# Patient Record
Sex: Female | Born: 1965 | Hispanic: No | Marital: Single | Smoking: Never smoker
Health system: Southern US, Community
[De-identification: ages and names within clinical notes are randomized; demographics above are authoritative.]

## PROBLEM LIST (undated history)

## (undated) ENCOUNTER — Emergency Department (HOSPITAL_COMMUNITY): Payer: 59

## (undated) DIAGNOSIS — I10 Essential (primary) hypertension: Secondary | ICD-10-CM

## (undated) DIAGNOSIS — R519 Headache, unspecified: Secondary | ICD-10-CM

## (undated) DIAGNOSIS — E785 Hyperlipidemia, unspecified: Secondary | ICD-10-CM

## (undated) DIAGNOSIS — N200 Calculus of kidney: Secondary | ICD-10-CM

## (undated) DIAGNOSIS — M1990 Unspecified osteoarthritis, unspecified site: Secondary | ICD-10-CM

## (undated) DIAGNOSIS — Z87442 Personal history of urinary calculi: Secondary | ICD-10-CM

## (undated) DIAGNOSIS — Z9289 Personal history of other medical treatment: Secondary | ICD-10-CM

## (undated) DIAGNOSIS — R51 Headache: Secondary | ICD-10-CM

## (undated) HISTORY — DX: Calculus of kidney: N20.0

## (undated) HISTORY — DX: Hyperlipidemia, unspecified: E78.5

## (undated) HISTORY — DX: Essential (primary) hypertension: I10

## (undated) HISTORY — PX: HERNIA REPAIR: SHX51

---

## 2000-12-05 HISTORY — PX: LIPOSUCTION: SHX10

## 2000-12-12 ENCOUNTER — Encounter (HOSPITAL_COMMUNITY): Admission: RE | Admit: 2000-12-12 | Discharge: 2001-01-15 | Payer: Self-pay | Admitting: *Deleted

## 2000-12-21 ENCOUNTER — Inpatient Hospital Stay (HOSPITAL_COMMUNITY): Admission: AD | Admit: 2000-12-21 | Discharge: 2000-12-21 | Payer: Self-pay | Admitting: Obstetrics

## 2001-01-06 ENCOUNTER — Inpatient Hospital Stay (HOSPITAL_COMMUNITY): Admission: AD | Admit: 2001-01-06 | Discharge: 2001-01-08 | Payer: Self-pay | Admitting: Obstetrics

## 2001-01-07 ENCOUNTER — Encounter: Payer: Self-pay | Admitting: Obstetrics

## 2001-01-09 ENCOUNTER — Inpatient Hospital Stay (HOSPITAL_COMMUNITY): Admission: AD | Admit: 2001-01-09 | Discharge: 2001-01-09 | Payer: Self-pay | Admitting: *Deleted

## 2001-01-11 ENCOUNTER — Inpatient Hospital Stay (HOSPITAL_COMMUNITY): Admission: AD | Admit: 2001-01-11 | Discharge: 2001-01-11 | Payer: Self-pay

## 2001-01-12 ENCOUNTER — Encounter (INDEPENDENT_AMBULATORY_CARE_PROVIDER_SITE_OTHER): Payer: Self-pay | Admitting: Specialist

## 2001-01-12 ENCOUNTER — Inpatient Hospital Stay (HOSPITAL_COMMUNITY): Admission: AD | Admit: 2001-01-12 | Discharge: 2001-01-15 | Payer: Self-pay | Admitting: Obstetrics

## 2001-01-17 ENCOUNTER — Inpatient Hospital Stay (HOSPITAL_COMMUNITY): Admission: AD | Admit: 2001-01-17 | Discharge: 2001-01-17 | Payer: Self-pay | Admitting: *Deleted

## 2007-08-13 ENCOUNTER — Emergency Department (HOSPITAL_COMMUNITY): Admission: EM | Admit: 2007-08-13 | Discharge: 2007-08-13 | Payer: Self-pay | Admitting: Emergency Medicine

## 2007-10-16 ENCOUNTER — Emergency Department (HOSPITAL_COMMUNITY): Admission: EM | Admit: 2007-10-16 | Discharge: 2007-10-17 | Payer: Self-pay | Admitting: Emergency Medicine

## 2009-07-10 ENCOUNTER — Emergency Department (HOSPITAL_COMMUNITY): Admission: EM | Admit: 2009-07-10 | Discharge: 2009-07-10 | Payer: Self-pay | Admitting: Emergency Medicine

## 2010-08-27 ENCOUNTER — Emergency Department (HOSPITAL_COMMUNITY): Admission: EM | Admit: 2010-08-27 | Discharge: 2010-08-27 | Payer: Self-pay | Admitting: Emergency Medicine

## 2011-03-16 ENCOUNTER — Other Ambulatory Visit (HOSPITAL_COMMUNITY): Payer: Self-pay | Admitting: Family Medicine

## 2011-03-16 ENCOUNTER — Other Ambulatory Visit: Payer: Self-pay | Admitting: Family Medicine

## 2011-03-16 DIAGNOSIS — Z1231 Encounter for screening mammogram for malignant neoplasm of breast: Secondary | ICD-10-CM

## 2011-03-22 ENCOUNTER — Ambulatory Visit (HOSPITAL_COMMUNITY)
Admission: RE | Admit: 2011-03-22 | Discharge: 2011-03-22 | Disposition: A | Discharge status: Home / Self Care | Payer: Medicaid Other | Source: Ambulatory Visit | Attending: Family Medicine | Admitting: Family Medicine

## 2011-03-22 DIAGNOSIS — Z1231 Encounter for screening mammogram for malignant neoplasm of breast: Secondary | ICD-10-CM | POA: Insufficient documentation

## 2011-04-22 NOTE — Discharge Summary (Signed)
[facility]  Patient:    Catherine Patrick, Catherine Patrick                         MRN: [id number] Adm. Date:  [id number] Disc. Date: [id number] Attending:  Antionette Char Dictator:   Zella Ball, M.D.                           Discharge Summary  DATE OF BIRTH:                [birth year]  DISCHARGE DIAGNOSES:          1. Intrauterine pregnancy at term, delivered.                               2. Fetal malpresentation--transverse lie.  PROCEDURES WHILE IN HOSPITAL:                     Low transverse cesarean section.  ADMISSION HISTORY:            This is a 45 year old G3, P2-0-02, at term who presents for a scheduled primary low transverse cesarean section due to fetal malpresentation. The patient had previously undergone two attempts at version of the fetus, which was verified to be in transverse lie by ultrasound, without success. Therefore, the patient was admitted for a C-section.  PHYSICAL EXAMINATION ON PRESENTATION:  VITAL SIGNS:                  Afebrile with normal vital signs.  GENERAL:                      Healthy female in no acute distress.  LUNGS:                        Clear to auscultation bilaterally. No rhonchi and wheezes.  CARDIAC:                      Regular rate and rhythm without murmurs, rubs, or gallops.  ABDOMEN:                      Soft, nontender. Positive bowel sounds. Gravid.  EXTREMITIES:                  Without clubbing, cyanosis, or edema.  SKIN:                         Within normal limits.  IMPRESSION AT ADMIT:          This was a 45 year old G3, P2-0-0-2 at term with intrauterine pregnancy in transverse lie, therefore, was admitted for cesarean section.  HOSPITAL COURSE:              On January 12, 2001 the patient underwent a low transverse cesarean section performed by Dr. Corky Sox assisted by Dr. Cyndia Bent and Dr. Candee Furbish. For a full discussion of that operation, please dictation of January 12, 2001. The  patient had an uncomplicated recovery and was discharged home on day three postoperatively.  FOLLOWUP:                     The patient is to return (1) in two days to the Maternity Admissions Unit for staple removal, and (2) to  return in six weeks to [facility].  DISCHARGE MEDICATIONS:        1. Micronor oral contraceptive pills.                               2. Prenatal vitamins q.d. while breast-feeding.                               3. Percocet for pain control                               4. Ibuprofen for pain control. DD:  01/15/01 TD:  01/15/01 Job: [id number]

## 2011-04-22 NOTE — Discharge Summary (Signed)
[facility]  Patient:    Catherine Patrick, Catherine Patrick                         MRN: [id number] Adm. Date:  [id number] Disc. Date: [id number] Attending:  Tammi Sou Dictator:   Zella Ball, M.D.                           Discharge Summary  DATE OF BIRTH:                June 11, 1966  DISCHARGE DIAGNOSES:          1. Intrauterine pregnancy, slightly postdates.                               2. Abnormal fetal presentation, transverse lie.  PROCEDURES WHILE IN HOSPITAL:                     Attempted fetal version x 1.  PRESENTING HISTORY:           This is a 45 year old, G3, P2-0-0-2, who presented at initially what we thought was 42 weeks 4 days gestation, last menstrual period March 28, 2000, the patient reporting an estimated due date of December 19, 2000, who came in complaining of vaginal pressure and burning with urination. The patient states she did no have contractions, no vaginal bleeding, no rupture of membranes, and could still feel fetal movement. The patient is a recent immigrant from [place], came to this country in December of last year and had had prenatal care in [place]. She had been seen twice previously at the maternity admissions unit, once by [facility] and another time she was seen, I believe, by Dr. Cyndia Bent.  OBSTETRICAL HISTORY:          The patient has had two prior normal spontaneous vaginal deliveries, one in 1994, one in 1998. Both of these, the patient stated, were postdates.  FAMILY AND SOCIAL HISTORY:    The patient is from [place]. Her husband is still in [place]. She is currently living her in [place] with friends. One of children is here, her younger child is here, and the oldest one is in [place] with the father.  PRENATAL LABORATORIES:        The patient is O positive blood type. Syphilis, HIV, Chlamydia, GBS negative on December 12, 2040.  INITIAL PRESENTATION:         The  patient was 1 cm dilated, about 50% effaced, and very, very posterior. Fetal heart rates were in the 130s and reactive with good variability. The patient was having contractions roughly every two to five minutes; however, was not feeling them.  Assessment at that time was that this was a postdates pregnancy and the patient was admitted for induction.  HOSPITAL COURSE:              The patient was started on low-dose Pitocin at 2 ______ per hour, as the patient began to feel her contractions, it was felt that Cervidil was not the most appropriate agent to use in this case. She was on Pitocin for roughly nine hours. The Pitocin continued without any change in the patients cervix and it was decided to go ahead and place the Cervidil which the patient had for a couple of hours with an increase  in her contractions to approximately every two minutes. It was felt that the patient was likely about to go into hyperstimulation; therefore, the Cervidil was pulled. On reexamining the patient in the morning, it was found that her cervix had not changed and no fetal presenting part could be felt in the pelvis. Therefore, a bedside ultrasound was done which showed the fetus to have a transverse presentation. She was then sent for full obstetrical ultrasound which showed the patient was indeed in a transverse lie; however, had within normal limits amniotic fluid and grade 2 placenta. Secondary to lie of the fetus, fetal version was attempted by Dr. Tamela Oddi without success. And, the patient was initially going to be sent home after this, but since she seemed to be contracting a little bit too much, she was kept for an additional day. Then, finally on day of discharge, January 08, 2001 the patients records, which initially could not be found, were found in the MAU and it was discovered that the patients due date was not January 15 but January 29, making her only 40 weeks and 5 days, rather than 42  weeks 5 days, which was our initial thought. Because of this, the patient was discharged home to see if the baby would turn on its own and scheduled to return to the MAU on Thursday, February 7 at 1:45 in order to have a second attempted version if the fetus has not moved itself, and then subsequent induction if the patient has not already gone into labor at that time.  DISCHARGE MEDICATIONS:        Prenatal vitamins.  FOLLOWUP:                     The patient is to return to the MAU on February 7, Thursday, at 1:45 for attempted version and induction. DD:  01/08/01 TD:  01/08/01 Job: [id number]

## 2011-04-22 NOTE — Op Note (Signed)
[facility]  Patient:    Catherine Patrick, Catherine Patrick                         MRN: [id number] Proc. Date: 01/12/01 Adm. Date:  [id number] Attending:  Tammi Sou Dictator:   Jamey Reas, M.D.                           Operative Report  DATE OF BIRTH:                06/15/1966  PREOPERATIVE DIAGNOSIS:       Term intrauterine pregnancy, transverse lie.  POSTOPERATIVE DIAGNOSIS:      Term intrauterine pregnancy, oblique lie.  PROCEDURE:                    Primary low transverse cesarean section via                               Pfannenstiel.  SURGEON:                      Conni Elliot, M.D.  ASSISTANT:                    Jamey Reas, M.D.                               Zella Ball, M.D.  FINDINGS:                     Viable female, Apgars 9 at one minute, 9 at five minutes, in oblique lie. Head delivered atraumatically at 1102 a.m. Cord pH 7.34. Nuchal cord x 2. Meconium-stained fluid.  COMPLICATIONS:                None.  INTRAVENOUS FLUIDS:           2600 cc lactated Ringers.  ANESTHESIA:                   Spinal.  ESTIMATED BLOOD LOSS:         800 cc.  URINE OUTPUT:                 425 cc, clear.  CONDITION:                    Stable.  DESCRIPTION OF PROCEDURE:     The patient was taken to the operating room where spinal anesthesia was introduced and found to be adequate. She was then prepped and draped in the normal sterile fashion in the dorsal supine position with a leftward tilt. A Pfannenstiel skin incision was then made with the scalpel and carried through to the underlying layer of fascia with the scalpel. The fascia was incised in the midline and the incision extended laterally with the Mayo scissors. Superior aspect of the fascial incision was then grasped with Kocher clamps, elevated, and the underlying rectus muscles dissected off bluntly. Attention was then turned to the inferior aspect of the incision  which, in a similar fashion, was grasped, tented up with the Kocher clamps, and the rectus muscles dissected off bluntly. The rectus muscles were then separated in the midline, the peritoneum identified, tented up and entered sharply with Metzenbaum scissors. The peritoneal incision was  then extended superiorly and inferiorly with good visualization of the bladder. The bladder blade was then inserted and the vesicouterine peritoneum identified, grasped with pickups, and entered sharply with Metzenbaum scissors. This incision was then extended laterally and the bladder flap created digitally. The bladder blade was then reinserted and the lower uterine segment incised in a transverse fashion with the scalpel. The uterine incision was then extended laterally with blunt dissection. The bladder blade was removed and the infants head delivered atraumatically. The nose and mouth were suctioned with the DeLee suction trap and the cord clamped and cut. The infant was handed off to the awaiting pediatricians and cord gases were sent. Cord pH was 7.27. The placenta was then removed manually, the uterus exteriorized and cleared of all clots and debris. The uterine incision was repaired with 1-0 chromic in a running lock fashion. The second layer of the same suture was used to obtain excellent hemostasis. The bladder flap was repaired with 2-0 chromic suture on a SH needle in a running stitch and the uterus returned to the abdomen. The gutters were cleared of all clots and irrigated thoroughly, and the peritoneum closed with 2-0 chromic suture.  The fascia was reapproximated with 0 Vicryl in a running fashion. The subcutaneous tissue was irrigated thoroughly. It was closed with running stitch of 2-0 plain gut on a large needle. The skin was closed with staples. The patient tolerated the procedure well. Sponge, lap, and needle counts were correct x 2. One gram of Cefotan was given at cord clamp. The  patient was taken to the recovery room in stable condition. DD:  01/12/01 TD:  01/12/01 Job: [id number]

## 2011-08-30 ENCOUNTER — Emergency Department (HOSPITAL_COMMUNITY)
Admission: EM | Admit: 2011-08-30 | Discharge: 2011-08-30 | Disposition: A | Discharge status: Home / Self Care | Payer: Self-pay | Attending: Emergency Medicine | Admitting: Emergency Medicine

## 2011-08-30 ENCOUNTER — Emergency Department (HOSPITAL_COMMUNITY): Payer: Self-pay

## 2011-08-30 DIAGNOSIS — S93609A Unspecified sprain of unspecified foot, initial encounter: Secondary | ICD-10-CM | POA: Insufficient documentation

## 2011-08-30 DIAGNOSIS — IMO0002 Reserved for concepts with insufficient information to code with codable children: Secondary | ICD-10-CM | POA: Insufficient documentation

## 2011-08-30 DIAGNOSIS — M79609 Pain in unspecified limb: Secondary | ICD-10-CM | POA: Insufficient documentation

## 2011-09-13 LAB — PREGNANCY, URINE
Preg Test, Ur: NEGATIVE
Preg Test, Ur: NEGATIVE

## 2011-09-13 LAB — URINALYSIS, ROUTINE W REFLEX MICROSCOPIC
Bilirubin Urine: NEGATIVE
Glucose, UA: NEGATIVE
Nitrite: NEGATIVE
Specific Gravity, Urine: 1.022
pH: 6

## 2011-09-13 LAB — URINE MICROSCOPIC-ADD ON

## 2011-09-13 LAB — URINE CULTURE

## 2011-12-31 ENCOUNTER — Emergency Department (HOSPITAL_COMMUNITY)
Admission: EM | Admit: 2011-12-31 | Discharge: 2011-12-31 | Disposition: A | Discharge status: Home / Self Care | Payer: Self-pay | Attending: Emergency Medicine | Admitting: Emergency Medicine

## 2011-12-31 ENCOUNTER — Emergency Department (HOSPITAL_COMMUNITY): Payer: Self-pay

## 2011-12-31 ENCOUNTER — Encounter (HOSPITAL_COMMUNITY): Payer: Self-pay | Admitting: *Deleted

## 2011-12-31 DIAGNOSIS — W2203XA Walked into furniture, initial encounter: Secondary | ICD-10-CM | POA: Insufficient documentation

## 2011-12-31 DIAGNOSIS — M79609 Pain in unspecified limb: Secondary | ICD-10-CM | POA: Insufficient documentation

## 2011-12-31 DIAGNOSIS — S92502A Displaced unspecified fracture of left lesser toe(s), initial encounter for closed fracture: Secondary | ICD-10-CM

## 2011-12-31 DIAGNOSIS — S92919A Unspecified fracture of unspecified toe(s), initial encounter for closed fracture: Secondary | ICD-10-CM | POA: Insufficient documentation

## 2011-12-31 MED ORDER — HYDROCODONE-ACETAMINOPHEN 5-325 MG PO TABS
1.0000 | ORAL_TABLET | Freq: Once | ORAL | Status: AC
  Administered 2011-12-31: 1 via ORAL
  Filled 2011-12-31: qty 1

## 2011-12-31 MED ORDER — HYDROCODONE-ACETAMINOPHEN 5-325 MG PO TABS: 1.0000 | ORAL_TABLET | Freq: Once | ORAL | Status: AC

## 2011-12-31 NOTE — ED Notes (Signed)
Pt from home c/o left, 5th toe pain after hitting over end table, deformity and swelling noted.

## 2011-12-31 NOTE — ED Provider Notes (Signed)
History     CSN: [id number]  Arrival date & time 12/31/11  1437   First MD Initiated Contact with Patient 12/31/11 1630      4:57 PM HPI Pt reports she hit her left 5th toe on a table. Reports severe pain and difficulty walking on it. Denies wound or laceration.  Patient is a 46 y.o. female presenting with toe pain. The history is provided by the patient.  Toe Pain This is a new problem. The current episode started today. The problem occurs constantly. The problem has been unchanged. Associated symptoms include joint swelling. Pertinent negatives include no numbness or weakness. The symptoms are aggravated by walking and standing. She has tried nothing for the symptoms.    History reviewed. No pertinent past medical history.  Past Surgical History  Procedure Date  . Hernia repair     History reviewed. No pertinent family history.  History  Substance Use Topics  . Smoking status: Never Smoker   . Smokeless tobacco: Never Used  . Alcohol Use: No    OB History    Grav Para Term Preterm Abortions TAB SAB Ect Mult Living                  Review of Systems  Musculoskeletal: Positive for joint swelling.       Toe pain and swelling   Skin: Negative for wound.  Neurological: Negative for weakness and numbness.  All other systems reviewed and are negative.    Allergies  Review of patient's allergies indicates no known allergies.  Home Medications  No current outpatient prescriptions on file.  BP 139/90  Pulse 79  Temp(Src) 97.9 F (36.6 C) (Oral)  Resp 18  Ht 4' 10.27" (1.48 m)  Wt 162 lb (73.483 kg)  BMI 33.55 kg/m2  SpO2 99%  LMP 11/24/2011  Physical Exam  Vitals reviewed. Constitutional: She is oriented to person, place, and time. Vital signs are normal. She appears well-developed and well-nourished. No distress.  HENT:  Head: Normocephalic and atraumatic.  Eyes: Pupils are equal, round, and reactive to light.  Neck: Neck supple.  Pulmonary/Chest:  Effort normal.  Musculoskeletal:       Left 5th toe tenderness with Palpation. Able to wiggle toes slightly but patient is uncooperative exam and does not allow me to check cap refill or sensation. Mild edema and slight lateral deviation. Pt reports normal sensation.  Neurological: She is alert and oriented to person, place, and time.  Skin: Skin is warm and dry. No rash noted. No erythema. No pallor.  Psychiatric: She has a normal mood and affect. Her behavior is normal.    ED Course  Procedures  Dg Toe 5th Left  12/31/2011  *RADIOLOGY REPORT*  Clinical Data: Left fifth digit injury  DG TOE 5TH LEFT  Comparison: Plain film 08/30/2011  Findings: There is a horizontal fracture through the metaphysis of the proximal phalanx of the fifth digit with lateral angulation. Fracture does not appear to enter the articular surface.  IMPRESSION: Fracture of proximal phalanx of the fifth digit.  Original Report Authenticated By: Genevive Bi, M.D.    MDM  Will place patient in a post-op shoe and buddy tape toes. Pt refusing crutches. States she has crutches at home. Will also provide referral to Dr. Selena Batten, PA-C 12/31/11 1732

## 2012-01-01 NOTE — ED Provider Notes (Signed)
Medical screening examination/treatment/procedure(s) were performed by non-physician practitioner and as supervising physician I was immediately available for consultation/collaboration.  Brandye Inthavong, MD 01/01/12 1035 

## 2013-01-01 ENCOUNTER — Emergency Department (HOSPITAL_BASED_OUTPATIENT_CLINIC_OR_DEPARTMENT_OTHER)
Admission: EM | Admit: 2013-01-01 | Discharge: 2013-01-01 | Disposition: A | Discharge status: Home / Self Care | Payer: No Typology Code available for payment source | Attending: Emergency Medicine | Admitting: Emergency Medicine

## 2013-01-01 ENCOUNTER — Emergency Department (HOSPITAL_BASED_OUTPATIENT_CLINIC_OR_DEPARTMENT_OTHER): Payer: No Typology Code available for payment source

## 2013-01-01 ENCOUNTER — Encounter (HOSPITAL_BASED_OUTPATIENT_CLINIC_OR_DEPARTMENT_OTHER): Payer: Self-pay

## 2013-01-01 DIAGNOSIS — R51 Headache: Secondary | ICD-10-CM | POA: Insufficient documentation

## 2013-01-01 LAB — COMPREHENSIVE METABOLIC PANEL
BUN: 15 mg/dL (ref 6–23)
CO2: 26 mEq/L (ref 19–32)
Chloride: 104 mEq/L (ref 96–112)
Creatinine, Ser: 0.7 mg/dL (ref 0.50–1.10)
GFR calc Af Amer: >90 mL/min (ref >90)
GFR calc non Af Amer: >90 mL/min (ref >90)
Glucose, Bld: 108 mg/dL — ABNORMAL HIGH (ref 70–99)
Total Bilirubin: 0.1 mg/dL — ABNORMAL LOW (ref 0.3–1.2)

## 2013-01-01 LAB — CBC WITH DIFFERENTIAL/PLATELET
HCT: 43.3 % (ref 36.0–46.0)
Hemoglobin: 14.3 g/dL (ref 12.0–15.0)
Lymphocytes Relative: 33 % (ref 12–46)
MCV: 90 fL (ref 78.0–100.0)
Monocytes Absolute: 0.7 10*3/uL (ref 0.1–1.0)
Monocytes Relative: 8 % (ref 3–12)
Neutro Abs: 4.4 10*3/uL (ref 1.7–7.7)
WBC: 7.8 10*3/uL (ref 4.0–10.5)

## 2013-01-01 LAB — PREGNANCY, URINE: Preg Test, Ur: NEGATIVE

## 2013-01-01 LAB — SEDIMENTATION RATE: Sed Rate: 10 mm/hr (ref 0–22)

## 2013-01-01 MED ORDER — SODIUM CHLORIDE 0.9 % IV BOLUS (SEPSIS)
1000.0000 mL | Freq: Once | INTRAVENOUS | Status: AC
  Administered 2013-01-01: 1000 mL via INTRAVENOUS

## 2013-01-01 MED ORDER — CYCLOBENZAPRINE HCL 10 MG PO TABS: 10.0000 mg | ORAL_TABLET | Freq: Two times a day (BID) | ORAL | Status: DC | PRN

## 2013-01-01 MED ORDER — METOCLOPRAMIDE HCL 10 MG PO TABS: 10.0000 mg | ORAL_TABLET | Freq: Four times a day (QID) | ORAL | Status: DC | PRN

## 2013-01-01 MED ORDER — METOCLOPRAMIDE HCL 5 MG/ML IJ SOLN
10.0000 mg | Freq: Once | INTRAMUSCULAR | Status: AC
  Administered 2013-01-01: 10 mg via INTRAVENOUS
  Filled 2013-01-01: qty 2

## 2013-01-01 MED ORDER — DIPHENHYDRAMINE HCL 50 MG/ML IJ SOLN
25.0000 mg | Freq: Once | INTRAMUSCULAR | Status: AC
  Administered 2013-01-01: 18:00:00 via INTRAVENOUS
  Filled 2013-01-01: qty 1

## 2013-01-01 NOTE — ED Provider Notes (Signed)
History     CSN: [id number]  Arrival date & time 01/01/13  1623   First MD Initiated Contact with Patient 01/01/13 1646      Chief Complaint  Patient presents with  . Headache    (Consider location/radiation/quality/duration/timing/severity/associated sxs/prior treatment) Patient is a 47 y.o. female presenting with headaches. The history is provided by the patient.  Headache   She complains of a left-sided headache for the last month. It is getting worse. Pain is rated at 10/10 except when she takes analgesics at which time the pain drops to 5/10. Pain is constant she describes as as dull and throbbing. There has been intermittent blurring of vision. She denies fever or chills. She denies nausea or vomiting. She denies photophobia and phonophobia. Headache does interfere with sleep and does wake her up. She has taken a variety of over-the-counter pain medications each which gave partial relief.  History reviewed. No pertinent past medical history.  Past Surgical History  Procedure Date  . Hernia repair     No family history on file.  History  Substance Use Topics  . Smoking status: Never Smoker   . Smokeless tobacco: Never Used  . Alcohol Use: No    OB History    Grav Para Term Preterm Abortions TAB SAB Ect Mult Living                  Review of Systems  Neurological: Positive for headaches.  All other systems reviewed and are negative.    Allergies  Review of patient's allergies indicates no known allergies.  Home Medications  No current outpatient prescriptions on file.  BP 135/91  Pulse 84  Temp 98.3 F (36.8 C) (Oral)  Resp 18  Wt 170 lb (77.111 kg)  SpO2 98%  LMP 11/04/2012  Physical Exam  Nursing note and vitals reviewed.  47 year old female, resting comfortably and in no acute distress. Vital signs are significant for hypertension with blood pressure 162/110. Oxygen saturation is 100%, which is normal. Head is normocephalic and atraumatic.  PERRLA, EOMI. Oropharynx is clear.fundi show no hemorrhage, exudate, or papilledema. There is tenderness to palpation over the temporalis muscle bilaterally and over the left temporal artery. There's also tenderness to palpation over the insertion of the paracervical muscles. Neck is nontender and supple without adenopathy or JVD. Back is nontender and there is no CVA tenderness. Lungs are clear without rales, wheezes, or rhonchi. Chest is nontender. Heart has regular rate and rhythm without murmur. Abdomen is soft, flat, nontender without masses or hepatosplenomegaly and peristalsis is normoactive. Extremities have no cyanosis or edema, full range of motion is present. Skin is warm and dry without rash. Neurologic: Mental status is normal, cranial nerves are intact, there are no motor or sensory deficits.  ED Course  Procedures (including critical care time)  Results for orders placed during the hospital encounter of 01/01/13  CBC WITH DIFFERENTIAL      Component Value Range   WBC 7.8  4.0 - 10.5 K/uL   RBC 4.81  3.87 - 5.11 MIL/uL   Hemoglobin 14.3  12.0 - 15.0 g/dL   HCT 78.2  95.6 - 21.3 %   MCV 90.0  78.0 - 100.0 fL   MCH 29.7  26.0 - 34.0 pg   MCHC 33.0  30.0 - 36.0 g/dL   RDW 08.6  57.8 - 46.9 %   Platelets 207  150 - 400 K/uL   Neutrophils Relative 57  43 - 77 %  Neutro Abs 4.4  1.7 - 7.7 K/uL   Lymphocytes Relative 33  12 - 46 %   Lymphs Abs 2.6  0.7 - 4.0 K/uL   Monocytes Relative 8  3 - 12 %   Monocytes Absolute 0.7  0.1 - 1.0 K/uL   Eosinophils Relative 1  0 - 5 %   Eosinophils Absolute 0.1  0.0 - 0.7 K/uL   Basophils Relative 0  0 - 1 %   Basophils Absolute 0.0  0.0 - 0.1 K/uL  COMPREHENSIVE METABOLIC PANEL      Component Value Range   Sodium 141  135 - 145 mEq/L   Potassium 4.1  3.5 - 5.1 mEq/L   Chloride 104  96 - 112 mEq/L   CO2 26  19 - 32 mEq/L   Glucose, Bld 108 (*) 70 - 99 mg/dL   BUN 15  6 - 23 mg/dL   Creatinine, Ser 1.61  0.50 - 1.10 mg/dL    Calcium 9.8  8.4 - 09.6 mg/dL   Total Protein 7.1  6.0 - 8.3 g/dL   Albumin 3.5  3.5 - 5.2 g/dL   AST 18  0 - 37 U/L   ALT 19  0 - 35 U/L   Alkaline Phosphatase 121 (*) 39 - 117 U/L   Total Bilirubin 0.1 (*) 0.3 - 1.2 mg/dL   GFR calc non Af Amer >90  >90 mL/min   GFR calc Af Amer >90  >90 mL/min  SEDIMENTATION RATE      Component Value Range   Sed Rate 10  0 - 22 mm/hr  PREGNANCY, URINE      Component Value Range   Preg Test, Ur NEGATIVE  NEGATIVE   Ct Head Wo Contrast  01/01/2013  *RADIOLOGY REPORT*  Clinical Data: Left sided headache. No injury.  CT HEAD WITHOUT CONTRAST  Technique:  Contiguous axial images were obtained from the base of the skull through the vertex without contrast.  Comparison: None.  Findings: There is no evidence for acute infarction, intracranial hemorrhage, mass lesion, hydrocephalus, or extra-axial fluid. There is no atrophy or white matter disease.  The calvarium is intact.  The paranasal sinuses and mastoids are clear.  IMPRESSION: Negative exam.   Original Report Authenticated By: Davonna Belling, M.D.    Images viewed by me.  1. Headache       MDM  Headache of uncertain cause. Clinicity and unilateral nature her worrisome. Although she is fairly young, need to consider possibility of temporal arteritis and sedimentation rate has been sent. She will be sent for CT scan.  Workup is negative including normal CT scan and normal sedimentation rate. She was given IV fluids, IV metoclopramide, IV diphenhydramine with good relief of her headache. At this point, it seems most likely that the headache is right muscle contraction headache. She is sent home with prescription for bromide and cyclobenzaprine and is referred to [facility] for further workup if symptoms are not improved with the above-noted medication.  Dione Booze, MD 01/01/13 [id number]

## 2013-01-01 NOTE — ED Notes (Signed)
C/o HA x 1 month

## 2013-01-21 ENCOUNTER — Encounter (HOSPITAL_COMMUNITY): Payer: Self-pay

## 2013-01-21 ENCOUNTER — Emergency Department (INDEPENDENT_AMBULATORY_CARE_PROVIDER_SITE_OTHER)
Admission: EM | Admit: 2013-01-21 | Discharge: 2013-01-21 | Disposition: A | Discharge status: Home / Self Care | Payer: No Typology Code available for payment source | Source: Home / Self Care | Attending: Family Medicine | Admitting: Family Medicine

## 2013-01-21 DIAGNOSIS — I10 Essential (primary) hypertension: Secondary | ICD-10-CM

## 2013-01-21 DIAGNOSIS — R51 Headache: Secondary | ICD-10-CM

## 2013-01-21 MED ORDER — CETIRIZINE HCL 10 MG PO TABS: 10.0000 mg | ORAL_TABLET | Freq: Every day | ORAL | Status: DC

## 2013-01-21 MED ORDER — LISINOPRIL-HYDROCHLOROTHIAZIDE 10-12.5 MG PO TABS: 1.0000 | ORAL_TABLET | Freq: Every day | ORAL | Status: DC

## 2013-01-21 NOTE — ED Provider Notes (Signed)
History    CSN: [id number]  Arrival date & time 01/21/13  1748   First MD Initiated Contact with Patient 01/21/13 1858     Chief Complaint  Patient presents with  . Headache   The history is provided by the patient.   Pt is here to follow up from recent ER visit for headache.  Pt says that she was diagnosed with tension headaches.  Her blood pressure was also found to be elevated at the time.  She didn't get any treatment for her hypertension at that time.  She is reporting today that she's continuing to have headaches.  She reports that they come on throughout the day and sometimes at night.  The patient reports that she's had no nausea or vomiting.  No photophobia.  The patient reports that she has no dizziness.  The patient denies frequent urination.  She also had an elevated blood sugar when they did some blood work on her in the emergency department.  She reports that she has no history of prediabetes or diabetes mellitus.  The patient reports that she has been having some allergy symptoms as well.  She is having some postnasal drainage.  The patient reports that she has not had a period in the last 2 months.  She reports that she had a negative pregnancy test at home.  History reviewed. No pertinent past medical history.  Past Surgical History  Procedure Laterality Date  . Hernia repair      No family history on file.  History  Substance Use Topics  . Smoking status: Never Smoker   . Smokeless tobacco: Never Used  . Alcohol Use: No    OB History   Grav Para Term Preterm Abortions TAB SAB Ect Mult Living                 Review of Systems  Constitutional: Positive for fatigue.  HENT: Positive for congestion, rhinorrhea and postnasal drip.   Endocrine: Negative for polyuria.  Neurological: Positive for headaches.  All other systems reviewed and are negative.   Allergies  Review of patient's allergies indicates no known allergies.  Home Medications   Current  Outpatient Rx  Name  Route  Sig  Dispense  Refill  . cyclobenzaprine (FLEXERIL) 10 MG tablet   Oral   Take 1 tablet (10 mg total) by mouth 2 (two) times daily as needed for muscle spasms.   20 tablet   0   . metoCLOPramide (REGLAN) 10 MG tablet   Oral   Take 1 tablet (10 mg total) by mouth every 6 (six) hours as needed (nausea or headache).   30 tablet   0     BP 148/106  Pulse 89  Temp(Src) 98.2 F (36.8 C) (Oral)  SpO2 100%  LMP 11/04/2012  Physical Exam  Nursing note and vitals reviewed. Constitutional: She is oriented to person, place, and time. She appears well-developed and well-nourished. No distress.  HENT:  Head: Normocephalic and atraumatic.  Right Ear: External ear normal.  Left Ear: External ear normal.  Nose: Mucosal edema and rhinorrhea present.  Eyes: Conjunctivae and EOM are normal. Pupils are equal, round, and reactive to light. Left eye exhibits no discharge.  Neck: Normal range of motion. Neck supple. No JVD present. No tracheal deviation present. No thyromegaly present.  Cardiovascular: Normal rate, regular rhythm and normal heart sounds.   No murmur heard. Pulmonary/Chest: Effort normal and breath sounds normal. No respiratory distress. She has no wheezes. She has  no rales. She exhibits no tenderness.  Abdominal: Soft. Bowel sounds are normal. She exhibits no distension and no mass. There is no tenderness. There is no rebound and no guarding.  Musculoskeletal: Normal range of motion. She exhibits no edema and no tenderness.  Lymphadenopathy:    She has no cervical adenopathy.  Neurological: She is alert and oriented to person, place, and time. She displays normal reflexes. No cranial nerve deficit. She exhibits normal muscle tone. Coordination normal.  Skin: Skin is warm and dry. No rash noted. No erythema. No pallor.  Psychiatric: She has a normal mood and affect. Her behavior is normal. Judgment and thought content normal.    ED Course  Procedures  (including critical care time)  Labs Reviewed  HEMOGLOBIN A1C   No results found.  No diagnosis found.  MDM  IMPRESSION  Hypertension  Headaches  Hyperglycemia  Dysmenorrhea  RECOMMENDATIONS / PLAN Treat hypertension: Start zestoretic 10/12.5 - take 1 po daily-possible side effects reviewed with patient today Check labs today : I like to check an A1c to be sure that she does not have prediabetes. I gave the patient information on diet and exercise regarding hypertension. I asked the patient to keep a headache diary and to monitor her diet consumption, headache frequency, timing, menstrual cycles  FOLLOW UP 2 weeks for blood pressure followup and to assess headaches and check headache diary  The patient was given clear instructions to go to ER or return to medical center if symptoms don't improve, worsen or new problems develop.  The patient verbalized understanding.  The patient was told to call to get lab results if they haven't heard anything in the next week.            Cleora Fleet, MD 01/21/13 2036

## 2013-01-21 NOTE — ED Notes (Signed)
Patient states went to the ed 01/01/2013 for symptoms associated with a headache Doctor( neuro) who the ed referred her to does not accept the orange card Today still is having headache

## 2013-01-22 ENCOUNTER — Telehealth (HOSPITAL_COMMUNITY): Payer: Self-pay

## 2013-01-22 NOTE — Progress Notes (Signed)
Quick Note:  Please notify patient that her hemoglobin A1c came back elevated at 5.8% which suggests she does have prediabetes. Please mail the patient information about prediabetes and exercise and physical activity. I like for the patient to exercise 150 minutes per week to try and avoid becoming a full diabetic. I'd like to have her labs rechecked again in 3 months.  Rodney Langton, MD, CDE, FAAFP [facility] [place], [place]   ______

## 2013-03-20 ENCOUNTER — Encounter (HOSPITAL_COMMUNITY): Payer: Self-pay | Admitting: Emergency Medicine

## 2013-03-20 ENCOUNTER — Emergency Department (INDEPENDENT_AMBULATORY_CARE_PROVIDER_SITE_OTHER)
Admission: EM | Admit: 2013-03-20 | Discharge: 2013-03-20 | Disposition: A | Discharge status: Home / Self Care | Payer: No Typology Code available for payment source | Source: Home / Self Care | Attending: Emergency Medicine | Admitting: Emergency Medicine

## 2013-03-20 DIAGNOSIS — J069 Acute upper respiratory infection, unspecified: Secondary | ICD-10-CM

## 2013-03-20 DIAGNOSIS — J209 Acute bronchitis, unspecified: Secondary | ICD-10-CM

## 2013-03-20 MED ORDER — ALBUTEROL SULFATE HFA 108 (90 BASE) MCG/ACT IN AERS: 1.0000 | INHALATION_SPRAY | Freq: Four times a day (QID) | RESPIRATORY_TRACT | Status: DC | PRN

## 2013-03-20 MED ORDER — PREDNISONE 20 MG PO TABS: 20.0000 mg | ORAL_TABLET | Freq: Two times a day (BID) | ORAL | Status: DC

## 2013-03-20 MED ORDER — AMOXICILLIN 500 MG PO CAPS: 1000.0000 mg | ORAL_CAPSULE | Freq: Three times a day (TID) | ORAL | Status: DC

## 2013-03-20 MED ORDER — ACETAMINOPHEN 325 MG PO TABS
ORAL_TABLET | ORAL | Status: AC
  Filled 2013-03-20: qty 2

## 2013-03-20 MED ORDER — BENZONATATE 200 MG PO CAPS: 200.0000 mg | ORAL_CAPSULE | Freq: Three times a day (TID) | ORAL | Status: DC | PRN

## 2013-03-20 MED ORDER — ACETAMINOPHEN 325 MG PO TABS
650.0000 mg | ORAL_TABLET | Freq: Once | ORAL | Status: AC
  Administered 2013-03-20: 650 mg via ORAL

## 2013-03-20 NOTE — ED Provider Notes (Signed)
Chief Complaint:   Chief Complaint  Patient presents with  . Headache    History of Present Illness:   Hera Celaya is a 47 year old female who presents with a one-week history of cough productive yellow sputum, wheezing, chest pain when she coughs, subjective fever, chills, sweats, headache, nasal congestion with yellow rhinorrhea, sinus pressure, abdominal pain, ear congestion, and sore throat. She denies pleuritic chest pain, vomiting, or diarrhea.  Review of Systems:  Other than noted above, the patient denies any of the following symptoms: Systemic:  No fevers, chills, sweats, weight loss or gain, fatigue, or tiredness. Eye:  No redness or discharge. ENT:  No ear pain, drainage, headache, nasal congestion, drainage, sinus pressure, difficulty swallowing, or sore throat. Neck:  No neck pain or swollen glands. Lungs:  No cough, sputum production, hemoptysis, wheezing, chest tightness, shortness of breath or chest pain. GI:  No abdominal pain, nausea, vomiting or diarrhea.  PMFSH:  Past medical history, family history, social history, meds, and allergies were reviewed.  Physical Exam:   Vital signs:  BP 134/84  Pulse 110  Temp(Src) 98.4 F (36.9 C) (Oral)  Resp 20  SpO2 98% General:  Alert and oriented.  In no distress.  Skin warm and dry. Eye:  No conjunctival injection or drainage. Lids were normal. ENT:  TMs and canals were normal, without erythema or inflammation.  Nasal mucosa was clear and uncongested, without drainage.  Mucous membranes were moist.  Pharynx was clear with no exudate or drainage.  There were no oral ulcerations or lesions. Neck:  Supple, no adenopathy, tenderness or mass. Lungs:  No respiratory distress.  Lungs were clear to auscultation, without wheezes, rales or rhonchi.  Breath sounds were clear and equal bilaterally.  Heart:  Regular rhythm, without gallops, murmers or rubs. Skin:  Clear, warm, and dry, without rash or lesions.  Assessment:  The primary  encounter diagnosis was Viral upper respiratory infection. A diagnosis of Acute bronchitis was also pertinent to this visit.  No evidence for pneumonia.  Plan:   1.  The following meds were prescribed:   New Prescriptions   ALBUTEROL (PROVENTIL HFA;VENTOLIN HFA) 108 (90 BASE) MCG/ACT INHALER    Inhale 1-2 puffs into the lungs every 6 (six) hours as needed for wheezing.   AMOXICILLIN (AMOXIL) 500 MG CAPSULE    Take 2 capsules (1,000 mg total) by mouth 3 (three) times daily.   BENZONATATE (TESSALON) 200 MG CAPSULE    Take 1 capsule (200 mg total) by mouth 3 (three) times daily as needed for cough.   PREDNISONE (DELTASONE) 20 MG TABLET    Take 1 tablet (20 mg total) by mouth 2 (two) times daily.   2.  The patient was instructed in symptomatic care and handouts were given. 3.  The patient was told to return if becoming worse in any way, if no better in 3 or 4 days, and given some red flag symptoms such as worsening fever, increasing pain, or difficulty breathing that would indicate earlier return.      Reuben Likes, MD 03/20/13 1059

## 2013-03-20 NOTE — ED Notes (Signed)
Fever and headache and cough, onset Thursday.  Evaluated by dr Lorenz Coaster prior to this nurse

## 2013-05-28 ENCOUNTER — Emergency Department (HOSPITAL_COMMUNITY): Payer: No Typology Code available for payment source

## 2013-05-28 ENCOUNTER — Emergency Department (HOSPITAL_COMMUNITY)
Admission: EM | Admit: 2013-05-28 | Discharge: 2013-05-28 | Disposition: A | Discharge status: Home / Self Care | Payer: No Typology Code available for payment source | Attending: Emergency Medicine | Admitting: Emergency Medicine

## 2013-05-28 ENCOUNTER — Encounter (HOSPITAL_COMMUNITY): Payer: Self-pay | Admitting: *Deleted

## 2013-05-28 DIAGNOSIS — Z3202 Encounter for pregnancy test, result negative: Secondary | ICD-10-CM | POA: Insufficient documentation

## 2013-05-28 DIAGNOSIS — R112 Nausea with vomiting, unspecified: Secondary | ICD-10-CM | POA: Insufficient documentation

## 2013-05-28 DIAGNOSIS — N200 Calculus of kidney: Secondary | ICD-10-CM | POA: Insufficient documentation

## 2013-05-28 DIAGNOSIS — Z8719 Personal history of other diseases of the digestive system: Secondary | ICD-10-CM | POA: Insufficient documentation

## 2013-05-28 LAB — POCT PREGNANCY, URINE: Preg Test, Ur: NEGATIVE

## 2013-05-28 LAB — CBC WITH DIFFERENTIAL/PLATELET
Basophils Absolute: 0 10*3/uL (ref 0.0–0.1)
Basophils Relative: 0 % (ref 0–1)
Eosinophils Relative: 1 % (ref 0–5)
HCT: 39 % (ref 36.0–46.0)
Lymphocytes Relative: 40 % (ref 12–46)
MCHC: 32.3 g/dL (ref 30.0–36.0)
MCV: 89.4 fL (ref 78.0–100.0)
Monocytes Absolute: 0.8 10*3/uL (ref 0.1–1.0)
Platelets: 190 10*3/uL (ref 150–400)
RDW: 14 % (ref 11.5–15.5)
WBC: 10.6 10*3/uL — ABNORMAL HIGH (ref 4.0–10.5)

## 2013-05-28 LAB — COMPREHENSIVE METABOLIC PANEL
ALT: 24 U/L (ref 0–35)
AST: 19 U/L (ref 0–37)
Albumin: 3.2 g/dL — ABNORMAL LOW (ref 3.5–5.2)
CO2: 24 mEq/L (ref 19–32)
Calcium: 8.9 mg/dL (ref 8.4–10.5)
Creatinine, Ser: 0.87 mg/dL (ref 0.50–1.10)
Sodium: 140 mEq/L (ref 135–145)
Total Protein: 6.8 g/dL (ref 6.0–8.3)

## 2013-05-28 LAB — URINE MICROSCOPIC-ADD ON

## 2013-05-28 LAB — URINALYSIS, ROUTINE W REFLEX MICROSCOPIC
Bilirubin Urine: NEGATIVE
Glucose, UA: NEGATIVE mg/dL
Specific Gravity, Urine: 1.024 (ref 1.005–1.030)
Urobilinogen, UA: 0.2 mg/dL (ref 0.0–1.0)
pH: 5 (ref 5.0–8.0)

## 2013-05-28 MED ORDER — ONDANSETRON HCL 4 MG/2ML IJ SOLN
4.0000 mg | Freq: Once | INTRAMUSCULAR | Status: AC
  Administered 2013-05-28: 4 mg via INTRAVENOUS
  Filled 2013-05-28: qty 2

## 2013-05-28 MED ORDER — ONDANSETRON HCL 4 MG PO TABS: 4.0000 mg | ORAL_TABLET | Freq: Four times a day (QID) | ORAL | Status: DC

## 2013-05-28 MED ORDER — OXYCODONE-ACETAMINOPHEN 5-325 MG PO TABS: 1.0000 | ORAL_TABLET | ORAL | Status: DC | PRN

## 2013-05-28 MED ORDER — MORPHINE SULFATE 4 MG/ML IJ SOLN
INTRAMUSCULAR | Status: AC
  Administered 2013-05-28: 4 mg via INTRAVENOUS
  Filled 2013-05-28: qty 1

## 2013-05-28 MED ORDER — KETOROLAC TROMETHAMINE 30 MG/ML IJ SOLN
30.0000 mg | Freq: Once | INTRAMUSCULAR | Status: AC
  Administered 2013-05-28: 30 mg via INTRAVENOUS
  Filled 2013-05-28: qty 1

## 2013-05-28 MED ORDER — MORPHINE SULFATE 4 MG/ML IJ SOLN
4.0000 mg | Freq: Once | INTRAMUSCULAR | Status: AC
  Administered 2013-05-28: 4 mg via INTRAVENOUS
  Filled 2013-05-28: qty 1

## 2013-05-28 MED ORDER — MORPHINE SULFATE 4 MG/ML IJ SOLN: 4.0000 mg | Freq: Once | INTRAMUSCULAR | Status: DC

## 2013-05-28 NOTE — ED Notes (Signed)
Patient transported to CT 

## 2013-05-28 NOTE — Progress Notes (Signed)
[facility] has seen patient. Patient does have a orange card and is currently a patient at [facility] and [facility].

## 2013-05-28 NOTE — ED Notes (Signed)
Pt c/o right flank pain and abd pain with n/v. Pt states pain started about 0330. Pt states he has hx of kidney stones.

## 2013-05-28 NOTE — ED Provider Notes (Signed)
History    CSN: [id number] Arrival date & time 05/28/13  4098  First MD Initiated Contact with Patient 05/28/13 [phone]     Chief Complaint  Patient presents with  . Flank Pain  . Abdominal Pain   (Consider location/radiation/quality/duration/timing/severity/associated sxs/prior Treatment) Patient is a 47 y.o. female presenting with flank pain and abdominal pain. The history is provided by the patient.  Flank Pain Associated symptoms include abdominal pain, nausea and vomiting. Pertinent negatives include no fever. Associated symptoms comments: Sudden onset flank pain that radiates into abdomen that woke her from sleep this morning. She reports a history of kidney stones with similar symptoms. Nausea with vomiting, no hematemesis..  Abdominal Pain Associated symptoms include abdominal pain, nausea and vomiting. Pertinent negatives include no fever.   History reviewed. No pertinent past medical history. Past Surgical History  Procedure Laterality Date  . Hernia repair    . Cesarean section     History reviewed. No pertinent family history. History  Substance Use Topics  . Smoking status: Never Smoker   . Smokeless tobacco: Never Used  . Alcohol Use: No   OB History   Grav Para Term Preterm Abortions TAB SAB Ect Mult Living                 Review of Systems  Constitutional: Negative for fever.  Gastrointestinal: Positive for nausea, vomiting and abdominal pain.  Genitourinary: Positive for flank pain. Negative for hematuria.    Allergies  Review of patient's allergies indicates no known allergies.  Home Medications   Current Outpatient Rx  Name  Route  Sig  Dispense  Refill  . aspirin 325 MG tablet   Oral   Take 325 mg by mouth every 4 (four) hours as needed for pain (pain).         Marland Kitchen acetaminophen (TYLENOL) 325 MG tablet   Oral   Take 650 mg by mouth every 6 (six) hours as needed for pain.          BP 163/92  Pulse 101  Temp(Src) 98.9 F (37.2 C) (Oral)   Resp 18  SpO2 100%  LMP 04/22/2013 Physical Exam  Constitutional: She is oriented to person, place, and time. She appears well-developed and well-nourished. No distress.  Pulmonary/Chest: Effort normal. She has no wheezes. She has no rales.  Abdominal: Soft. She exhibits no mass. There is tenderness.  Right sided abdominal tenderness. NO guarding or rebound. No palpable masses.  Genitourinary:  Right CVA tenderness.  Musculoskeletal: Normal range of motion. She exhibits no edema.  Neurological: She is alert and oriented to person, place, and time.  Skin: Skin is warm and dry.  Psychiatric: She has a normal mood and affect.    ED Course  Procedures (including critical care time) Labs Reviewed  CBC WITH DIFFERENTIAL - Abnormal; Notable for the following:    WBC 10.6 (*)    Lymphs Abs 4.2 (*)    All other components within normal limits  COMPREHENSIVE METABOLIC PANEL - Abnormal; Notable for the following:    Glucose, Bld 128 (*)    Albumin 3.2 (*)    Total Bilirubin 0.2 (*)    GFR calc non Af Amer 77 (*)    GFR calc Af Amer 89 (*)    All other components within normal limits  URINALYSIS, ROUTINE W REFLEX MICROSCOPIC - Abnormal; Notable for the following:    APPearance CLOUDY (*)    Hgb urine dipstick LARGE (*)    Leukocytes, UA SMALL (*)  All other components within normal limits  URINE MICROSCOPIC-ADD ON  POCT PREGNANCY, URINE   Results for orders placed during the hospital encounter of 05/28/13  CBC WITH DIFFERENTIAL      Result Value Range   WBC 10.6 (*) 4.0 - 10.5 K/uL   RBC 4.36  3.87 - 5.11 MIL/uL   Hemoglobin 12.6  12.0 - 15.0 g/dL   HCT 95.2  84.1 - 32.4 %   MCV 89.4  78.0 - 100.0 fL   MCH 28.9  26.0 - 34.0 pg   MCHC 32.3  30.0 - 36.0 g/dL   RDW 40.1  02.7 - 25.3 %   Platelets 190  150 - 400 K/uL   Neutrophils Relative % 51  43 - 77 %   Neutro Abs 5.4  1.7 - 7.7 K/uL   Lymphocytes Relative 40  12 - 46 %   Lymphs Abs 4.2 (*) 0.7 - 4.0 K/uL   Monocytes  Relative 7  3 - 12 %   Monocytes Absolute 0.8  0.1 - 1.0 K/uL   Eosinophils Relative 1  0 - 5 %   Eosinophils Absolute 0.1  0.0 - 0.7 K/uL   Basophils Relative 0  0 - 1 %   Basophils Absolute 0.0  0.0 - 0.1 K/uL  COMPREHENSIVE METABOLIC PANEL      Result Value Range   Sodium 140  135 - 145 mEq/L   Potassium 3.6  3.5 - 5.1 mEq/L   Chloride 106  96 - 112 mEq/L   CO2 24  19 - 32 mEq/L   Glucose, Bld 128 (*) 70 - 99 mg/dL   BUN 21  6 - 23 mg/dL   Creatinine, Ser 6.64  0.50 - 1.10 mg/dL   Calcium 8.9  8.4 - 40.3 mg/dL   Total Protein 6.8  6.0 - 8.3 g/dL   Albumin 3.2 (*) 3.5 - 5.2 g/dL   AST 19  0 - 37 U/L   ALT 24  0 - 35 U/L   Alkaline Phosphatase 105  39 - 117 U/L   Total Bilirubin 0.2 (*) 0.3 - 1.2 mg/dL   GFR calc non Af Amer 77 (*) >90 mL/min   GFR calc Af Amer 89 (*) >90 mL/min  URINALYSIS, ROUTINE W REFLEX MICROSCOPIC      Result Value Range   Color, Urine YELLOW  YELLOW   APPearance CLOUDY (*) CLEAR   Specific Gravity, Urine 1.024  1.005 - 1.030   pH 5.0  5.0 - 8.0   Glucose, UA NEGATIVE  NEGATIVE mg/dL   Hgb urine dipstick LARGE (*) NEGATIVE   Bilirubin Urine NEGATIVE  NEGATIVE   Ketones, ur NEGATIVE  NEGATIVE mg/dL   Protein, ur NEGATIVE  NEGATIVE mg/dL   Urobilinogen, UA 0.2  0.0 - 1.0 mg/dL   Nitrite NEGATIVE  NEGATIVE   Leukocytes, UA SMALL (*) NEGATIVE  URINE MICROSCOPIC-ADD ON      Result Value Range   Squamous Epithelial / LPF RARE  RARE   WBC, UA 3-6  <3 WBC/hpf   RBC / HPF 11-20  <3 RBC/hpf   Urine-Other MANY YEAST    POCT PREGNANCY, URINE      Result Value Range   Preg Test, Ur NEGATIVE  NEGATIVE    Ct Abdomen Pelvis Wo Contrast  05/28/2013   *RADIOLOGY REPORT*  Clinical Data: Right flank pain  CT ABDOMEN AND PELVIS WITHOUT CONTRAST  Technique:  Multidetector CT imaging of the abdomen and pelvis was performed following the standard  protocol without intravenous contrast.  Comparison: 10/17/2007  Findings: 4 mm right ureteral vesicle junction calculus is  associated with moderate right perinephric stranding and hydronephrosis.  Tiny calculus in the interpolar region of the right kidney on image 26.  Left renal calculus on image 28.  No left hydronephrosis.  Benign-appearing cysts in the lateral segment of the left lobe of the liver is larger.  Spleen, pancreas, adrenal glands, gallbladder are within normal limits.  Small hiatal hernia.  Normal appendix.  Sigmoid diverticulosis without acute diverticulitis.  Uterus and adnexa are within normal limits.  L4-5 degenerative disc disease with posterior disc bulge encroaches upon the lateral recesses.  L5 is sacralized.  No vertebral compression deformity.  Nonspecific sclerotic focus in L3.  IMPRESSION: 4 mm right ureteral vesicle junction calculus associated with right hydronephrosis and perinephric stranding.  Bilateral nephrolithiasis.   Original Report Authenticated By: Jolaine Click, M.D.   No diagnosis found. 1. Kidney stone, right  MDM  Patient with a history of kidney stones, all passed without intervention, with 4mm UVJ stone seen on CT today and symptoms that correlate. Her pain and nausea are controlled. Stable for discharge.   Arnoldo Hooker, PA-C 05/28/13 [phone]

## 2013-05-29 NOTE — ED Provider Notes (Signed)
Medical screening examination/treatment/procedure(s) were performed by non-physician practitioner and as supervising physician I was immediately available for consultation/collaboration.    Lulubelle Simcoe D Jaymz Traywick, MD 05/29/13 1740 

## 2013-05-30 ENCOUNTER — Emergency Department (HOSPITAL_COMMUNITY)
Admission: EM | Admit: 2013-05-30 | Discharge: 2013-05-30 | Disposition: A | Discharge status: Home / Self Care | Payer: Medicaid Other | Attending: Emergency Medicine | Admitting: Emergency Medicine

## 2013-05-30 ENCOUNTER — Encounter (HOSPITAL_COMMUNITY): Payer: Self-pay | Admitting: Nurse Practitioner

## 2013-05-30 ENCOUNTER — Ambulatory Visit: Payer: No Typology Code available for payment source | Attending: Family Medicine | Admitting: Internal Medicine

## 2013-05-30 VITALS — BP 133/92 | HR 87 | Temp 98.2°F | Resp 18 | Ht <= 58 in | Wt 175.8 lb

## 2013-05-30 DIAGNOSIS — Z3202 Encounter for pregnancy test, result negative: Secondary | ICD-10-CM | POA: Insufficient documentation

## 2013-05-30 DIAGNOSIS — Z87442 Personal history of urinary calculi: Secondary | ICD-10-CM | POA: Insufficient documentation

## 2013-05-30 DIAGNOSIS — N200 Calculus of kidney: Secondary | ICD-10-CM

## 2013-05-30 DIAGNOSIS — N201 Calculus of ureter: Secondary | ICD-10-CM | POA: Insufficient documentation

## 2013-05-30 DIAGNOSIS — R112 Nausea with vomiting, unspecified: Secondary | ICD-10-CM

## 2013-05-30 DIAGNOSIS — N1330 Unspecified hydronephrosis: Secondary | ICD-10-CM

## 2013-05-30 DIAGNOSIS — R1110 Vomiting, unspecified: Secondary | ICD-10-CM | POA: Insufficient documentation

## 2013-05-30 LAB — COMPREHENSIVE METABOLIC PANEL
ALT: 24 U/L (ref 0–35)
AST: 23 U/L (ref 0–37)
Albumin: 3.2 g/dL — ABNORMAL LOW (ref 3.5–5.2)
Calcium: 9.3 mg/dL (ref 8.4–10.5)
Chloride: 101 mEq/L (ref 96–112)
Creatinine, Ser: 0.82 mg/dL (ref 0.50–1.10)
Sodium: 138 mEq/L (ref 135–145)

## 2013-05-30 LAB — URINALYSIS, ROUTINE W REFLEX MICROSCOPIC
Glucose, UA: NEGATIVE mg/dL
Specific Gravity, Urine: 1.011 (ref 1.005–1.030)

## 2013-05-30 LAB — CBC WITH DIFFERENTIAL/PLATELET
Basophils Absolute: 0 10*3/uL (ref 0.0–0.1)
Basophils Relative: 0 % (ref 0–1)
Eosinophils Relative: 1 % (ref 0–5)
HCT: 40.8 % (ref 36.0–46.0)
MCHC: 32.8 g/dL (ref 30.0–36.0)
Monocytes Absolute: 0.7 10*3/uL (ref 0.1–1.0)
Neutro Abs: 7.2 10*3/uL (ref 1.7–7.7)
Platelets: 205 10*3/uL (ref 150–400)
RDW: 14 % (ref 11.5–15.5)
WBC: 10.2 10*3/uL (ref 4.0–10.5)

## 2013-05-30 LAB — URINE MICROSCOPIC-ADD ON

## 2013-05-30 LAB — POCT PREGNANCY, URINE: Preg Test, Ur: NEGATIVE

## 2013-05-30 MED ORDER — PERCOCET 5-325 MG PO TABS: 1.0000 | ORAL_TABLET | Freq: Four times a day (QID) | ORAL | Status: DC | PRN

## 2013-05-30 MED ORDER — ONDANSETRON HCL 4 MG/2ML IJ SOLN
4.0000 mg | Freq: Once | INTRAMUSCULAR | Status: AC
  Administered 2013-05-30: 4 mg via INTRAVENOUS
  Filled 2013-05-30: qty 2

## 2013-05-30 MED ORDER — MORPHINE SULFATE 4 MG/ML IJ SOLN
4.0000 mg | Freq: Once | INTRAMUSCULAR | Status: AC
  Administered 2013-05-30: 4 mg via INTRAVENOUS
  Filled 2013-05-30: qty 1

## 2013-05-30 MED ORDER — TAMSULOSIN HCL 0.4 MG PO CAPS: 0.4000 mg | ORAL_CAPSULE | Freq: Every day | ORAL | Status: DC

## 2013-05-30 MED ORDER — KETOROLAC TROMETHAMINE 30 MG/ML IJ SOLN
30.0000 mg | Freq: Once | INTRAMUSCULAR | Status: AC
  Administered 2013-05-30: 30 mg via INTRAVENOUS
  Filled 2013-05-30: qty 1

## 2013-05-30 MED ORDER — IBUPROFEN 600 MG PO TABS: 600.0000 mg | ORAL_TABLET | Freq: Four times a day (QID) | ORAL | Status: DC | PRN

## 2013-05-30 NOTE — ED Notes (Signed)
Per pt: dx with kidney stone on 05/28/13.  Pt states that the pain lessened and now it is hurting worse on the right flank and mid lower abdomen.

## 2013-05-30 NOTE — Progress Notes (Signed)
   CARE MANAGEMENT ED NOTE 05/30/2013  Patient:  Catherine Patrick, Catherine Patrick   Account Number:  [id number]  Date Initiated:  05/30/2013  Documentation initiated by:  Radford Pax  Subjective/Objective Assessment:   Patient presents to ED with right flank pain and mid lower back pain     Subjective/Objective Assessment Detail:     Action/Plan:   Action/Plan Detail:   Anticipated DC Date:       Status Recommendation to Physician:   Result of Recommendation:    Other ED Services  Consult Working Plan    DC Planning Services  Other  PCP issues    Choice offered to / List presented to:            Status of service:  Completed, signed off  ED Comments:   ED Comments Detail:  Patient states her pcp is the [facility].  She does not see any particular doctor there.

## 2013-05-30 NOTE — ED Provider Notes (Signed)
Medical screening examination/treatment/procedure(s) were performed by non-physician practitioner and as supervising physician I was immediately available for consultation/collaboration.  Pain controlled in Er   Lyanne Co, MD 05/30/13 [phone]

## 2013-05-30 NOTE — Progress Notes (Signed)
Patient ID: Catherine Patrick, female   DOB: October 04, 1966, 47 y.o.   MRN: [id number]   CC:  HPI: Patient is a 47 year old female with past medical history of nephrolithiasis who was seen in the emergency department on 05/28/2013 with abdominal pain, nausea and vomiting. A CT scan of the abdomen and pelvis were done which showed a right 4 mm ureteral stone associated with right hydronephrosis and perinephric stranding. There was bilateral nephrolithiasis. She has not required intervention in the past. Her creatinine was normal at 0.87 during that visit. The patient tells me that she is having ongoing right flank pain and suprapubic pain. No frank fevers. No blood in the urine. She has strained her urine and has not passed any obvious stones although there was some white material in the filter that was not hard. Of concern, she has had recurrent nausea and vomiting and is mildly toxic appearing.  No Known Allergies Past Medical History  Diagnosis Date  . Kidney stones    Current Outpatient Prescriptions on File Prior to Visit  Medication Sig Dispense Refill  . acetaminophen (TYLENOL) 325 MG tablet Take 650 mg by mouth every 6 (six) hours as needed for pain.      Marland Kitchen aspirin 325 MG tablet Take 325 mg by mouth every 4 (four) hours as needed for pain (pain).      . ondansetron (ZOFRAN) 4 MG tablet Take 1 tablet (4 mg total) by mouth every 6 (six) hours.  12 tablet  0  . oxyCODONE-acetaminophen (PERCOCET/ROXICET) 5-325 MG per tablet Take 1-2 tablets by mouth every 4 (four) hours as needed for pain.  20 tablet  0   No current facility-administered medications on file prior to visit.   History reviewed. No pertinent family history. History   Social History  . Marital Status: Married    Spouse Name: N/A    Number of Children: N/A  . Years of Education: N/A   Occupational History  . Not on file.   Social History Main Topics  . Smoking status: Never Smoker   . Smokeless tobacco: Never Used  . Alcohol  Use: No  . Drug Use: No  . Sexually Active: Yes    Birth Control/ Protection: None   Other Topics Concern  . Not on file   Social History Narrative  . No narrative on file    Review of Systems: Constitutional: No fever or chills;  Appetite diminished; No weight loss.  HEENT: No blurry vision or diplopia, no pharyngitis or dysphagia CV: No chest pain or arrhythmia.  Resp: No SOB, no cough. GI: + N/V, no diarrhea, no melena or hematochezia.  GU: No dysuria or hematuria.  MSK: no myalgias/arthralgias.  Neuro:  No headache or focal neurological deficits.  Psych: No depression or anxiety.  Endo: No thyroid disease or DM.  Skin: No rashes or lesions.  Heme: No anemia or blood dyscrasia   Objective:   Filed Vitals:   05/30/13 1546  BP: 133/92  Pulse: 87  Temp: 98.2 F (36.8 C)  Resp: 18    Physical Exam  Constitutional: Appears well-developed and well-nourished. No distress.  HENT: Normocephalic. External right and left ear normal. Oropharynx is clear and moist.  Eyes: Conjunctivae and EOM are normal. PERRLA, no scleral icterus.  Neck: Normal ROM. Neck supple. No JVD. No tracheal deviation. No thyromegaly.  CVS: RRR, S1/S2 +, no murmurs, no gallops, no carotid bruit.  Pulmonary: Effort and breath sounds normal, no stridor, rhonchi, wheezes, rales.  Abdominal: Soft.  BS +,  no distension .  Right CVA tenderness and suprapubic tenderness.  Musculoskeletal: Normal range of motion. No edema and no tenderness.  Skin: Skin is warm and dry. No rash noted. Not diaphoretic. No erythema. No pallor.  Psychiatric: Normal mood and affect. Behavior, judgment, thought content normal.   Lab Results  Component Value Date   WBC 10.6* 05/28/2013   HGB 12.6 05/28/2013   HCT 39.0 05/28/2013   MCV 89.4 05/28/2013   PLT 190 05/28/2013   Lab Results  Component Value Date   CREATININE 0.87 05/28/2013   BUN 21 05/28/2013   NA 140 05/28/2013   K 3.6 05/28/2013   CL 106 05/28/2013   CO2 24 05/28/2013     Lab Results  Component Value Date   HGBA1C 5.8* 01/21/2013   Lipid Panel  No results found for this basename: chol, trig, hdl, cholhdl, vldl, ldlcalc       Assessment and plan:  1. Right hydronephrosis/nephrolithiasis with nausea and vomiting: Patient clearly needs to be reevaluated for the development of acute renal failure and ongoing hydronephrosis. Suspect she will need hospitalization for IV fluids, pain and nausea control. Would recommend a urological consultation if workup reveals worsening kidney function or ongoing obstructive ureteral stone. She'll be sent to the emergency department for further evaluation.   Signed:  Dr. Trula Ore Rama 05/30/2013 3:54 PM

## 2013-05-30 NOTE — ED Notes (Addendum)
Pt cleared to drink water at this time by Ebbie Ridge PA. Water given to patient.

## 2013-05-30 NOTE — Progress Notes (Signed)
PT TRANSFERRED TO [facility] ED FOR FURTHER WORKUP IN STABLE CONDITION VIA SHUTTLE

## 2013-05-30 NOTE — Progress Notes (Signed)
PT HERE FOR ROUTINE VISIT FOLLOWING [facility]ID[facility] DIAG MAY [phone]. CONTINUES TO C/O BILAT LOWER ABD PAIN WITH X 2 EPISODES OF VOMITING. LMP 04/22/13.C/O SHARP INTERMIT STABBING PAIN UNCONTROLLED WITH PAIN MEDS.

## 2013-05-30 NOTE — ED Provider Notes (Signed)
History    CSN: [id number] Arrival date & time 05/30/13  1642  First MD Initiated Contact with Patient 05/30/13 1704     Chief Complaint  Patient presents with  . Flank Pain    Pt seen in [facility] ED on 05/28/13 for kidney stone  . Abdominal Pain    Mid Lower   (Consider location/radiation/quality/duration/timing/severity/associated sxs/prior Treatment) HPI Patient presents emergency department with right flank pain.  The patient was seen here 2 days, ago for ureteral stone.  Patient, states she did not follow up urology .  Patient denies chest pain, shortness of breath, fever, back pain, blurred vision, weakness, numbness, dizziness, rash or syncope.  Patient, states, that her pain medicines at home were helping with her pain.  Patient, states, that nothing seems to make her condition worse.  Patient is advised, that she'll need followup at the [facility] by the urology office.  Patient vomited once earlier this morning. Past Medical History  Diagnosis Date  . Kidney stones    Past Surgical History  Procedure Laterality Date  . Hernia repair    . Cesarean section     History reviewed. No pertinent family history. History  Substance Use Topics  . Smoking status: Never Smoker   . Smokeless tobacco: Never Used  . Alcohol Use: No   OB History   Grav Para Term Preterm Abortions TAB SAB Ect Mult Living            3     Review of Systems All other systems negative except as documented in the HPI. All pertinent positives and negatives as reviewed in the HPI. Allergies  Review of patient's allergies indicates no known allergies.  Home Medications   Current Outpatient Rx  Name  Route  Sig  Dispense  Refill  . acetaminophen (TYLENOL) 325 MG tablet   Oral   Take 650 mg by mouth every 6 (six) hours as needed for pain.         Marland Kitchen aspirin 325 MG tablet   Oral   Take 325 mg by mouth every 4 (four) hours as needed for pain (pain).         . ondansetron (ZOFRAN) 4 MG  tablet   Oral   Take 1 tablet (4 mg total) by mouth every 6 (six) hours.   12 tablet   0   . oxyCODONE-acetaminophen (PERCOCET/ROXICET) 5-325 MG per tablet   Oral   Take 1-2 tablets by mouth every 4 (four) hours as needed for pain.   20 tablet   0    BP 127/77  Pulse 84  Temp(Src) 98.6 F (37 C) (Oral)  Resp 16  SpO2 97%  LMP 04/22/2013 Physical Exam  Nursing note and vitals reviewed. Constitutional: She is oriented to person, place, and time. She appears well-developed and well-nourished.  HENT:  Head: Normocephalic and atraumatic.  Mouth/Throat: Oropharynx is clear and moist.  Neck: Normal range of motion. Neck supple.  Cardiovascular: Normal rate, regular rhythm and normal heart sounds.  Exam reveals no gallop and no friction rub.   No murmur heard. Pulmonary/Chest: Effort normal and breath sounds normal. No respiratory distress.  Abdominal: Soft. Bowel sounds are normal. She exhibits no distension. There is no tenderness.  Neurological: She is alert and oriented to person, place, and time.  Skin: Skin is warm and dry.    ED Course  Procedures (including critical care time) Labs Reviewed  COMPREHENSIVE METABOLIC PANEL - Abnormal; Notable for the following:  Albumin 3.2 (*)    GFR calc non Af Amer 83 (*)    All other components within normal limits  URINALYSIS, ROUTINE W REFLEX MICROSCOPIC - Abnormal; Notable for the following:    Hgb urine dipstick MODERATE (*)    Leukocytes, UA SMALL (*)    All other components within normal limits  CBC WITH DIFFERENTIAL  URINE MICROSCOPIC-ADD ON  POCT PREGNANCY, URINE   Patient is referred back to, urology.  She's not had any vomiting here in the emergency department and her pain is under control.  Patient is advised return here as needed.  Patient is advised to increase her fluid intake, as well  MDM  MDM Reviewed: vitals and nursing note Reviewed previous: labs and CT scan Interpretation: labs      Carlyle Dolly, PA-C 05/30/13 2114

## 2013-05-31 ENCOUNTER — Encounter: Payer: Self-pay | Admitting: Internal Medicine

## 2013-05-31 DIAGNOSIS — N200 Calculus of kidney: Secondary | ICD-10-CM

## 2013-05-31 NOTE — Progress Notes (Unsigned)
Patient ID: Catherine Patrick, female   DOB: 11-19-1966, 47 y.o.   MRN: [id number]  Patient was seen in emergency department 05/30/2013 after being evaluated here for right hydronephrosis/nephrolithiasis. Was felt that she needed urgent labs that could not wait, which was why she was sent to the emergency department. She was put on Flomax, ibuprofen and Percocet and referred back to this clinic for urology referral. Referral made.   Catherine Patrick 05/31/2013 5:36 PM

## 2013-06-06 ENCOUNTER — Telehealth: Payer: Self-pay | Admitting: Family Medicine

## 2013-06-06 NOTE — Telephone Encounter (Signed)
Pt says her and her husband have not been called for their referral dates to urologist.  Please f/u.

## 2013-06-11 NOTE — Telephone Encounter (Signed)
I spoke to Ms Linch She is aware that I'm waiting for the Dr to check her husband's labs and to order the ct

## 2013-07-04 ENCOUNTER — Ambulatory Visit: Payer: Self-pay | Attending: Family Medicine

## 2013-07-04 ENCOUNTER — Telehealth: Payer: Self-pay | Admitting: Family Medicine

## 2013-07-04 NOTE — Telephone Encounter (Signed)
This patient is waiting for a urology appointment. Turns out pt now has Medicaid and the effective dates include February 2014.  Pt was not aware and had been using orange card.     Thanks

## 2013-07-08 NOTE — Telephone Encounter (Signed)
I sent the referral to [facility]  Waiting for an appointment

## 2013-08-23 ENCOUNTER — Ambulatory Visit: Payer: Medicaid Other | Admitting: Internal Medicine

## 2013-10-10 ENCOUNTER — Other Ambulatory Visit: Payer: Self-pay

## 2013-11-17 ENCOUNTER — Emergency Department (HOSPITAL_COMMUNITY)
Admission: EM | Admit: 2013-11-17 | Discharge: 2013-11-17 | Disposition: A | Discharge status: Home / Self Care | Payer: Medicaid Other | Attending: Emergency Medicine | Admitting: Emergency Medicine

## 2013-11-17 ENCOUNTER — Encounter (HOSPITAL_COMMUNITY): Payer: Self-pay | Admitting: Emergency Medicine

## 2013-11-17 ENCOUNTER — Emergency Department (HOSPITAL_COMMUNITY): Payer: Medicaid Other

## 2013-11-17 DIAGNOSIS — M25579 Pain in unspecified ankle and joints of unspecified foot: Secondary | ICD-10-CM | POA: Insufficient documentation

## 2013-11-17 DIAGNOSIS — M79672 Pain in left foot: Secondary | ICD-10-CM

## 2013-11-17 DIAGNOSIS — Z87442 Personal history of urinary calculi: Secondary | ICD-10-CM | POA: Insufficient documentation

## 2013-11-17 MED ORDER — NAPROXEN 375 MG PO TABS: 375.0000 mg | ORAL_TABLET | Freq: Two times a day (BID) | ORAL | Status: DC | PRN

## 2013-11-17 MED ORDER — OXYCODONE-ACETAMINOPHEN 5-325 MG PO TABS
1.0000 | ORAL_TABLET | Freq: Once | ORAL | Status: AC
  Administered 2013-11-17: 1 via ORAL
  Filled 2013-11-17: qty 1

## 2013-11-17 MED ORDER — IBUPROFEN 200 MG PO TABS
600.0000 mg | ORAL_TABLET | Freq: Once | ORAL | Status: DC
  Filled 2013-11-17: qty 3

## 2013-11-17 NOTE — ED Provider Notes (Signed)
CSN: [id number]     Arrival date & time 11/17/13  4098 History   First MD Initiated Contact with Patient 11/17/13 0751     Chief Complaint  Patient presents with  . Foot Pain   (Consider location/radiation/quality/duration/timing/severity/associated sxs/prior Treatment) HPI  47 year old female with left heel pain. Gradual onset a few weeks ago and progressively worsening. Pain started shortly after starting a new job. 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 She has pain in her left heel. Minimal to no pain while at rest but has significant pain when she bears weight and ambulates. It improves slightly after she's been on her feet for a couple minutes but she still has some fairly significant pain. No rash. No numbness or tingling. No swelling. No focal trauma that she is aware of. No history similar pain prior to the a month ago. Denies any significant pain anywhere else. She's tried taking Tylenol with minimal relief.  Past Medical History  Diagnosis Date  . Kidney stones    Past Surgical History  Procedure Laterality Date  . Hernia repair    . Cesarean section     No family history on file. History  Substance Use Topics  . Smoking status: Never Smoker   . Smokeless tobacco: Never Used  . Alcohol Use: No   OB History   Grav Para Term Preterm Abortions TAB SAB Ect Mult Living            3     Review of Systems  All systems reviewed and negative, other than as noted in HPI.   Allergies  Review of patient's allergies indicates no known allergies.  Home Medications   Current Outpatient Rx  Name  Route  Sig  Dispense  Refill  . ibuprofen (ADVIL,MOTRIN) 200 MG tablet   Oral   Take 600 mg by mouth every 6 (six) hours as needed for headache.         . naproxen (NAPROSYN) 375 MG tablet   Oral   Take 1 tablet (375 mg total) by mouth 2 (two) times daily as needed.   60 tablet   0    BP 149/97  Pulse 74  Temp(Src) 98.3 F (36.8 C)  (Oral)  Resp 18  SpO2 98%  LMP 09/22/2013 Physical Exam  Nursing note and vitals reviewed. Constitutional: She appears well-developed and well-nourished. No distress.  HENT:  Head: Normocephalic and atraumatic.  Eyes: Conjunctivae are normal. Right eye exhibits no discharge. Left eye exhibits no discharge.  Neck: Neck supple.  Cardiovascular: Normal rate, regular rhythm and normal heart sounds.  Exam reveals no gallop and no friction rub.   No murmur heard. Pulmonary/Chest: Effort normal and breath sounds normal. No respiratory distress.  Abdominal: Soft. She exhibits no distension. There is no tenderness.  Musculoskeletal: She exhibits no edema and no tenderness.  Left foot and leg grossly normal in appearance and symmetric as compared to the right. There is no edema. No rash or other concerning skin changes. Foot is warm to the touch. Sensation is intact to light touch. Brisk cap refill in toes. Palpable DP and PT pulses. Point tenderness along the plantar aspect of the proximal/medial calcaneus. No significant tenderness over the plantar fascia.  Neurological: She is alert.  Skin: Skin is warm and dry.  Psychiatric: She has a normal mood and affect. Her behavior is normal. Thought content normal.    ED Course  Procedures (including critical care time) Labs Review  Labs Reviewed - No data to display Imaging Review Dg Foot Complete Left  11/17/2013   CLINICAL DATA:  Medial calcaneus pain  EXAM: LEFT FOOT - COMPLETE 3+ VIEW  COMPARISON:  05/17/2012  FINDINGS: There is no evidence of fracture or dislocation. There is no evidence of arthropathy or other focal bone abnormality. Soft tissues are unremarkable.  IMPRESSION: No acute osseous finding.   Electronically Signed   By: Ruel Favors M.D.   On: 11/17/2013 09:09    EKG Interpretation   None       MDM   1. Heel pain, left    47yF with L heel pain. 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 Plan PRN NSAIDs. Discussed stretching exercises, possibly changing foot wear, weight loss, cushioned pad at work, etc. Doubt vascular etiology. Not consistent with DVT, claudication/occlusion. Doubt reactive arthropathy as heel isolated to calcaneous. Doubt infectious.     Raeford Razor, MD 11/17/13 [id number]

## 2013-11-17 NOTE — ED Notes (Signed)
Pt c/o left foot pain for a month. Pt states that it hurts worse when she tries to walk on it after sitting or lying down for a while.

## 2013-12-04 ENCOUNTER — Encounter: Payer: Self-pay | Admitting: Internal Medicine

## 2013-12-04 ENCOUNTER — Ambulatory Visit: Payer: Medicaid Other | Attending: Internal Medicine | Admitting: Internal Medicine

## 2013-12-04 VITALS — BP 139/93 | HR 71 | Temp 98.6°F | Resp 16 | Ht <= 58 in | Wt 165.0 lb

## 2013-12-04 DIAGNOSIS — M722 Plantar fascial fibromatosis: Secondary | ICD-10-CM | POA: Insufficient documentation

## 2013-12-04 MED ORDER — PREDNISONE 20 MG PO TABS: 20.0000 mg | ORAL_TABLET | Freq: Every day | ORAL | Status: AC

## 2013-12-04 MED ORDER — TRAMADOL HCL 50 MG PO TABS: 50.0000 mg | ORAL_TABLET | Freq: Three times a day (TID) | ORAL | Status: DC | PRN

## 2013-12-04 NOTE — Progress Notes (Signed)
Pt is here as a HFU For 2 months pt has been having stabbing pain in her left leg.

## 2013-12-04 NOTE — Progress Notes (Signed)
Patient ID: Catherine Patrick, female   DOB: 10-23-1966, 47 y.o.   MRN: [id number]   CC:  HPI:  47 year old female with left heel pain. She presented to the ER on 12/14 and was prescribed Naprosyn. The patient states that the Naprosyn has given her some pain relief, but she still hurts. Pain is worse in the morning when she wakes up she points towards her heel. No history of trauma. She denies any history of claudication No history of arthritis  No Known Allergies Past Medical History  Diagnosis Date  . Kidney stones    Current Outpatient Prescriptions on File Prior to Visit  Medication Sig Dispense Refill  . ibuprofen (ADVIL,MOTRIN) 200 MG tablet Take 600 mg by mouth every 6 (six) hours as needed for headache.      . naproxen (NAPROSYN) 375 MG tablet Take 1 tablet (375 mg total) by mouth 2 (two) times daily as needed.  60 tablet  0   No current facility-administered medications on file prior to visit.   History reviewed. No pertinent family history. History   Social History  . Marital Status: Married    Spouse Name: N/A    Number of Children: N/A  . Years of Education: N/A   Occupational History  . Not on file.   Social History Main Topics  . Smoking status: Never Smoker   . Smokeless tobacco: Never Used  . Alcohol Use: No  . Drug Use: No  . Sexual Activity: Yes    Birth Control/ Protection: None   Other Topics Concern  . Not on file   Social History Narrative  . No narrative on file    Review of Systems  Constitutional: Negative for fever, chills, diaphoresis, activity change, appetite change and fatigue.  HENT: Negative for ear pain, nosebleeds, congestion, facial swelling, rhinorrhea, neck pain, neck stiffness and ear discharge.   Eyes: Negative for pain, discharge, redness, itching and visual disturbance.  Respiratory: Negative for cough, choking, chest tightness, shortness of breath, wheezing and stridor.   Cardiovascular: Negative for chest pain, palpitations and  leg swelling.  Gastrointestinal: Negative for abdominal distention.  Genitourinary: Negative for dysuria, urgency, frequency, hematuria, flank pain, decreased urine volume, difficulty urinating and dyspareunia.  Musculoskeletal: Negative for back pain, joint swelling, heel pain left foot, arthralgias and gait problem.  Neurological: Negative for dizziness, tremors, seizures, syncope, facial asymmetry, speech difficulty, weakness, light-headedness, numbness and headaches.  Hematological: Negative for adenopathy. Does not bruise/bleed easily.  Psychiatric/Behavioral: Negative for hallucinations, behavioral problems, confusion, dysphoric mood, decreased concentration and agitation.    Objective:   Filed Vitals:   12/04/13 1626  BP: 139/93  Pulse: 71  Temp: 98.6 F (37 C)  Resp: 16    Physical Exam  Constitutional: Appears well-developed and well-nourished. No distress.  HENT: Normocephalic. External right and left ear normal. Oropharynx is clear and moist.  Eyes: Conjunctivae and EOM are normal. PERRLA, no scleral icterus.  Neck: Normal ROM. Neck supple. No JVD. No tracheal deviation. No thyromegaly.  CVS: RRR, S1/S2 +, no murmurs, no gallops, no carotid bruit.  Pulmonary: Effort and breath sounds normal, no stridor, rhonchi, wheezes, rales.  Abdominal: Soft. BS +,  no distension, tenderness, rebound or guarding.  Musculoskeletal: Normal range of motion. No edema and no tenderness.  Lymphadenopathy: No lymphadenopathy noted, cervical, inguinal. Neuro: Alert. Normal reflexes, muscle tone coordination. No cranial nerve deficit. Skin: Skin is warm and dry. No rash noted. Not diaphoretic. No erythema. No pallor.  Psychiatric: Normal mood and affect.  Behavior, judgment, thought content normal.   Lab Results  Component Value Date   WBC 10.2 05/30/2013   HGB 13.4 05/30/2013   HCT 40.8 05/30/2013   MCV 89.5 05/30/2013   PLT 205 05/30/2013   Lab Results  Component Value Date   CREATININE  0.82 05/30/2013   BUN 11 05/30/2013   NA 138 05/30/2013   K 4.3 05/30/2013   CL 101 05/30/2013   CO2 28 05/30/2013    Lab Results  Component Value Date   HGBA1C 5.8* 01/21/2013   Lipid Panel  No results found for this basename: chol, trig, hdl, cholhdl, vldl, ldlcalc       Assessment and plan:   Patient Active Problem List   Diagnosis Date Noted  . Hydronephrosis, right 05/30/2013  . Nephrolithiasis 05/30/2013  . Nausea and vomiting 05/30/2013     plantar fasciitis left foot Ambulatory referral to physical therapy Referral to podiatry Continue Naprosyn Prednisone for 5 days Tramadol for pain Recommended to obtain bilateral arch support from regular pharmacy   Followup when necessary   The patient was given clear instructions to go to ER or return to medical center if symptoms don't improve, worsen or new problems develop. The patient verbalized understanding. The patient was told to call to get any lab results if not heard anything in the next week.

## 2013-12-18 ENCOUNTER — Ambulatory Visit (INDEPENDENT_AMBULATORY_CARE_PROVIDER_SITE_OTHER): Payer: Medicaid Other | Admitting: Podiatry

## 2013-12-18 ENCOUNTER — Encounter: Payer: Self-pay | Admitting: Podiatry

## 2013-12-18 VITALS — BP 155/100 | HR 71 | Ht <= 58 in | Wt 160.0 lb

## 2013-12-18 DIAGNOSIS — M216X1 Other acquired deformities of right foot: Secondary | ICD-10-CM | POA: Insufficient documentation

## 2013-12-18 DIAGNOSIS — M21969 Unspecified acquired deformity of unspecified lower leg: Secondary | ICD-10-CM | POA: Insufficient documentation

## 2013-12-18 DIAGNOSIS — M722 Plantar fascial fibromatosis: Secondary | ICD-10-CM | POA: Insufficient documentation

## 2013-12-18 DIAGNOSIS — M216X9 Other acquired deformities of unspecified foot: Secondary | ICD-10-CM

## 2013-12-18 DIAGNOSIS — M216X2 Other acquired deformities of left foot: Secondary | ICD-10-CM

## 2013-12-18 NOTE — Progress Notes (Signed)
Patient points plantar aspect of the left heel being hurting since November 2014. It hurts the most after been sitting for a while and get up, in the morning, after taking break, and at the end of the day.   Objective: Pedal pulses palpable. No edema noted. Feet are cold along the digits bilateral. Hammer toe 5th bilateral. Flat foot with forefoot varus.  Pain at plantar aspect left heel. Orthopedic: Elevated first ray with excess rear foot pronation bilateral. Radiographic examination shoe increased lateral deviation of the Calcaneo-cuboid angle, elevated first ray bilateral.   Assessment: Plantar fasciitis left.  Pes planus with elevated first ray bilateral.  Forefoot varus bilateral.  Plan:  Reviewed clinical findings and available options, NSAIA, injection, Orthotics, proper shoe gear, change in activity, including surgical options. Today left heel was injected with 4mg  Dexamethasone and 8 mg Triamcinolone mixed with 1ml of 1% Xylocaine plain and 1ml of 0.5% Marcaine plain.  Patient will return to prepare for Orthotics.

## 2013-12-18 NOTE — Patient Instructions (Signed)
Seen for left heel pain. Injection given. Return in 2 weeks.

## 2014-01-01 ENCOUNTER — Ambulatory Visit (INDEPENDENT_AMBULATORY_CARE_PROVIDER_SITE_OTHER): Payer: Medicaid Other | Admitting: Podiatry

## 2014-01-01 ENCOUNTER — Ambulatory Visit: Payer: Medicaid Other | Admitting: Podiatry

## 2014-01-01 ENCOUNTER — Encounter: Payer: Self-pay | Admitting: Podiatry

## 2014-01-01 VITALS — BP 144/105 | HR 73 | Ht <= 58 in | Wt 159.0 lb

## 2014-01-01 DIAGNOSIS — M216X9 Other acquired deformities of unspecified foot: Secondary | ICD-10-CM

## 2014-01-01 DIAGNOSIS — M21969 Unspecified acquired deformity of unspecified lower leg: Secondary | ICD-10-CM

## 2014-01-01 DIAGNOSIS — M79609 Pain in unspecified limb: Secondary | ICD-10-CM

## 2014-01-01 DIAGNOSIS — M722 Plantar fascial fibromatosis: Secondary | ICD-10-CM

## 2014-01-01 NOTE — Progress Notes (Signed)
Injection helped. Left heel pain is getting better. Wearing orthotics that she got from [employer] [facility].  She is starting to have right knee pain.  Will continue to stay in god shoes with her orthotics. May take NSAIA as needed. She has some at home. Return if pain returns.

## 2014-01-01 NOTE — Patient Instructions (Signed)
Improved heel pain with injection. Continue to wear well supported shoes with orthotics. Return as needed.

## 2014-01-31 ENCOUNTER — Encounter: Payer: Self-pay | Admitting: Internal Medicine

## 2014-01-31 ENCOUNTER — Ambulatory Visit: Payer: Medicaid Other | Attending: Internal Medicine | Admitting: Internal Medicine

## 2014-01-31 VITALS — BP 130/89 | HR 78 | Temp 98.0°F | Resp 17 | Wt 162.6 lb

## 2014-01-31 DIAGNOSIS — Z09 Encounter for follow-up examination after completed treatment for conditions other than malignant neoplasm: Secondary | ICD-10-CM | POA: Insufficient documentation

## 2014-01-31 DIAGNOSIS — M25569 Pain in unspecified knee: Secondary | ICD-10-CM | POA: Insufficient documentation

## 2014-01-31 DIAGNOSIS — M25561 Pain in right knee: Secondary | ICD-10-CM

## 2014-01-31 MED ORDER — NAPROXEN 375 MG PO TABS: 375.0000 mg | ORAL_TABLET | Freq: Two times a day (BID) | ORAL | Status: DC | PRN

## 2014-01-31 NOTE — Progress Notes (Signed)
Patient here for right knee pain past month Denies any injury

## 2014-01-31 NOTE — Progress Notes (Signed)
MRN: [id number] Name: Catherine Patrick  Sex: female Age: 48 y.o. DOB: 04/06/1966  Allergies: Review of patient's allergies indicates no known allergies.  Chief Complaint  Patient presents with  . Follow-up    HPI: Patient is 48 y.o. female who comes today reported to have right knee pain on and off for the last one month denies any fall or trauma, as per patient the pain is more worse going upstairs, she takes naproxen which helps her with the symptoms denies any fever chills numbness weakness.   Past Medical History  Diagnosis Date  . Kidney stones     Past Surgical History  Procedure Laterality Date  . Hernia repair    . Cesarean section        Medication List       This list is accurate as of: 01/31/14  5:16 PM.  Always use your most recent med list.               ibuprofen 200 MG tablet  Commonly known as:  ADVIL,MOTRIN  Take 600 mg by mouth every 6 (six) hours as needed for headache.     naproxen 375 MG tablet  Commonly known as:  NAPROSYN  Take 1 tablet (375 mg total) by mouth 2 (two) times daily as needed.     traMADol 50 MG tablet  Commonly known as:  ULTRAM  Take 1 tablet (50 mg total) by mouth every 8 (eight) hours as needed.        Meds ordered this encounter  Medications  . naproxen (NAPROSYN) 375 MG tablet    Sig: Take 1 tablet (375 mg total) by mouth 2 (two) times daily as needed.    Dispense:  60 tablet    Refill:  1     There is no immunization history on file for this patient.  History reviewed. No pertinent family history.  History  Substance Use Topics  . Smoking status: Never Smoker   . Smokeless tobacco: Never Used  . Alcohol Use: No    Review of Systems   As noted in HPI  Filed Vitals:   01/31/14 1649  BP: 130/89  Pulse: 78  Temp: 98 F (36.7 C)  Resp: 17    Physical Exam  Physical Exam  Cardiovascular: Normal rate and regular rhythm.   Pulmonary/Chest: Breath sounds normal. No respiratory distress. She has  no wheezes. She has no rales.  Musculoskeletal:  Right knee crepitation+ minimal tenderness no swelling    CBC    Component Value Date/Time   WBC 10.2 05/30/2013 1749   RBC 4.56 05/30/2013 1749   HGB 13.4 05/30/2013 1749   HCT 40.8 05/30/2013 1749   PLT 205 05/30/2013 1749   MCV 89.5 05/30/2013 1749   LYMPHSABS 2.2 05/30/2013 1749   MONOABS 0.7 05/30/2013 1749   EOSABS 0.1 05/30/2013 1749   BASOSABS 0.0 05/30/2013 1749    CMP     Component Value Date/Time   NA 138 05/30/2013 1749   K 4.3 05/30/2013 1749   CL 101 05/30/2013 1749   CO2 28 05/30/2013 1749   GLUCOSE 84 05/30/2013 1749   BUN 11 05/30/2013 1749   CREATININE 0.82 05/30/2013 1749   CALCIUM 9.3 05/30/2013 1749   PROT 7.3 05/30/2013 1749   ALBUMIN 3.2* 05/30/2013 1749   AST 23 05/30/2013 1749   ALT 24 05/30/2013 1749   ALKPHOS 107 05/30/2013 1749   BILITOT 0.5 05/30/2013 1749   GFRNONAA 83* 05/30/2013 1749   GFRAA >  90 05/30/2013 1749    No results found for this basename: chol, tri, ldl    No components found with this basename: hga1c    Lab Results  Component Value Date/Time   AST 23 05/30/2013  5:49 PM    Assessment and Plan  Right knee pain - most likely osteoarthritis Plan: DG Knee Complete 4 Views Right, naproxen (NAPROSYN) 375 MG tablet   Return in about 3 months (around 04/30/2014), or if symptoms worsen or fail to improve.  Doris Cheadle, MD

## 2014-03-12 ENCOUNTER — Telehealth: Payer: Self-pay | Admitting: Internal Medicine

## 2014-03-12 NOTE — Telephone Encounter (Signed)
Pt called regarding a referral for an Orthopedic doctor and Podiatry. I tried scheduling an appointment for her to see Dr. Orpah CobbAdvani but the next available appointment is not until 5/26, pt states that she is in a lot of pain and can not wait that long. Please contact pt

## 2014-03-13 ENCOUNTER — Telehealth: Payer: Self-pay

## 2014-03-13 ENCOUNTER — Other Ambulatory Visit: Payer: Self-pay

## 2014-03-13 DIAGNOSIS — M25579 Pain in unspecified ankle and joints of unspecified foot: Secondary | ICD-10-CM

## 2014-03-13 NOTE — Telephone Encounter (Signed)
Patient called needing referral to orthopedic and podiatry Is in pain and does not have appt with community health until may Referral put in system for both

## 2014-03-19 ENCOUNTER — Ambulatory Visit (INDEPENDENT_AMBULATORY_CARE_PROVIDER_SITE_OTHER): Payer: Medicaid Other | Admitting: Podiatry

## 2014-03-19 ENCOUNTER — Encounter: Payer: Self-pay | Admitting: Podiatry

## 2014-03-19 VITALS — BP 138/92 | HR 71 | Ht <= 58 in | Wt 158.0 lb

## 2014-03-19 DIAGNOSIS — M21969 Unspecified acquired deformity of unspecified lower leg: Secondary | ICD-10-CM

## 2014-03-19 DIAGNOSIS — M722 Plantar fascial fibromatosis: Secondary | ICD-10-CM

## 2014-03-19 NOTE — Progress Notes (Signed)
Subjective: 48 year old female presents complaining of heel pain left x 1 week.   Objective:  Old X-ray reviewed. Reveal long first and elevated first Metatarsal with anterior advancement of Talus. Increased lateral deviation of the Calcaneocuboid angle left foot.  Findings reveal; Severe STJ pronation left foot. Hypermobile first ray. Left heel pain.   Assessment: Flexible flat foot with STJ hyperpronation. Hypermobile and long first ray.  Heel pain left.  Plan: All options reviewed along with findings. Patient will think about options.

## 2014-03-19 NOTE — Patient Instructions (Signed)
Findings reviewed for left heel pain. Return as needed.

## 2014-04-01 ENCOUNTER — Ambulatory Visit (INDEPENDENT_AMBULATORY_CARE_PROVIDER_SITE_OTHER): Payer: Medicaid Other | Admitting: Podiatry

## 2014-04-01 ENCOUNTER — Ambulatory Visit (INDEPENDENT_AMBULATORY_CARE_PROVIDER_SITE_OTHER): Payer: Medicaid Other

## 2014-04-01 ENCOUNTER — Encounter: Payer: Self-pay | Admitting: Podiatry

## 2014-04-01 VITALS — BP 127/86 | HR 77 | Resp 16 | Ht <= 58 in | Wt 158.0 lb

## 2014-04-01 DIAGNOSIS — M722 Plantar fascial fibromatosis: Secondary | ICD-10-CM

## 2014-04-01 MED ORDER — METHYLPREDNISOLONE (PAK) 4 MG PO TABS: ORAL_TABLET | ORAL | Status: DC

## 2014-04-01 MED ORDER — MELOXICAM 15 MG PO TABS: 15.0000 mg | ORAL_TABLET | Freq: Every day | ORAL | Status: DC

## 2014-04-01 NOTE — Progress Notes (Signed)
   Subjective:    Patient ID: Catherine Patrick, female    DOB: 09/28/1966, 48 y.o.   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 Raynald KempSheard     Review of Systems  All other systems reviewed and are negative.      Objective:   Physical Exam: I reviewed her past medical history medications allergies surgeries social history and review of systems. Pulses are strongly palpable to the left lower extremity. Neurologic sensorium is intact. Muscle strength is 5 over 5 dorsiflexors plantar flexors inverters everters all intrinsic musculature is intact. Orthopedic evaluation demonstrates all joints distal to the ankle have a full range of motion without crepitus. She has pain on direct palpation of the medial calcaneal tubercle of the left heel. Radiographic evaluation does demonstrate mild pes planus with soft tissue increase in density at the plantar fascial calcaneal insertion site indicative of plantar fasciitis.        Assessment & Plan:  Assessment: Plantar fasciitis left foot.  Plan: Injected her left heel today with Kenalog and local anestheti dispensed a prescription for Medrol Dosepak [facility]. Plantar fascial strapping was placed. We discussed appropriate shoe gear stretching sizes ice therapy and shoe gear modifications.

## 2014-04-01 NOTE — Patient Instructions (Signed)
Plantar Fasciitis (Heel Spur Syndrome) with Rehab The plantar fascia is a fibrous, ligament-like, soft-tissue structure that spans the bottom of the foot. Plantar fasciitis is a condition that causes pain in the foot due to inflammation of the tissue. SYMPTOMS   Pain and tenderness on the underneath side of the foot.  Pain that worsens with standing or walking. CAUSES  Plantar fasciitis is caused by irritation and injury to the plantar fascia on the underneath side of the foot. Common mechanisms of injury include:  Direct trauma to bottom of the foot.  Damage to a small nerve that runs under the foot where the main fascia attaches to the heel bone.  Stress placed on the plantar fascia due to bone spurs. RISK INCREASES WITH:   Activities that place stress on the plantar fascia (running, jumping, pivoting, or cutting).  Poor strength and flexibility.  Improperly fitted shoes.  Tight calf muscles.  Flat feet.  Failure to warm-up properly before activity.  Obesity. PREVENTION  Warm up and stretch properly before activity.  Allow for adequate recovery between workouts.  Maintain physical fitness:  Strength, flexibility, and endurance.  Cardiovascular fitness.  Maintain a health body weight.  Avoid stress on the plantar fascia.  Wear properly fitted shoes, including arch supports for individuals who have flat feet. PROGNOSIS  If treated properly, then the symptoms of plantar fasciitis usually resolve without surgery. However, occasionally surgery is necessary. RELATED COMPLICATIONS   Recurrent symptoms that may result in a chronic condition.  Problems of the lower back that are caused by compensating for the injury, such as limping.  Pain or weakness of the foot during push-off following surgery.  Chronic inflammation, scarring, and partial or complete fascia tear, occurring more often from repeated injections. TREATMENT  Treatment initially involves the use of  ice and medication to help reduce pain and inflammation. The use of strengthening and stretching exercises may help reduce pain with activity, especially stretches of the Achilles tendon. These exercises may be performed at home or with a therapist. Your caregiver may recommend that you use heel cups of arch supports to help reduce stress on the plantar fascia. Occasionally, corticosteroid injections are given to reduce inflammation. If symptoms persist for greater than 6 months despite non-surgical (conservative), then surgery may be recommended.  MEDICATION   If pain medication is necessary, then nonsteroidal anti-inflammatory medications, such as aspirin and ibuprofen, or other minor pain relievers, such as acetaminophen, are often recommended.  Do not take pain medication within 7 days before surgery.  Prescription pain relievers may be given if deemed necessary by your caregiver. Use only as directed and only as much as you need.  Corticosteroid injections may be given by your caregiver. These injections should be reserved for the most serious cases, because they may only be given a certain number of times. HEAT AND COLD  Cold treatment (icing) relieves pain and reduces inflammation. Cold treatment should be applied for 10 to 15 minutes every 2 to 3 hours for inflammation and pain and immediately after any activity that aggravates your symptoms. Use ice packs or massage the area with a piece of ice (ice massage).  Heat treatment may be used prior to performing the stretching and strengthening activities prescribed by your caregiver, physical therapist, or athletic trainer. Use a heat pack or soak the injury in warm water. SEEK IMMEDIATE MEDICAL CARE IF:  Treatment seems to offer no benefit, or the condition worsens.  Any medications produce adverse side effects. EXERCISES RANGE   OF MOTION (ROM) AND STRETCHING EXERCISES - Plantar Fasciitis (Heel Spur Syndrome) These exercises may help you  when beginning to rehabilitate your injury. Your symptoms may resolve with or without further involvement from your physician, physical therapist or athletic trainer. While completing these exercises, remember:   Restoring tissue flexibility helps normal motion to return to the joints. This allows healthier, less painful movement and activity.  An effective stretch should be held for at least 30 seconds.  A stretch should never be painful. You should only feel a gentle lengthening or release in the stretched tissue. RANGE OF MOTION - Toe Extension, Flexion  Sit with your right / left leg crossed over your opposite knee.  Grasp your toes and gently pull them back toward the top of your foot. You should feel a stretch on the bottom of your toes and/or foot.  Hold this stretch for __________ seconds.  Now, gently pull your toes toward the bottom of your foot. You should feel a stretch on the top of your toes and or foot.  Hold this stretch for __________ seconds. Repeat __________ times. Complete this stretch __________ times per day.  RANGE OF MOTION - Ankle Dorsiflexion, Active Assisted  Remove shoes and sit on a chair that is preferably not on a carpeted surface.  Place right / left foot under knee. Extend your opposite leg for support.  Keeping your heel down, slide your right / left foot back toward the chair until you feel a stretch at your ankle or calf. If you do not feel a stretch, slide your bottom forward to the edge of the chair, while still keeping your heel down.  Hold this stretch for __________ seconds. Repeat __________ times. Complete this stretch __________ times per day.  STRETCH  Gastroc, Standing  Place hands on wall.  Extend right / left leg, keeping the front knee somewhat bent.  Slightly point your toes inward on your back foot.  Keeping your right / left heel on the floor and your knee straight, shift your weight toward the wall, not allowing your back to  arch.  You should feel a gentle stretch in the right / left calf. Hold this position for __________ seconds. Repeat __________ times. Complete this stretch __________ times per day. STRETCH  Soleus, Standing  Place hands on wall.  Extend right / left leg, keeping the other knee somewhat bent.  Slightly point your toes inward on your back foot.  Keep your right / left heel on the floor, bend your back knee, and slightly shift your weight over the back leg so that you feel a gentle stretch deep in your back calf.  Hold this position for __________ seconds. Repeat __________ times. Complete this stretch __________ times per day. STRETCH  Gastrocsoleus, Standing  Note: This exercise can place a lot of stress on your foot and ankle. Please complete this exercise only if specifically instructed by your caregiver.   Place the ball of your right / left foot on a step, keeping your other foot firmly on the same step.  Hold on to the wall or a rail for balance.  Slowly lift your other foot, allowing your body weight to press your heel down over the edge of the step.  You should feel a stretch in your right / left calf.  Hold this position for __________ seconds.  Repeat this exercise with a slight bend in your right / left knee. Repeat __________ times. Complete this stretch __________ times per day.    STRENGTHENING EXERCISES - Plantar Fasciitis (Heel Spur Syndrome)  These exercises may help you when beginning to rehabilitate your injury. They may resolve your symptoms with or without further involvement from your physician, physical therapist or athletic trainer. While completing these exercises, remember:   Muscles can gain both the endurance and the strength needed for everyday activities through controlled exercises.  Complete these exercises as instructed by your physician, physical therapist or athletic trainer. Progress the resistance and repetitions only as guided. STRENGTH - Towel  Curls  Sit in a chair positioned on a non-carpeted surface.  Place your foot on a towel, keeping your heel on the floor.  Pull the towel toward your heel by only curling your toes. Keep your heel on the floor.  If instructed by your physician, physical therapist or athletic trainer, add ____________________ at the end of the towel. Repeat __________ times. Complete this exercise __________ times per day. STRENGTH - Ankle Inversion  Secure one end of a rubber exercise band/tubing to a fixed object (table, pole). Loop the other end around your foot just before your toes.  Place your fists between your knees. This will focus your strengthening at your ankle.  Slowly, pull your big toe up and in, making sure the band/tubing is positioned to resist the entire motion.  Hold this position for __________ seconds.  Have your muscles resist the band/tubing as it slowly pulls your foot back to the starting position. Repeat __________ times. Complete this exercises __________ times per day.  Document Released: 11/21/2005 Document Revised: 02/13/2012 Document Reviewed: 03/05/2009 [employer] Patient Information 2014 [employer]. Plantar Fasciitis Plantar fasciitis is a common condition that causes foot pain. It is soreness (inflammation) of the band of tough fibrous tissue on the bottom of the foot that runs from the heel bone (calcaneus) to the ball of the foot. The cause of this soreness may be from excessive standing, poor fitting shoes, running on hard surfaces, being overweight, having an abnormal walk, or overuse (this is common in runners) of the painful foot or feet. It is also common in aerobic exercise dancers and ballet dancers. SYMPTOMS  Most people with plantar fasciitis complain of:  Severe pain in the morning on the bottom of their foot especially when taking the first steps out of bed. This pain recedes after a few minutes of walking.  Severe pain is experienced also during walking  following a long period of inactivity.  Pain is worse when walking barefoot or up stairs DIAGNOSIS   Your caregiver will diagnose this condition by examining and feeling your foot.  Special tests such as X-rays of your foot, are usually not needed. PREVENTION   Consult a sports medicine professional before beginning a new exercise program.  Walking programs offer a good workout. With walking there is a lower chance of overuse injuries common to runners. There is less impact and less jarring of the joints.  Begin all new exercise programs slowly. If problems or pain develop, decrease the amount of time or distance until you are at a comfortable level.  Wear good shoes and replace them regularly.  Stretch your foot and the heel cords at the back of the ankle (Achilles tendon) both before and after exercise.  Run or exercise on even surfaces that are not hard. For example, asphalt is better than pavement.  Do not run barefoot on hard surfaces.  If using a treadmill, vary the incline.  Do not continue to workout if you have foot or joint   problems. Seek professional help if they do not improve. HOME CARE INSTRUCTIONS   Avoid activities that cause you pain until you recover.  Use ice or cold packs on the problem or painful areas after working out.  Only take over-the-counter or prescription medicines for pain, discomfort, or fever as directed by your caregiver.  Soft shoe inserts or athletic shoes with air or gel sole cushions may be helpful.  If problems continue or become more severe, consult a sports medicine caregiver or your own health care provider. Cortisone is a potent anti-inflammatory medication that may be injected into the painful area. You can discuss this treatment with your caregiver. MAKE SURE YOU:   Understand these instructions.  Will watch your condition.  Will get help right away if you are not doing well or get worse. Document Released: 08/16/2001 Document  Revised: 02/13/2012 Document Reviewed: 10/15/2008 [employer] Patient Information 2014 [employer].  

## 2014-04-29 ENCOUNTER — Ambulatory Visit: Payer: Medicaid Other | Admitting: Podiatry

## 2014-05-20 ENCOUNTER — Ambulatory Visit (INDEPENDENT_AMBULATORY_CARE_PROVIDER_SITE_OTHER): Payer: Medicaid Other | Admitting: Podiatry

## 2014-05-20 ENCOUNTER — Encounter: Payer: Self-pay | Admitting: Podiatry

## 2014-05-20 VITALS — BP 116/80 | HR 88 | Resp 16

## 2014-05-20 DIAGNOSIS — M722 Plantar fascial fibromatosis: Secondary | ICD-10-CM

## 2014-05-20 NOTE — Progress Notes (Signed)
She presents today for followup of her plantar fasciitis. She states that her feet were doing good until she stops taking the [facility] then it recurred. She hasn't realized that she was supposed to continue the [facility].  Objective: Palpation medial calcaneal tubercle. Pulses palpable bilateral. No calf pain.  Assessment: Pain in limb secondary to plantar fasciitis.  Plan: Continue the use of MOBIC followup with me in 4 weeks for an injection if necessary.

## 2014-06-24 ENCOUNTER — Ambulatory Visit: Payer: Medicaid Other | Admitting: Podiatry

## 2014-09-12 ENCOUNTER — Ambulatory Visit (HOSPITAL_COMMUNITY)
Admission: RE | Admit: 2014-09-12 | Discharge: 2014-09-12 | Disposition: A | Discharge status: Home / Self Care | Payer: Medicaid Other | Source: Ambulatory Visit | Attending: Internal Medicine | Admitting: Internal Medicine

## 2014-09-12 ENCOUNTER — Encounter: Payer: Self-pay | Admitting: Internal Medicine

## 2014-09-12 ENCOUNTER — Ambulatory Visit: Payer: Medicaid Other | Attending: Internal Medicine | Admitting: Internal Medicine

## 2014-09-12 VITALS — BP 119/85 | HR 70 | Temp 98.3°F | Resp 16 | Wt 172.0 lb

## 2014-09-12 DIAGNOSIS — G44209 Tension-type headache, unspecified, not intractable: Secondary | ICD-10-CM | POA: Diagnosis not present

## 2014-09-12 DIAGNOSIS — Z79899 Other long term (current) drug therapy: Secondary | ICD-10-CM | POA: Diagnosis not present

## 2014-09-12 DIAGNOSIS — M25561 Pain in right knee: Secondary | ICD-10-CM | POA: Diagnosis not present

## 2014-09-12 DIAGNOSIS — Z87442 Personal history of urinary calculi: Secondary | ICD-10-CM | POA: Diagnosis not present

## 2014-09-12 DIAGNOSIS — Z23 Encounter for immunization: Secondary | ICD-10-CM

## 2014-09-12 DIAGNOSIS — R51 Headache: Secondary | ICD-10-CM | POA: Diagnosis present

## 2014-09-12 MED ORDER — AMITRIPTYLINE HCL 25 MG PO TABS: 25.0000 mg | ORAL_TABLET | Freq: Every day | ORAL | Status: DC

## 2014-09-12 NOTE — Progress Notes (Signed)
MRN: [id number] Name: Catherine Patrick  Sex: female Age: 48 y.o. DOB: 05/21/1966  Allergies: Review of patient's allergies indicates no known allergies.  Chief Complaint  Patient presents with  . Headache  . Knee Pain    HPI: Patient is 48 y.o. female who has is she of plantar fasciitis of the left foot currently following up with podiatrist as per patient she also was given a steroid injection and is taking meloxicam for pain, she also reported to have right knee pain on and off for several months she denies any fall or trauma, does report on and off headache which is bandlike pressure although from front to back and neck denies any nausea vomiting visual changes numbness weakness. Patient took Tylenol with some improvement in the symptoms and today her headache is better.  Past Medical History  Diagnosis Date  . Kidney stones     Past Surgical History  Procedure Laterality Date  . Hernia repair    . Cesarean section        Medication List       This list is accurate as of: 09/12/14 11:58 AM.  Always use your most recent med list.               amitriptyline 25 MG tablet  Commonly known as:  ELAVIL  Take 1 tablet (25 mg total) by mouth at bedtime.     meloxicam 15 MG tablet  Commonly known as:  MOBIC  Take 1 tablet (15 mg total) by mouth daily.        Meds ordered this encounter  Medications  . amitriptyline (ELAVIL) 25 MG tablet    Sig: Take 1 tablet (25 mg total) by mouth at bedtime.    Dispense:  30 tablet    Refill:  3     There is no immunization history on file for this patient.  No family history on file.  History  Substance Use Topics  . Smoking status: Never Smoker   . Smokeless tobacco: Never Used  . Alcohol Use: No    Review of Systems   As noted in HPI  Filed Vitals:   09/12/14 1140  BP: 119/85  Pulse: 70  Temp: 98.3 F (36.8 C)  Resp: 16    Physical Exam  Physical Exam  Constitutional: She is oriented to person, place,  and time. No distress.  Eyes: EOM are normal. Pupils are equal, round, and reactive to light.  Cardiovascular: Normal rate and regular rhythm.   Pulmonary/Chest: Breath sounds normal. No respiratory distress. She has no wheezes. She has no rales.  Musculoskeletal: She exhibits no edema.  Right knee crepitation+ minimal tenderness no swelling   Neurological: She is alert and oriented to person, place, and time. She has normal reflexes. No cranial nerve deficit.    CBC    Component Value Date/Time   WBC 10.2 05/30/2013 1749   RBC 4.56 05/30/2013 1749   HGB 13.4 05/30/2013 1749   HCT 40.8 05/30/2013 1749   PLT 205 05/30/2013 1749   MCV 89.5 05/30/2013 1749   LYMPHSABS 2.2 05/30/2013 1749   MONOABS 0.7 05/30/2013 1749   EOSABS 0.1 05/30/2013 1749   BASOSABS 0.0 05/30/2013 1749    CMP     Component Value Date/Time   NA 138 05/30/2013 1749   K 4.3 05/30/2013 1749   CL 101 05/30/2013 1749   CO2 28 05/30/2013 1749   GLUCOSE 84 05/30/2013 1749   BUN 11 05/30/2013 1749  CREATININE 0.82 05/30/2013 1749   CALCIUM 9.3 05/30/2013 1749   PROT 7.3 05/30/2013 1749   ALBUMIN 3.2* 05/30/2013 1749   AST 23 05/30/2013 1749   ALT 24 05/30/2013 1749   ALKPHOS 107 05/30/2013 1749   BILITOT 0.5 05/30/2013 1749   GFRNONAA 83* 05/30/2013 1749   GFRAA >90 05/30/2013 1749    No results found for this basename: chol, tri, ldl    No components found with this basename: hga1c    Lab Results  Component Value Date/Time   AST 23 05/30/2013  5:49 PM    Assessment and Plan  Right knee pain - Plan: DG Knee Complete 4 Views Right, patient is already on meloxicam.  Tension-type headache, not intractable, unspecified chronicity pattern - Plan: Trial of amitriptyline (ELAVIL) 25 MG tablet each bedtime  Needs flu shot   Health Maintenance   -Influenza shot today  Return in about 3 months (around 12/13/2014), or if symptoms worsen or fail to improve.  Doris CheadleADVANI, Jakyra Kenealy, MD

## 2014-09-12 NOTE — Progress Notes (Signed)
Patient complains of headache past three days Also complains of right knee pain and left hip pain

## 2014-09-18 ENCOUNTER — Telehealth: Payer: Self-pay | Admitting: *Deleted

## 2014-09-18 ENCOUNTER — Ambulatory Visit (INDEPENDENT_AMBULATORY_CARE_PROVIDER_SITE_OTHER): Payer: Medicaid Other | Admitting: Podiatry

## 2014-09-18 ENCOUNTER — Encounter: Payer: Self-pay | Admitting: Podiatry

## 2014-09-18 VITALS — BP 131/91 | HR 75 | Resp 12 | Ht <= 58 in | Wt 170.0 lb

## 2014-09-18 DIAGNOSIS — M722 Plantar fascial fibromatosis: Secondary | ICD-10-CM | POA: Diagnosis not present

## 2014-09-18 MED ORDER — MELOXICAM 15 MG PO TABS: 15.0000 mg | ORAL_TABLET | Freq: Every day | ORAL | Status: DC

## 2014-09-18 NOTE — Telephone Encounter (Signed)
Three month only.

## 2014-09-18 NOTE — Telephone Encounter (Signed)
I have a question.  I returned her call.  She stated, "I have a handicap application that I need filled out by Dr. Al CorpusHyatt.  How do I go about getting him to fill it out?"  I informed her he normally doesn't write handicap requisitions for Plantar Fasciitis.  I will ask him, if he okays it, you can come by the office to pick up the requisiton. I;ll call you back.  She stated okay.

## 2014-09-19 NOTE — Progress Notes (Signed)
She presents today states that her plantar fasciitis has returned.  Objective: Vital signs are stable she is alert and oriented x3. She is wearing platform shoes with no back. Pulses are palpable left foot. She has pain on palpation medial continued tubercle of the left heel.  Assessment: Pain in limb secondary to plantar fasciitis left.  Plan: Reinjected her left heel today we started her meloxicam and placed her in a plantar fascial strapping. We discussed appropriate shoe gear stretching exercises and ice therapy. Discussed the fact that we need she use with heel counters on a regular basis. Flip flops sandals slides will not suffice. I will followup with her in one month at which time we may prescribed a set of orthotics.

## 2014-09-22 NOTE — Telephone Encounter (Signed)
I called and informed her that Dr. Maren BeachHyatt okayed a handicap parking permit for 3 months.  You will have to come by the office to pick up the requisition form [facility]  She asked, "Can you put it in the mail?"  I told her no, you will have to come by to pick it up.

## 2014-10-23 ENCOUNTER — Ambulatory Visit: Payer: Medicaid Other | Admitting: Podiatry

## 2014-10-23 ENCOUNTER — Ambulatory Visit (INDEPENDENT_AMBULATORY_CARE_PROVIDER_SITE_OTHER): Payer: Medicaid Other | Admitting: Podiatry

## 2014-10-23 VITALS — BP 121/82 | HR 103 | Resp 16

## 2014-10-23 DIAGNOSIS — M722 Plantar fascial fibromatosis: Secondary | ICD-10-CM

## 2014-10-24 NOTE — Progress Notes (Signed)
She presents today for follow-up of her plantar fasciitis. She states when she started working burning started again. She has tried conservative therapies at home but it has failed to render her asymptomatic.  Objective: Vital signs are stable she is alert and oriented 3. Pulses are palpable bilateral. Pain on palpation medial calcaneal tubercles bilateral.  Assessment: Neuritis with plantar fasciitis bilateral.  Plan: Discussed etiology pathology conservative versus surgical therapies. I highly recommended orthotics. I also discussed the possible need for alcohol injections. I will follow-up with her in the near future when she has decided which direction she would like to go.

## 2014-12-09 ENCOUNTER — Ambulatory Visit: Payer: Medicaid Other | Admitting: Internal Medicine

## 2015-01-12 ENCOUNTER — Ambulatory Visit: Payer: Medicaid Other | Admitting: Internal Medicine

## 2015-01-20 ENCOUNTER — Emergency Department (HOSPITAL_COMMUNITY)
Admission: EM | Admit: 2015-01-20 | Discharge: 2015-01-20 | Disposition: A | Discharge status: Home / Self Care | Payer: Medicaid Other | Attending: Emergency Medicine | Admitting: Emergency Medicine

## 2015-01-20 ENCOUNTER — Encounter (HOSPITAL_COMMUNITY): Payer: Self-pay

## 2015-01-20 DIAGNOSIS — M722 Plantar fascial fibromatosis: Secondary | ICD-10-CM

## 2015-01-20 DIAGNOSIS — M1711 Unilateral primary osteoarthritis, right knee: Secondary | ICD-10-CM | POA: Diagnosis not present

## 2015-01-20 DIAGNOSIS — Z87442 Personal history of urinary calculi: Secondary | ICD-10-CM | POA: Insufficient documentation

## 2015-01-20 DIAGNOSIS — M79672 Pain in left foot: Secondary | ICD-10-CM | POA: Diagnosis present

## 2015-01-20 DIAGNOSIS — M791 Myalgia: Secondary | ICD-10-CM | POA: Insufficient documentation

## 2015-01-20 MED ORDER — MELOXICAM 15 MG PO TABS: 15.0000 mg | ORAL_TABLET | Freq: Every day | ORAL | Status: DC

## 2015-01-20 NOTE — Discharge Instructions (Signed)
Stop taking ibuprofen and, instead, take Mobic as prescribed. Apply ice to your knee approximately 3 times per day to limit swelling. You may also wear a knee sleeve for comfort and stability. Stretch your left foot over a ball the size of a tennis ball. Do this approximately 2-3 times per day. Recommend that you follow-up with orthopedics for further evaluation of your symptoms.  Osteoarthritis Osteoarthritis is a disease that causes soreness and inflammation of a joint. It occurs when the cartilage at the affected joint wears down. Cartilage acts as a cushion, covering the ends of bones where they meet to form a joint. Osteoarthritis is the most common form of arthritis. It often occurs in older people. The joints affected most often by this condition include those in the:  Ends of the fingers.  Thumbs.  Neck.  Lower back.  Knees.  Hips. CAUSES  Over time, the cartilage that covers the ends of bones begins to wear away. This causes bone to rub on bone, producing pain and stiffness in the affected joints.  RISK FACTORS Certain factors can increase your chances of having osteoarthritis, including:  Older age.  Excessive body weight.  Overuse of joints.  Previous joint injury. SIGNS AND SYMPTOMS   Pain, swelling, and stiffness in the joint.  Over time, the joint may lose its normal shape.  Small deposits of bone (osteophytes) may grow on the edges of the joint.  Bits of bone or cartilage can break off and float inside the joint space. This may cause more pain and damage. DIAGNOSIS  Your health care provider will do a physical exam and ask about your symptoms. Various tests may be ordered, such as:  X-rays of the affected joint.  An MRI scan.  Blood tests to rule out other types of arthritis.  Joint fluid tests. This involves using a needle to draw fluid from the joint and examining the fluid under a microscope. TREATMENT  Goals of treatment are to control pain and  improve joint function. Treatment plans may include:  A prescribed exercise program that allows for rest and joint relief.  A weight control plan.  Pain relief techniques, such as:  Properly applied heat and cold.  Electric pulses delivered to nerve endings under the skin (transcutaneous electrical nerve stimulation [TENS]).  Massage.  Certain nutritional supplements.  Medicines to control pain, such as:  Acetaminophen.  Nonsteroidal anti-inflammatory drugs (NSAIDs), such as naproxen.  Narcotic or central-acting agents, such as tramadol.  Corticosteroids. These can be given orally or as an injection.  Surgery to reposition the bones and relieve pain (osteotomy) or to remove loose pieces of bone and cartilage. Joint replacement may be needed in advanced states of osteoarthritis. HOME CARE INSTRUCTIONS   Take medicines only as directed by your health care provider.  Maintain a healthy weight. Follow your health care provider's instructions for weight control. This may include dietary instructions.  Exercise as directed. Your health care provider can recommend specific types of exercise. These may include:  Strengthening exercises. These are done to strengthen the muscles that support joints affected by arthritis. They can be performed with weights or with exercise bands to add resistance.  Aerobic activities. These are exercises, such as brisk walking or low-impact aerobics, that get your heart pumping.  Range-of-motion activities. These keep your joints limber.  Balance and agility exercises. These help you maintain daily living skills.  Rest your affected joints as directed by your health care provider.  Keep all follow-up visits as  directed by your health care provider. SEEK MEDICAL CARE IF:   Your skin turns red.  You develop a rash in addition to your joint pain.  You have worsening joint pain.  You have a fever along with joint or muscle aches. SEEK  IMMEDIATE MEDICAL CARE IF:  You have a significant loss of weight or appetite.  You have night sweats. FOR MORE INFORMATION   National Institute of Arthritis and Musculoskeletal and Skin Diseases: www.niams.http://www.myers.net/  [employer] on Aging: https://walker.com/  American College of Rheumatology: www.rheumatology.org Document Released: 11/21/2005 Document Revised: 04/07/2014 Document Reviewed: 07/29/2013 [facility] Patient Information 2015 [place], [place]. This information is not intended to replace advice given to you by your health care provider. Make sure you discuss any questions you have with your health care provider. Plantar Fasciitis Plantar fasciitis is a common condition that causes foot pain. It is soreness (inflammation) of the band of tough fibrous tissue on the bottom of the foot that runs from the heel bone (calcaneus) to the ball of the foot. The cause of this soreness may be from excessive standing, poor fitting shoes, running on hard surfaces, being overweight, having an abnormal walk, or overuse (this is common in runners) of the painful foot or feet. It is also common in aerobic exercise dancers and ballet dancers. SYMPTOMS  Most people with plantar fasciitis complain of:  Severe pain in the morning on the bottom of their foot especially when taking the first steps out of bed. This pain recedes after a few minutes of walking.  Severe pain is experienced also during walking following a long period of inactivity.  Pain is worse when walking barefoot or up stairs DIAGNOSIS   Your caregiver will diagnose this condition by examining and feeling your foot.  Special tests such as X-rays of your foot, are usually not needed. PREVENTION   Consult a sports medicine professional before beginning a new exercise program.  Walking programs offer a good workout. With walking there is a lower chance of overuse injuries common to runners. There is less impact and less jarring of the  joints.  Begin all new exercise programs slowly. If problems or pain develop, decrease the amount of time or distance until you are at a comfortable level.  Wear good shoes and replace them regularly.  Stretch your foot and the heel cords at the back of the ankle (Achilles tendon) both before and after exercise.  Run or exercise on even surfaces that are not hard. For example, asphalt is better than pavement.  Do not run barefoot on hard surfaces.  If using a treadmill, vary the incline.  Do not continue to workout if you have foot or joint problems. Seek professional help if they do not improve. HOME CARE INSTRUCTIONS   Avoid activities that cause you pain until you recover.  Use ice or cold packs on the problem or painful areas after working out.  Only take over-the-counter or prescription medicines for pain, discomfort, or fever as directed by your caregiver.  Soft shoe inserts or athletic shoes with air or gel sole cushions may be helpful.  If problems continue or become more severe, consult a sports medicine caregiver or your own health care provider. Cortisone is a potent anti-inflammatory medication that may be injected into the painful area. You can discuss this treatment with your caregiver. MAKE SURE YOU:   Understand these instructions.  Will watch your condition.  Will get help right away if you are not doing well or  get worse. Document Released: 08/16/2001 Document Revised: 02/13/2012 Document Reviewed: 10/15/2008 [facility] Patient Information 2015 [place], [place]. This information is not intended to replace advice given to you by your health care provider. Make sure you discuss any questions you have with your health care provider. RICE: Routine Care for Injuries The routine care of many injuries includes Rest, Ice, Compression, and Elevation (RICE). HOME CARE INSTRUCTIONS  Rest is needed to allow your body to heal. Routine activities can usually be resumed when  comfortable. Injured tendons and bones can take up to 6 weeks to heal. Tendons are the cord-like structures that attach muscle to bone.  Ice following an injury helps keep the swelling down and reduces pain.  Put ice in a plastic bag.  Place a towel between your skin and the bag.  Leave the ice on for 15-20 minutes, 3-4 times a day, or as directed by your health care provider. Do this while awake, for the first 24 to 48 hours. After that, continue as directed by your caregiver.  Compression helps keep swelling down. It also gives support and helps with discomfort. If an elastic bandage has been applied, it should be removed and reapplied every 3 to 4 hours. It should not be applied tightly, but firmly enough to keep swelling down. Watch fingers or toes for swelling, bluish discoloration, coldness, numbness, or excessive pain. If any of these problems occur, remove the bandage and reapply loosely. Contact your caregiver if these problems continue.  Elevation helps reduce swelling and decreases pain. With extremities, such as the arms, hands, legs, and feet, the injured area should be placed near or above the level of the heart, if possible. SEEK IMMEDIATE MEDICAL CARE IF:  You have persistent pain and swelling.  You develop redness, numbness, or unexpected weakness.  Your symptoms are getting worse rather than improving after several days. These symptoms may indicate that further evaluation or further X-rays are needed. Sometimes, X-rays may not show a small broken bone (fracture) until 1 week or 10 days later. Make a follow-up appointment with your caregiver. Ask when your X-ray results will be ready. Make sure you get your X-ray results. Document Released: 03/05/2001 Document Revised: 11/26/2013 Document Reviewed: 04/22/2011 [facility] Patient Information 2015 [place], [place]. This information is not intended to replace advice given to you by your health care provider. Make sure you discuss any  questions you have with your health care provider.

## 2015-01-20 NOTE — ED Notes (Signed)
Pt complains of right knee pain and left heel pain for several months, she states that she's on her feet for several hours a day, no acute injury

## 2015-01-21 NOTE — ED Provider Notes (Signed)
CSN: [id number]     Arrival date & time 01/20/15  0422 History   First MD Initiated Contact with Patient 01/20/15 [phone]     Chief Complaint  Patient presents with  . Knee Pain  . Foot Pain    (Consider location/radiation/quality/duration/timing/severity/associated sxs/prior Treatment) Patient is a 49 y.o. female presenting with knee pain and lower extremity pain. The history is provided by the patient. No language interpreter was used.  Knee Pain Location:  Knee Time since incident: "many months" Injury: no   Knee location:  R knee Pain details:    Quality:  Aching   Radiates to:  Does not radiate   Severity:  Moderate   Onset quality:  Gradual   Timing:  Intermittent   Progression:  Waxing and waning Chronicity:  Recurrent Dislocation: no   Prior injury to area:  No Relieved by:  NSAIDs Worsened by:  Activity and bearing weight Ineffective treatments:  Rest Associated symptoms: stiffness and swelling   Associated symptoms: no decreased ROM, no fever, no muscle weakness, no numbness and no tingling   Foot Pain This is a recurrent problem. Episode onset: "many months" ago. The problem occurs intermittently. The problem has been waxing and waning. Associated symptoms include arthralgias, joint swelling and myalgias. Pertinent negatives include no fever, numbness or weakness. The symptoms are aggravated by walking and standing. She has tried NSAIDs for the symptoms. The treatment provided mild relief.    Past Medical History  Diagnosis Date  . Kidney stones    Past Surgical History  Procedure Laterality Date  . Hernia repair    . Cesarean section     History reviewed. No pertinent family history. History  Substance Use Topics  . Smoking status: Never Smoker   . Smokeless tobacco: Never Used  . Alcohol Use: No   OB History    Gravida Para Term Preterm AB TAB SAB Ectopic Multiple Living            3      Review of Systems  Constitutional: Negative for fever.    Musculoskeletal: Positive for myalgias, joint swelling, arthralgias and stiffness.  Skin: Negative for color change.  Neurological: Negative for weakness and numbness.  All other systems reviewed and are negative.   Allergies  Review of patient's allergies indicates no known allergies.  Home Medications   Prior to Admission medications   Medication Sig Start Date End Date Taking? Authorizing Provider  ibuprofen (ADVIL,MOTRIN) 200 MG tablet Take 800 mg by mouth every 6 (six) hours as needed (For pain.).   Yes Historical Provider, MD  meloxicam (MOBIC) 15 MG tablet Take 1 tablet (15 mg total) by mouth daily. 01/20/15   Antony Madura, PA-C   BP 159/89 mmHg  Pulse 73  Temp(Src) 98.3 F (36.8 C) (Oral)  Resp 18  Ht  (1.422 m)  Wt 170 lb (77.111 kg)  BMI 38.13 kg/m2  SpO2 99%  LMP 03/19/2014   Physical Exam  Constitutional: She is oriented to person, place, and time. She appears well-developed and well-nourished. No distress.  Nontoxic/nonseptic appearing  HENT:  Head: Normocephalic and atraumatic.  Eyes: Conjunctivae and EOM are normal. No scleral icterus.  Neck: Normal range of motion.  Cardiovascular: Normal rate, regular rhythm and intact distal pulses.   DP and PT pulses 2+ bilaterally  Pulmonary/Chest: Effort normal. No respiratory distress.  Musculoskeletal: Normal range of motion. She exhibits tenderness.       Right knee: She exhibits normal range of motion, no  swelling, no deformity, no erythema, normal alignment, no LCL laxity, no bony tenderness and no MCL laxity. Tenderness found. Lateral joint line tenderness noted.       Left ankle: Normal.       Legs:      Left foot: There is tenderness (Mild). There is normal range of motion, no bony tenderness, no swelling, no crepitus and no deformity.  Neurological: She is alert and oriented to person, place, and time. She exhibits normal muscle tone. Coordination normal.  Sensation to light touch intact. Patient  ambulatory with normal gait.  Skin: Skin is warm and dry. No rash noted. She is not diaphoretic. No erythema. No pallor.  Psychiatric: She has a normal mood and affect. Her behavior is normal.  Nursing note and vitals reviewed.   ED Course  Procedures (including critical care time) Labs Review Labs Reviewed - No data to display  Imaging Review No results found.   EKG Interpretation None      MDM   Final diagnoses:  Plantar fasciitis of left foot  Osteoarthritis of right knee, unspecified osteoarthritis type    49 year old female presents to the emergency department for further evaluation of persistent right knee pain and left heel pain for several months. Patient is neurovascularly intact. She is ambulatory with normal gait. Sensation to light touch intact. No evidence of septic joint on exam. Patient denies new trauma or injury. Suspect that her symptoms are occupational in nature as she stands for long periods at her job. Right knee pain is consistent with osteoarthritic changes. Left heel pain consistent with plantar fasciitis. Have discussed changing the patient from ibuprofen to Mobic. Have also advised stretching to her left foot by rolling her foot over a tennis ball. Knee sleeve provided for stability as well as orthopedic referral. Return precautions given. Patient agreeable to plan with no unaddressed concerns. Patient discharged in good condition.   Filed Vitals:   01/20/15 0432  BP: 159/89  Pulse: 73  Temp: 98.3 F (36.8 C)  TempSrc: Oral  Resp: 18  Height: 4\' 8"  (1.422 m)  Weight: 170 lb (77.111 kg)  SpO2: 99%     Antony MaduraKelly Trudi Morgenthaler, PA-C 01/21/15 [id number]  Tomasita CrumbleAdeleke Oni, MD 01/21/15 [id number]

## 2015-04-01 ENCOUNTER — Encounter: Payer: Self-pay | Admitting: Internal Medicine

## 2015-04-01 ENCOUNTER — Ambulatory Visit: Payer: Medicaid Other | Attending: Internal Medicine

## 2015-04-01 ENCOUNTER — Ambulatory Visit (HOSPITAL_BASED_OUTPATIENT_CLINIC_OR_DEPARTMENT_OTHER): Payer: Medicaid Other | Admitting: Internal Medicine

## 2015-04-01 VITALS — BP 125/85 | HR 79 | Temp 98.0°F | Resp 16 | Wt 180.2 lb

## 2015-04-01 DIAGNOSIS — M179 Osteoarthritis of knee, unspecified: Secondary | ICD-10-CM | POA: Insufficient documentation

## 2015-04-01 DIAGNOSIS — Z139 Encounter for screening, unspecified: Secondary | ICD-10-CM

## 2015-04-01 DIAGNOSIS — H538 Other visual disturbances: Secondary | ICD-10-CM | POA: Insufficient documentation

## 2015-04-01 DIAGNOSIS — M1711 Unilateral primary osteoarthritis, right knee: Secondary | ICD-10-CM

## 2015-04-01 DIAGNOSIS — M722 Plantar fascial fibromatosis: Secondary | ICD-10-CM | POA: Diagnosis not present

## 2015-04-01 DIAGNOSIS — Z1231 Encounter for screening mammogram for malignant neoplasm of breast: Secondary | ICD-10-CM

## 2015-04-01 LAB — COMPLETE METABOLIC PANEL WITH GFR
ALBUMIN: 4 g/dL (ref 3.5–5.2)
ALK PHOS: 116 U/L (ref 39–117)
ALT: 15 U/L (ref 0–35)
AST: 18 U/L (ref 0–37)
BUN: 19 mg/dL (ref 6–23)
CALCIUM: 9.6 mg/dL (ref 8.4–10.5)
CHLORIDE: 103 meq/L (ref 96–112)
CO2: 27 mEq/L (ref 19–32)
Creat: 0.61 mg/dL (ref 0.50–1.10)
GFR, Est Non African American: >89 mL/min
Glucose, Bld: 88 mg/dL (ref 70–99)
Potassium: 4.4 mEq/L (ref 3.5–5.3)
Sodium: 139 mEq/L (ref 135–145)
Total Bilirubin: 0.3 mg/dL (ref 0.2–1.2)
Total Protein: 7.1 g/dL (ref 6.0–8.3)

## 2015-04-01 MED ORDER — IBUPROFEN 600 MG PO TABS: 600.0000 mg | ORAL_TABLET | Freq: Four times a day (QID) | ORAL | Status: DC | PRN

## 2015-04-01 MED ORDER — IBUPROFEN 400 MG PO TABS: 400.0000 mg | ORAL_TABLET | Freq: Four times a day (QID) | ORAL | Status: DC | PRN

## 2015-04-01 NOTE — Progress Notes (Signed)
Patient complains of right knee pain with some swelling behind the knee Patient also complains of left heel pain Patient states the pain makes it difficult to stand up first thing in the morning

## 2015-04-01 NOTE — Patient Instructions (Signed)
Plantar Fasciitis  Plantar fasciitis is a common condition that causes foot pain. It is soreness (inflammation) of the band of tough fibrous tissue on the bottom of the foot that runs from the heel bone (calcaneus) to the ball of the foot. The cause of this soreness may be from excessive standing, poor fitting shoes, running on hard surfaces, being overweight, having an abnormal walk, or overuse (this is common in runners) of the painful foot or feet. It is also common in aerobic exercise dancers and ballet dancers.  SYMPTOMS   Most people with plantar fasciitis complain of:   Severe pain in the morning on the bottom of their foot especially when taking the first steps out of bed. This pain recedes after a few minutes of walking.   Severe pain is experienced also during walking following a long period of inactivity.   Pain is worse when walking barefoot or up stairs  DIAGNOSIS    Your caregiver will diagnose this condition by examining and feeling your foot.   Special tests such as X-rays of your foot, are usually not needed.  PREVENTION    Consult a sports medicine professional before beginning a new exercise program.   Walking programs offer a good workout. With walking there is a lower chance of overuse injuries common to runners. There is less impact and less jarring of the joints.   Begin all new exercise programs slowly. If problems or pain develop, decrease the amount of time or distance until you are at a comfortable level.   Wear good shoes and replace them regularly.   Stretch your foot and the heel cords at the back of the ankle (Achilles tendon) both before and after exercise.   Run or exercise on even surfaces that are not hard. For example, asphalt is better than pavement.   Do not run barefoot on hard surfaces.   If using a treadmill, vary the incline.   Do not continue to workout if you have foot or joint problems. Seek professional help if they do not improve.  HOME CARE INSTRUCTIONS     Avoid activities that cause you pain until you recover.   Use ice or cold packs on the problem or painful areas after working out.   Only take over-the-counter or prescription medicines for pain, discomfort, or fever as directed by your caregiver.   Soft shoe inserts or athletic shoes with air or gel sole cushions may be helpful.   If problems continue or become more severe, consult a sports medicine caregiver or your own health care provider. Cortisone is a potent anti-inflammatory medication that may be injected into the painful area. You can discuss this treatment with your caregiver.  MAKE SURE YOU:    Understand these instructions.   Will watch your condition.   Will get help right away if you are not doing well or get worse.  Document Released: 08/16/2001 Document Revised: 02/13/2012 Document Reviewed: 10/15/2008  ExitCare Patient Information 2015 [employer], [employer]. This information is not intended to replace advice given to you by your health care provider. Make sure you discuss any questions you have with your health care provider.

## 2015-04-01 NOTE — Progress Notes (Signed)
MRN: [id number] Name: Catherine Patrick  Sex: female Age: 49 y.o. DOB: 03/04/1966  Allergies: Review of patient's allergies indicates no known allergies.  Chief Complaint  Patient presents with  . Knee Pain    HPI: Patient is 49 y.o. female who comes today for followup, has history of plantar fasciitis of left foot, chronic right knee pain, EMR reviewed patient had x-ray done which reported osteoarthritis, today she is requesting refill on her pain medication, as per patient in the past she got referral from ER to see orthopedic Dr. But she lost the referral paper, she denies any recent fall or trauma, patient has not had any recent blood work done, she is also requesting referral to see an ophthalmologist has occasional blurry vision, she's wearing prescription glasses.  Past Medical History  Diagnosis Date  . Kidney stones     Past Surgical History  Procedure Laterality Date  . Hernia repair    . Cesarean section        Medication List       This list is accurate as of: 04/01/15  3:15 PM.  Always use your most recent med list.               ibuprofen 400 MG tablet  Commonly known as:  ADVIL,MOTRIN  Take 1 tablet (400 mg total) by mouth every 6 (six) hours as needed (For pain.).     meloxicam 15 MG tablet  Commonly known as:  MOBIC  Take 1 tablet (15 mg total) by mouth daily.        Meds ordered this encounter  Medications  . ibuprofen (ADVIL,MOTRIN) 400 MG tablet    Sig: Take 1 tablet (400 mg total) by mouth every 6 (six) hours as needed (For pain.).    Dispense:  60 tablet    Refill:  1    Immunization History  Administered Date(s) Administered  . Influenza,inj,Quad PF,36+ Mos 09/12/2014    History reviewed. No pertinent family history.  History  Substance Use Topics  . Smoking status: Never Smoker   . Smokeless tobacco: Never Used  . Alcohol Use: No    Review of Systems   As noted in HPI  Filed Vitals:   04/01/15 1447  BP: 125/85  Pulse:  79  Temp: 98 F (36.7 C)  Resp: 16    Physical Exam  Physical Exam  Constitutional: No distress.  Eyes: EOM are normal.  Cardiovascular: Normal rate and regular rhythm.   Pulmonary/Chest: Breath sounds normal. No respiratory distress. She has no wheezes. She has no rales.  Musculoskeletal:  Right knee crepitation minimal tenderness  Left foot heel  tenderness    CBC    Component Value Date/Time   WBC 10.2 05/30/2013 1749   RBC 4.56 05/30/2013 1749   HGB 13.4 05/30/2013 1749   HCT 40.8 05/30/2013 1749   PLT 205 05/30/2013 1749   MCV 89.5 05/30/2013 1749   LYMPHSABS 2.2 05/30/2013 1749   MONOABS 0.7 05/30/2013 1749   EOSABS 0.1 05/30/2013 1749   BASOSABS 0.0 05/30/2013 1749    CMP     Component Value Date/Time   NA 138 05/30/2013 1749   K 4.3 05/30/2013 1749   CL 101 05/30/2013 1749   CO2 28 05/30/2013 1749   GLUCOSE 84 05/30/2013 1749   BUN 11 05/30/2013 1749   CREATININE 0.82 05/30/2013 1749   CALCIUM 9.3 05/30/2013 1749   PROT 7.3 05/30/2013 1749   ALBUMIN 3.2* 05/30/2013 1749   AST 23  05/30/2013 1749   ALT 24 05/30/2013 1749   ALKPHOS 107 05/30/2013 1749   BILITOT 0.5 05/30/2013 1749   GFRNONAA 83* 05/30/2013 1749   GFRAA >90 05/30/2013 1749    No results found for: CHOL  Lab Results  Component Value Date/Time   HGBA1C 5.8* 01/21/2013 07:11 PM    Lab Results  Component Value Date/Time   AST 23 05/30/2013 05:49 PM    Assessment and Plan  Osteoarthritis of right knee, unspecified osteoarthritis type - Plan: ibuprofen (ADVIL,MOTRIN) 400 MG tablet, Ambulatory referral to Orthopedic Surgery  Plantar fasciitis of left foot - Plan:advise patient to wear comfortable shoes, she's given handout for exercises, ibuprofen (ADVIL,MOTRIN) 400 MG tablet, Ambulatory referral to Orthopedic Surgery  Blurry vision - Plan: Ambulatory referral to Ophthalmology  Encounter for screening mammogram for breast cancer - Plan: MM DIGITAL SCREENING  BILATERAL  Screening - Plan: COMPLETE METABOLIC PANEL WITH GFR, Vit D  25 hydroxy (rtn osteoporosis monitoring)    Return in about 3 months (around 07/01/2015), or if symptoms worsen or fail to improve.   This note has been created with Education officer, environmental. Any transcriptional errors are unintentional.    Doris Cheadle, MD

## 2015-04-02 ENCOUNTER — Telehealth: Payer: Self-pay

## 2015-04-02 LAB — VITAMIN D 25 HYDROXY (VIT D DEFICIENCY, FRACTURES): Vit D, 25-Hydroxy: 17 ng/mL — ABNORMAL LOW (ref 30–100)

## 2015-04-02 MED ORDER — VITAMIN D (ERGOCALCIFEROL) 1.25 MG (50000 UNIT) PO CAPS: 50000.0000 [IU] | ORAL_CAPSULE | ORAL | Status: DC

## 2015-04-02 NOTE — Telephone Encounter (Signed)
Patient is aware of her lab results Prescription sent to pharmacy on file 

## 2015-04-02 NOTE — Telephone Encounter (Signed)
-----   Message from Deepak Advani, MD sent at 04/02/2015  9:21 AM EDT ----- Blood work reviewed, noticed low vitamin D, call patient advise to start ergocalciferol 50,000 units once a week for the duration of  12 weeks, then take OTC vitamin d 2000 units daily.  

## 2015-04-07 ENCOUNTER — Ambulatory Visit: Payer: Medicaid Other | Attending: Internal Medicine | Admitting: *Deleted

## 2015-04-07 DIAGNOSIS — Z111 Encounter for screening for respiratory tuberculosis: Secondary | ICD-10-CM | POA: Insufficient documentation

## 2015-04-07 DIAGNOSIS — Z0000 Encounter for general adult medical examination without abnormal findings: Secondary | ICD-10-CM

## 2015-04-07 NOTE — Progress Notes (Signed)
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  Patient aware that she needs to return in 48 hours for PPD reading

## 2015-04-09 ENCOUNTER — Ambulatory Visit: Payer: Medicaid Other | Attending: Internal Medicine

## 2015-04-09 DIAGNOSIS — Z111 Encounter for screening for respiratory tuberculosis: Secondary | ICD-10-CM | POA: Diagnosis present

## 2015-04-09 NOTE — Progress Notes (Signed)
Patient seen today to have TB skin test resulted.  Left forearm was examined and no wheel present.  Test was negative.

## 2015-04-17 ENCOUNTER — Ambulatory Visit (INDEPENDENT_AMBULATORY_CARE_PROVIDER_SITE_OTHER): Payer: Medicaid Other | Admitting: Family Medicine

## 2015-04-17 ENCOUNTER — Encounter: Payer: Self-pay | Admitting: Family Medicine

## 2015-04-17 VITALS — BP 158/94 | HR 71 | Ht <= 58 in | Wt 176.0 lb

## 2015-04-17 DIAGNOSIS — M1711 Unilateral primary osteoarthritis, right knee: Secondary | ICD-10-CM

## 2015-04-17 DIAGNOSIS — M25561 Pain in right knee: Secondary | ICD-10-CM

## 2015-04-17 DIAGNOSIS — M722 Plantar fascial fibromatosis: Secondary | ICD-10-CM

## 2015-04-17 MED ORDER — NITROGLYCERIN 0.2 MG/HR TD PT24
MEDICATED_PATCH | TRANSDERMAL | Status: DC
Start: 2015-04-17 — End: 2015-06-16

## 2015-04-17 MED ORDER — METHYLPREDNISOLONE ACETATE 40 MG/ML IJ SUSP
40.0000 mg | Freq: Once | INTRAMUSCULAR | Status: AC
  Administered 2015-04-17: 40 mg via INTRA_ARTICULAR

## 2015-04-17 NOTE — Patient Instructions (Addendum)
You have mild osteoarthritis of the right knee (seen on x-rays). -Cortisone injection today, ice if needed -Continue meloxicam if needed with food -An over-the-counter supportive knee sleeve may help as well -Plan follow-up in 6-8 weeks if needed or sooner if needed  You have left plantar fasciitis and have had 3 injections in the past -We'll try the modified cushioned insoles. Try not to wear the rocker-bottom shoes if you can help it. -Start the nitroglycerin protocol as stated below -Plan follow-up in 6-8 weeks or sooner if needed. -This should gradually get better, do some home stretching exercises as well before getting out of bed. Some go on to need custom fitted insoles  Nitroglycerin Protocol   Apply 1/4 nitroglycerin patch to affected area daily.  Change position of patch within the affected area every 24 hours.  You may experience a headache during the first 1-2 weeks of using the patch, these should subside.  If you experience headaches after beginning nitroglycerin patch treatment, you may take your preferred over the counter pain reliever.  Another side effect of the nitroglycerin patch is skin irritation or rash related to patch adhesive.  Please notify our office if you develop more severe headaches or rash, and stop the patch.  Tendon healing with nitroglycerin patch may require 12 to 24 weeks depending on the extent of injury.  Men should not use if taking Viagra, Cialis, or Levitra.   Do not use if you have migraines or rosacea.

## 2015-04-17 NOTE — Assessment & Plan Note (Signed)
-  Cortisone injection given today, tolerated well. Post-injection cares discussed. -Consider OTC compression sleeve -Rest, ice, elevate -Continue meloxicam as directed -Plan follow-up in 6-8 weeks or sooner prn

## 2015-04-17 NOTE — Progress Notes (Signed)
   Subjective:    Patient ID: Catherine Patrick, female    DOB: 02/24/1966, 49 y.o.   MRN: [id number]  HPI Ms. Colon Flatterylgadi is a 49 year old female who presents with right knee pain and left heel pain. Onset of left heel pain was approximately one year ago without any known acute injury. Location of pain is primarily on the plantar foot in the region of the heel. She denies any bruising or swelling. She denies any numbness, tingling, or weakness. She was [facility] [place]. She states she has had 3 plantar fascia injections over the past year which only provided minimal relief. She also has tried a rigid plastic arch support in her shoe with little relief. She tried home stretching exercises and meloxicam as well. Symptoms are worse with the first few steps in the morning. Unfortunately, her new job requires her to wear rocker bottom shoes.  In addition, she notices right knee pain over the past 6 months without any known acute injury. Symptoms are worse with initially getting out of bed characterized as a stiffness and pain in the anterior right knee. She denies any swelling. She endorses catching, but denies giving way. Her pain does not wake her up at night.  Past medical history, social history, medications, and allergies were reviewed and are up to date in the chart.  Review of Systems 7 point review of systems was performed and was otherwise negative unless noted in the history of present illness.     Objective:   Physical Exam BP 158/94 mmHg  Pulse 71  Ht 4\' 8"  (1.422 m)  Wt 176 lb (79.833 kg)  BMI 39.48 kg/m2  LMP 03/19/2014 GEN: The patient is well-developed well-nourished female and in no acute distress.  She is awake alert and oriented x3. SKIN: warm and well-perfused, no rash  EXTR: No lower extremity edema or calf tenderness Neuro: Strength 5/5 globally. Sensation intact throughout. DTRs 2/4 bilaterally. No focal deficits. Vasc: +2 bilateral distal  pulses. No edema.  MSK: Examination of the right knee reveals range of motion from 0-115 with pain at the endpoint of motion. No effusion. No overlying erythema or induration. Positive medial joint line tenderness to palpation. Positive patellar grind test. No instability with valgus or varus stress testing. Examination of the left heel reveals area of tenderness to palpation over the proximal plantar fascia insertion. The plantar fascia is grossly intact. No swelling or bruising. Full ankle range of motion. She has a flattened medial longitudinal arch bilaterally, though worse on the left. She has hyperpronation of the bilateral ankles.  X-rays 09/12/14 Right Knee: Reviewed by me today. Mild medial compartment narrowing as well as patellofemoral spur.  Procedure:  Risks, benefits and complications were reviewed with the patient. After obtaining informed verbal consent the right knee was sterilely prepped with alcohol.  Ethyl chloride was used to anesthetize the skin and intra-articular injection using a 22-gauge by 1-1/2 inch needle was accomplished without difficulty.  We injected 80mg  methylprednisolone with 4 cc 1% lidocaine without epinephrine.  Patient tolerated the procedure well and was observed for 5-10 min. Post injection instructions, including signs and symptoms of complications were discussed.     Assessment & Plan:  Please see problem based assessment and plan in the problem list.

## 2015-04-17 NOTE — Assessment & Plan Note (Signed)
-  Green insole with small scaphoid pads added to insoles bilaterally -Start NTG protocol -Continue HEP, stretches, ice rolling -Try to avoid rocker bottom shoes -Plan follow-up in 6-8 weeks, consider custom orthotics if persists

## 2015-04-27 ENCOUNTER — Encounter: Payer: No Typology Code available for payment source | Admitting: Internal Medicine

## 2015-05-08 ENCOUNTER — Telehealth: Payer: Self-pay | Admitting: Internal Medicine

## 2015-05-08 NOTE — Telephone Encounter (Signed)
Forward to Dr Orpah CobbAdvani  New referral for [facility] same dx than before

## 2015-05-08 NOTE — Telephone Encounter (Signed)
Pt called requesting new referral back to sports and medicine orthopedics . Patient states she is having the same pain from before.  Please f/u with patient

## 2015-06-11 ENCOUNTER — Encounter: Payer: Self-pay | Admitting: Internal Medicine

## 2015-06-11 ENCOUNTER — Ambulatory Visit: Payer: Medicaid Other | Attending: Internal Medicine | Admitting: Internal Medicine

## 2015-06-11 VITALS — BP 131/94 | HR 89 | Temp 98.0°F | Resp 16 | Wt 170.0 lb

## 2015-06-11 DIAGNOSIS — Z1231 Encounter for screening mammogram for malignant neoplasm of breast: Secondary | ICD-10-CM | POA: Insufficient documentation

## 2015-06-11 DIAGNOSIS — M179 Osteoarthritis of knee, unspecified: Secondary | ICD-10-CM | POA: Diagnosis not present

## 2015-06-11 DIAGNOSIS — Z0000 Encounter for general adult medical examination without abnormal findings: Secondary | ICD-10-CM | POA: Diagnosis present

## 2015-06-11 DIAGNOSIS — E559 Vitamin D deficiency, unspecified: Secondary | ICD-10-CM | POA: Diagnosis not present

## 2015-06-11 DIAGNOSIS — H6122 Impacted cerumen, left ear: Secondary | ICD-10-CM | POA: Insufficient documentation

## 2015-06-11 DIAGNOSIS — Z1211 Encounter for screening for malignant neoplasm of colon: Secondary | ICD-10-CM | POA: Diagnosis not present

## 2015-06-11 DIAGNOSIS — M1711 Unilateral primary osteoarthritis, right knee: Secondary | ICD-10-CM

## 2015-06-11 LAB — COMPLETE METABOLIC PANEL WITH GFR
ALT: 23 U/L (ref 0–35)
AST: 20 U/L (ref 0–37)
Albumin: 4 g/dL (ref 3.5–5.2)
Alkaline Phosphatase: 104 U/L (ref 39–117)
BUN: 21 mg/dL (ref 6–23)
CALCIUM: 9.6 mg/dL (ref 8.4–10.5)
CHLORIDE: 102 meq/L (ref 96–112)
CO2: 27 meq/L (ref 19–32)
CREATININE: 0.72 mg/dL (ref 0.50–1.10)
GLUCOSE: 95 mg/dL (ref 70–99)
Potassium: 4 mEq/L (ref 3.5–5.3)
Sodium: 140 mEq/L (ref 135–145)
TOTAL PROTEIN: 6.9 g/dL (ref 6.0–8.3)
Total Bilirubin: 0.4 mg/dL (ref 0.2–1.2)

## 2015-06-11 LAB — LIPID PANEL
CHOLESTEROL: 221 mg/dL — AB (ref 0–200)
HDL: 61 mg/dL (ref >=46)
LDL Cholesterol: 143 mg/dL — ABNORMAL HIGH (ref 0–99)
Total CHOL/HDL Ratio: 3.6 Ratio
Triglycerides: 86 mg/dL (ref <150)
VLDL: 17 mg/dL (ref 0–40)

## 2015-06-11 LAB — HEMOGLOBIN A1C
HEMOGLOBIN A1C: 5.4 % (ref <5.7)
MEAN PLASMA GLUCOSE: 108 mg/dL (ref <117)

## 2015-06-11 MED ORDER — CARBAMIDE PEROXIDE 6.5 % OT SOLN: 5.0000 [drp] | Freq: Two times a day (BID) | OTIC | Status: DC

## 2015-06-11 NOTE — Progress Notes (Signed)
Patient Demographics  Catherine Patrick, is a 49 y.o. female  WUJ:[id number]  [id number]:[id number]  DOB - 07-Jul-1966  CC:  Chief Complaint  Patient presents with  . Annual Exam       HPI: Catherine Patrick is a 49 y.o. female here today for annual physical examination.patient has history of arthritis of right knee currently following up with sports medicine as per patient she had injections given in the past currently used the patch which helps her with her pain, she denies any acute symptoms. Denies any headache dizziness chest and shortness of breath. Patient has No headache, No chest pain, No abdominal pain - No Nausea, No new weakness tingling or numbness, No Cough - SOB.  No Known Allergies Past Medical History  Diagnosis Date  . Kidney stones    Current Outpatient Prescriptions on File Prior to Visit  Medication Sig Dispense Refill  . ibuprofen (ADVIL,MOTRIN) 600 MG tablet Take 1 tablet (600 mg total) by mouth every 6 (six) hours as needed (For pain.). 90 tablet 0  . meloxicam (MOBIC) 15 MG tablet Take 1 tablet (15 mg total) by mouth daily. 30 tablet 1  . nitroGLYCERIN (NITRODUR - DOSED IN MG/24 HR) 0.2 mg/hr patch Place 1/4 patch to affected area daily. 30 patch 1  . Vitamin D, Ergocalciferol, (DRISDOL) 50000 UNITS CAPS capsule Take 1 capsule (50,000 Units total) by mouth every 7 (seven) days. 12 capsule 0   No current facility-administered medications on file prior to visit.   History reviewed. No pertinent family history. History   Social History  . Marital Status: Married    Spouse Name: N/A  . Number of Children: N/A  . Years of Education: N/A   Occupational History  . Not on file.   Social History Main Topics  . Smoking status: Never Smoker   . Smokeless tobacco: Never Used  . Alcohol Use: No  . Drug Use: No  . Sexual Activity: Yes    Birth Control/ Protection: None   Other Topics Concern  . Not on file   Social History Narrative    Review of  Systems: Constitutional: Negative for fever, chills, diaphoresis, activity change, appetite change and fatigue. HENT: Negative for ear pain, nosebleeds, congestion, facial swelling, rhinorrhea, neck pain, neck stiffness and ear discharge.  Eyes: Negative for pain, discharge, redness, itching and visual disturbance. Respiratory: Negative for cough, choking, chest tightness, shortness of breath, wheezing and stridor.  Cardiovascular: Negative for chest pain, palpitations and leg swelling. Gastrointestinal: Negative for abdominal distention. Genitourinary: Negative for dysuria, urgency, frequency, hematuria, flank pain, decreased urine volume, difficulty urinating and dyspareunia.  Musculoskeletal: Negative for back pain, joint swelling, arthralgia and gait problem. Neurological: Negative for dizziness, tremors, seizures, syncope, facial asymmetry, speech difficulty, weakness, light-headedness, numbness and headaches.  Hematological: Negative for adenopathy. Does not bruise/bleed easily. Psychiatric/Behavioral: Negative for hallucinations, behavioral problems, confusion, dysphoric mood, decreased concentration and agitation.    Objective:   Filed Vitals:   06/11/15 1210  BP: 131/94  Pulse: 89  Temp: 98 F (36.7 C)  Resp: 16    Physical Exam: Constitutional: Patient appears well-developed and well-nourished. No distress. HENT: Normocephalic, atraumatic, External right and left ear normal. Oropharynx is clear and moist. Increased wax in  left ear Eyes: Conjunctivae and EOM are normal. PERRLA, no scleral icterus. Neck: Normal ROM. Neck supple. No JVD. No tracheal deviation. No thyromegaly. CVS: RRR, S1/S2 +, no murmurs, no gallops, no carotid bruit.  Pulmonary: Effort and breath sounds normal, no  stridor, rhonchi, wheezes, rales.  Abdominal: Soft. BS +, no distension, tenderness, rebound or guarding.  Musculoskeletal: Normal range of motion. No edema and no tenderness.  Neuro: Alert.  Normal reflexes, muscle tone coordination. No cranial nerve deficit. Skin: Skin is warm and dry. No rash noted. Not diaphoretic. No erythema. No pallor. Psychiatric: Normal mood and affect. Behavior, judgment, thought content normal.  Lab Results  Component Value Date   WBC 10.2 05/30/2013   HGB 13.4 05/30/2013   HCT 40.8 05/30/2013   MCV 89.5 05/30/2013   PLT 205 05/30/2013   Lab Results  Component Value Date   CREATININE 0.61 04/01/2015   BUN 19 04/01/2015   NA 139 04/01/2015   K 4.4 04/01/2015   CL 103 04/01/2015   CO2 27 04/01/2015    Lab Results  Component Value Date/Time   HGBA1C 5.8* 01/21/2013 07:11 PM   Lipid Panel  No results found for: CHOL, TRIG, HDL, CHOLHDL, VLDL, LDLCALC     Assessment and plan:   1. Annual physical exam  - COMPLETE METABOLIC PANEL WITH GFR - Hemoglobin A1c - Lipid panel  2. Vitamin D deficiency Patient is taking vitamin D prescription dose of 50,000 units once a week then she'll start taking OTC 2000 units daily  3. Osteoarthritis of right knee, unspecified osteoarthritis type Currently using ibuprofen as well as nitroglycerin patch  4. Encounter for screening mammogram for breast cancer  - MM DIGITAL SCREENING BILATERAL; Future  5. Special screening for malignant neoplasms, colon  - Ambulatory referral to Gastroenterology  6. Excess ear wax, left  - carbamide peroxide (DEBROX) 6.5 % otic solution; Place 5 drops into the left ear 2 (two) times daily.  Dispense: 15 mL; Refill: 1        Health Maintenance -Colonoscopy:referred to GI -Pap Smear:will be scheduled -Mammogram:ordered   Return in about 3 months (around 09/11/2015), or if symptoms worsen or fail to improve, for Schedule Appt with Dr Glendora ScoreFunches/ Valerie for PAP.    The patient was given clear instructions to go to ER or return to medical center if symptoms don't improve, worsen or new problems develop. The patient verbalized understanding. The patient was told  to call to get lab results if they haven't heard anything in the next week.    This note has been created with Education officer, environmentalDragon speech recognition software and smart phrase technology. Any transcriptional errors are unintentional.   Doris CheadleADVANI, Vickie Ponds, MD

## 2015-06-11 NOTE — Progress Notes (Signed)
Patient states she is her for her routine check up Patient has no complaints at this time

## 2015-06-15 ENCOUNTER — Ambulatory Visit (INDEPENDENT_AMBULATORY_CARE_PROVIDER_SITE_OTHER): Payer: Medicaid Other | Admitting: Family Medicine

## 2015-06-15 ENCOUNTER — Encounter: Payer: Self-pay | Admitting: Family Medicine

## 2015-06-15 VITALS — BP 125/72 | HR 75 | Ht <= 58 in | Wt 167.0 lb

## 2015-06-15 DIAGNOSIS — M722 Plantar fascial fibromatosis: Secondary | ICD-10-CM

## 2015-06-15 DIAGNOSIS — M1711 Unilateral primary osteoarthritis, right knee: Secondary | ICD-10-CM | POA: Diagnosis present

## 2015-06-15 NOTE — Progress Notes (Signed)
  Catherine Patrick - 49 y.o. female MRN [id number]  Date of birth: 01/14/1966  SUBJECTIVE:  Including CC & ROS.  CC: f/u on knee and foot pain  HPI: Patient returns for follow-up visit after diagnosis of plantar fascitis and OA of right knee. She states that her pain has improved with treatment rendered at last visit. She has been using her insoles in her shoes. Still endorses pain on left foot but improved. Patient has been using NTG on her bilateral knees for OA pain. She states this helps. Also continuing to use knee sleeve.    Review of Systems  Constitutional: Negative for fever and chills.  Respiratory: Negative for shortness of breath.   Cardiovascular: Positive for leg swelling. Negative for chest pain.  Gastrointestinal: Negative for nausea and vomiting.  Musculoskeletal: Positive for joint pain.  Neurological: Positive for headaches.   HISTORY: Past Medical, Surgical, Social, and Family History Reviewed & Updated per EMR.     PHYSICAL EXAM:  BP 125/72 mmHg  Pulse 75  Ht 4\' 8"  (1.422 m)  Wt 167 lb (75.751 kg)  BMI 37.46 kg/m2  LMP 03/19/2014  General: alert, well-developed, NAD, cooperative Msk: no joint swelling, no joint warmth, and no redness over joints. Examination of the left heel reveals area of tenderness to palpation over the proximal plantar fascia insertion.  Pulses: distal pulses intact Extremities: No edema or calf tenderness Neurologic: No focal deficits, +5 strength globally, sensation grossly intact Skin: Intact without suspicious lesions or rashes. Warm and dry.   ASSESSMENT & PLAN:   A: Plantar fasciitis of left foot - improving but still endorsing pain      OA of bilateral knees - chronic condition, stable with improvement in pain with NTG patch  P: -Continue NTG protocol  -Conservative treatment such as RICE and prn medications for pain  -Follow-up in 6-8 weeks   Caryl AdaJazma Lizvette Lightsey, DO 06/15/2015, 5:42 PM PGY-2, [facility]

## 2015-06-16 MED ORDER — NITROGLYCERIN 0.2 MG/HR TD PT24: MEDICATED_PATCH | TRANSDERMAL | Status: DC

## 2015-06-16 NOTE — Progress Notes (Signed)
Patient ID: Catherine Patrick, female   DOB: 01/17/1966, 49 y.o.   MRN: [id number] [facility] Attending Note: I have seen and examined this patient. I have discussed this patient with the resident and reviewed the assessment and plan as documented above. I agree with the resident's findings and plan. She has been using 1/4 of a notroglycerin patch on each KNEE--per the note from Dr. Joellyn HaffPick Jacobs it was intended to be used on her foot. IIt has however n\ben helpping her knees and her foot. Since she is better, I am not going to change teh current treatment. She IS having some headache still with the patch so we tlked about chaniging to night time dosing--alternatively we could decrease patch to 1/4 instead of 1/2. (total dose). F/u 1 m I erefilled her patches

## 2015-06-26 ENCOUNTER — Ambulatory Visit (HOSPITAL_COMMUNITY)
Admission: RE | Admit: 2015-06-26 | Discharge: 2015-06-26 | Disposition: A | Discharge status: Home / Self Care | Payer: Medicaid Other | Source: Ambulatory Visit | Attending: Internal Medicine | Admitting: Internal Medicine

## 2015-06-26 DIAGNOSIS — Z1231 Encounter for screening mammogram for malignant neoplasm of breast: Secondary | ICD-10-CM | POA: Insufficient documentation

## 2015-06-30 ENCOUNTER — Other Ambulatory Visit: Payer: Self-pay | Admitting: Internal Medicine

## 2015-06-30 DIAGNOSIS — R928 Other abnormal and inconclusive findings on diagnostic imaging of breast: Secondary | ICD-10-CM

## 2015-07-09 ENCOUNTER — Ambulatory Visit
Admission: RE | Admit: 2015-07-09 | Discharge: 2015-07-09 | Disposition: A | Discharge status: Home / Self Care | Payer: Medicaid Other | Source: Ambulatory Visit | Attending: Internal Medicine | Admitting: Internal Medicine

## 2015-07-09 ENCOUNTER — Other Ambulatory Visit: Payer: Self-pay | Admitting: Internal Medicine

## 2015-07-09 DIAGNOSIS — R921 Mammographic calcification found on diagnostic imaging of breast: Secondary | ICD-10-CM

## 2015-07-09 DIAGNOSIS — R928 Other abnormal and inconclusive findings on diagnostic imaging of breast: Secondary | ICD-10-CM

## 2015-07-10 ENCOUNTER — Ambulatory Visit
Admission: RE | Admit: 2015-07-10 | Discharge: 2015-07-10 | Disposition: A | Discharge status: Home / Self Care | Payer: Medicaid Other | Source: Ambulatory Visit | Attending: Internal Medicine | Admitting: Internal Medicine

## 2015-07-10 ENCOUNTER — Other Ambulatory Visit: Payer: Self-pay | Admitting: Internal Medicine

## 2015-07-10 DIAGNOSIS — R921 Mammographic calcification found on diagnostic imaging of breast: Secondary | ICD-10-CM

## 2015-07-15 ENCOUNTER — Telehealth: Payer: Self-pay

## 2015-07-15 NOTE — Telephone Encounter (Signed)
-----   Message from Dessa Phi, MD sent at 07/13/2015  6:37 PM EDT ----- Benign L breast biopsy

## 2015-07-15 NOTE — Telephone Encounter (Signed)
Patient is aware of her biopsy results

## 2015-07-31 ENCOUNTER — Ambulatory Visit: Payer: Medicaid Other | Attending: Family Medicine | Admitting: Family Medicine

## 2015-07-31 ENCOUNTER — Encounter: Payer: Self-pay | Admitting: Family Medicine

## 2015-07-31 ENCOUNTER — Other Ambulatory Visit (HOSPITAL_COMMUNITY)
Admission: RE | Admit: 2015-07-31 | Discharge: 2015-07-31 | Disposition: A | Discharge status: Home / Self Care | Payer: Medicaid Other | Source: Ambulatory Visit | Attending: Family Medicine | Admitting: Family Medicine

## 2015-07-31 VITALS — BP 117/81 | HR 96 | Temp 98.7°F | Resp 16 | Ht <= 58 in | Wt 169.0 lb

## 2015-07-31 DIAGNOSIS — N644 Mastodynia: Secondary | ICD-10-CM | POA: Diagnosis not present

## 2015-07-31 DIAGNOSIS — Z1151 Encounter for screening for human papillomavirus (HPV): Secondary | ICD-10-CM | POA: Insufficient documentation

## 2015-07-31 DIAGNOSIS — Z124 Encounter for screening for malignant neoplasm of cervix: Secondary | ICD-10-CM | POA: Insufficient documentation

## 2015-07-31 DIAGNOSIS — N760 Acute vaginitis: Secondary | ICD-10-CM | POA: Insufficient documentation

## 2015-07-31 DIAGNOSIS — Z01419 Encounter for gynecological examination (general) (routine) without abnormal findings: Secondary | ICD-10-CM | POA: Diagnosis present

## 2015-07-31 DIAGNOSIS — Z113 Encounter for screening for infections with a predominantly sexual mode of transmission: Secondary | ICD-10-CM | POA: Diagnosis present

## 2015-07-31 NOTE — Assessment & Plan Note (Signed)
Pap done today  

## 2015-07-31 NOTE — Progress Notes (Signed)
Establish Care with new PCP Pap smear today, last smear 2012 with normal results  Complaining of lt breast pain after Bx on 07/10/2015 Pain Scale #5

## 2015-07-31 NOTE — Patient Instructions (Signed)
Catherine Patrick,  Thank you for coming in today. It was a pleasure meeting you. I look forward to being your primary doctor.  1. Pap done today  2. L breast pain following biopsy: Allow up to 8 weeks for breast to heal   F/u in 2-3 weeks for flu shot with nurse F/u with me in 6-8 weeks to check L breast   Dr. Armen Pickup

## 2015-07-31 NOTE — Assessment & Plan Note (Signed)
A: L breast pain at biopsy site. Induration consistent with routine healing at site P: Reassurance F/u check in 6-8 weeks

## 2015-07-31 NOTE — Progress Notes (Signed)
   Subjective:    Patient ID: Catherine Patrick, female    DOB: [birth year], 49 y.o.   MRN: [id number] CC: pap and breast exam  HPI 49 yo F presents for f/u  1. Pap: no hx of abnormal pap. Last pap 2012. No vaginal bleeding or discharge. No pelvic pain.   2. L breast: had a biopsy done on 07/10/2015. bx was benign. Patient having some pain at biopsy site. Has metallic marker still in place with plan for f/u mamm0gram in 6 months.   Social History  Substance Use Topics  . Smoking status: Never Smoker   . Smokeless tobacco: Never Used  . Alcohol Use: No   Review of Systems  Constitutional: Negative for fever and chills.  Genitourinary: Negative for vaginal bleeding, vaginal discharge, vaginal pain and menstrual problem.      Objective:   Physical Exam  Constitutional: She appears well-developed and well-nourished. No distress.  Cardiovascular: Intact distal pulses.   Pulmonary/Chest: Effort normal.    Abdominal: Soft. She exhibits no distension. There is no tenderness.  Genitourinary: Vagina normal and uterus normal. Pelvic exam was performed with patient prone. There is no rash, tenderness or lesion on the right labia. There is no rash, tenderness or lesion on the left labia. Cervix exhibits no motion tenderness, no discharge and no friability.    Musculoskeletal: She exhibits no edema.  Lymphadenopathy:       Right: No inguinal adenopathy present.       Left: No inguinal adenopathy present.  Skin: Skin is warm and dry. No rash noted.  BP 117/81 mmHg  Pulse 96  Temp(Src) 98.7 F (37.1 C) (Oral)  Resp 16  Ht 4' 9.6" (1.463 m)  Wt 169 lb (76.658 kg)  BMI 35.82 kg/m2  SpO2 99%  LMP 03/19/2014        Assessment & Plan:

## 2015-08-04 ENCOUNTER — Telehealth: Payer: Self-pay

## 2015-08-04 LAB — CYTOLOGY - PAP

## 2015-08-04 LAB — CERVICOVAGINAL ANCILLARY ONLY
CHLAMYDIA, DNA PROBE: NEGATIVE
NEISSERIA GONORRHEA: NEGATIVE
Wet Prep (BD Affirm): NEGATIVE

## 2015-08-04 NOTE — Telephone Encounter (Signed)
Patient not available Left message on voice mail to return our call 

## 2015-08-05 NOTE — Telephone Encounter (Signed)
-----   Message from Dessa Phi, MD sent at 08/04/2015  2:50 PM EDT ----- Negative pap Repeat in 3 years

## 2015-08-05 NOTE — Telephone Encounter (Signed)
LVM to return call.

## 2015-08-05 NOTE — Telephone Encounter (Signed)
-----   Message from Dessa Phi, MD sent at 08/04/2015 11:59 AM EDT ----- GC/chlam negative

## 2015-08-05 NOTE — Telephone Encounter (Signed)
Patient returned nurses phone call.  A good time to call is between 1 and 2. Please follow up.

## 2015-08-05 NOTE — Telephone Encounter (Signed)
-----   Message from Dessa Phi, MD sent at 08/04/2015  8:43 AM EDT ----- Negative wet prep

## 2015-08-10 ENCOUNTER — Emergency Department (HOSPITAL_COMMUNITY)
Admission: EM | Admit: 2015-08-10 | Discharge: 2015-08-10 | Disposition: A | Discharge status: Home / Self Care | Payer: Medicaid Other | Attending: Emergency Medicine | Admitting: Emergency Medicine

## 2015-08-10 ENCOUNTER — Encounter (HOSPITAL_COMMUNITY): Payer: Self-pay | Admitting: Emergency Medicine

## 2015-08-10 DIAGNOSIS — R1030 Lower abdominal pain, unspecified: Secondary | ICD-10-CM | POA: Diagnosis present

## 2015-08-10 DIAGNOSIS — Z9889 Other specified postprocedural states: Secondary | ICD-10-CM | POA: Insufficient documentation

## 2015-08-10 DIAGNOSIS — Z4889 Encounter for other specified surgical aftercare: Secondary | ICD-10-CM | POA: Insufficient documentation

## 2015-08-10 DIAGNOSIS — Z79899 Other long term (current) drug therapy: Secondary | ICD-10-CM | POA: Insufficient documentation

## 2015-08-10 DIAGNOSIS — Z5189 Encounter for other specified aftercare: Secondary | ICD-10-CM

## 2015-08-10 DIAGNOSIS — Z87442 Personal history of urinary calculi: Secondary | ICD-10-CM | POA: Insufficient documentation

## 2015-08-10 LAB — COMPREHENSIVE METABOLIC PANEL
ALK PHOS: 121 U/L (ref 38–126)
ALT: 20 U/L (ref 14–54)
AST: 23 U/L (ref 15–41)
Albumin: 3.7 g/dL (ref 3.5–5.0)
Anion gap: 8 (ref 5–15)
BUN: 16 mg/dL (ref 6–20)
CALCIUM: 9.2 mg/dL (ref 8.9–10.3)
CO2: 28 mmol/L (ref 22–32)
CREATININE: 0.71 mg/dL (ref 0.44–1.00)
Chloride: 104 mmol/L (ref 101–111)
Glucose, Bld: 94 mg/dL (ref 65–99)
Potassium: 3.7 mmol/L (ref 3.5–5.1)
Sodium: 140 mmol/L (ref 135–145)
TOTAL PROTEIN: 7.2 g/dL (ref 6.5–8.1)
Total Bilirubin: 0.6 mg/dL (ref 0.3–1.2)

## 2015-08-10 LAB — CBC
HCT: 43.6 % (ref 36.0–46.0)
Hemoglobin: 14.1 g/dL (ref 12.0–15.0)
MCH: 29.7 pg (ref 26.0–34.0)
MCHC: 32.3 g/dL (ref 30.0–36.0)
MCV: 91.8 fL (ref 78.0–100.0)
PLATELETS: 211 10*3/uL (ref 150–400)
RBC: 4.75 MIL/uL (ref 3.87–5.11)
RDW: 14.2 % (ref 11.5–15.5)
WBC: 8.7 10*3/uL (ref 4.0–10.5)

## 2015-08-10 LAB — URINALYSIS, ROUTINE W REFLEX MICROSCOPIC
Bilirubin Urine: NEGATIVE
GLUCOSE, UA: NEGATIVE mg/dL
Hgb urine dipstick: NEGATIVE
KETONES UR: NEGATIVE mg/dL
LEUKOCYTES UA: NEGATIVE
NITRITE: NEGATIVE
PROTEIN: NEGATIVE mg/dL
Specific Gravity, Urine: 1.008 (ref 1.005–1.030)
UROBILINOGEN UA: 0.2 mg/dL (ref 0.0–1.0)
pH: 6.5 (ref 5.0–8.0)

## 2015-08-10 LAB — LIPASE, BLOOD: Lipase: 31 U/L (ref 22–51)

## 2015-08-10 NOTE — Discharge Instructions (Signed)
The left breast wound should improve with treatments of warm compresses 3 or 4 times a day, until the wound is closed over. The abdominal pain, is nonspecific. For the pain, take Tylenol every 4 hours if needed. See your Dr. for a checkup in one week.   Abdominal Pain Many things can cause abdominal pain. Usually, abdominal pain is not caused by a disease and will improve without treatment. It can often be observed and treated at home. Your health care provider will do a physical exam and possibly order blood tests and X-rays to help determine the seriousness of your pain. However, in many cases, more time must pass before a clear cause of the pain can be found. Before that point, your health care provider may not know if you need more testing or further treatment. HOME CARE INSTRUCTIONS  Monitor your abdominal pain for any changes. The following actions may help to alleviate any discomfort you are experiencing:  Only take over-the-counter or prescription medicines as directed by your health care provider.  Do not take laxatives unless directed to do so by your health care provider.  Try a clear liquid diet (broth, tea, or water) as directed by your health care provider. Slowly move to a bland diet as tolerated. SEEK MEDICAL CARE IF:  You have unexplained abdominal pain.  You have abdominal pain associated with nausea or diarrhea.  You have pain when you urinate or have a bowel movement.  You experience abdominal pain that wakes you in the night.  You have abdominal pain that is worsened or improved by eating food.  You have abdominal pain that is worsened with eating fatty foods.  You have a fever. SEEK IMMEDIATE MEDICAL CARE IF:   Your pain does not go away within 2 hours.  You keep throwing up (vomiting).  Your pain is felt only in portions of the abdomen, such as the right side or the left lower portion of the abdomen.  You pass bloody or black tarry stools. MAKE SURE  YOU:  Understand these instructions.   Will watch your condition.   Will get help right away if you are not doing well or get worse.  Document Released: 08/31/2005 Document Revised: 11/26/2013 Document Reviewed: 07/31/2013 [facility] Patient Information 2015 [place], [place]. This information is not intended to replace advice given to you by your health care provider. Make sure you discuss any questions you have with your health care provider.

## 2015-08-10 NOTE — ED Notes (Signed)
Pt alert x4 respirations easy non labored.  

## 2015-08-10 NOTE — ED Provider Notes (Signed)
CSN: [id number]     Arrival date & time 08/10/15  1308 History   First MD Initiated Contact with Patient 08/10/15 [phone]     Chief Complaint  Patient presents with  . Abdominal Pain     (Consider location/radiation/quality/duration/timing/severity/associated sxs/prior Treatment) HPI   Catherine Patrick is a 49 y.o. female who presents for evaluation of infected wound, from breast biopsy, one month ago. She had not remove the bandage until this morning, and when she did she thought it looked infected. It is draining somewhat. She denies fever, chills, nausea, vomiting, weakness or dizziness. She had an episode of lower abdominal pain yesterday and today, both of which resolved spontaneously. She denies dysuria, or constipation. There are no other known modifying factors.   Past Medical History  Diagnosis Date  . Kidney stones    Past Surgical History  Procedure Laterality Date  . Hernia repair    . Cesarean section     History reviewed. No pertinent family history. Social History  Substance Use Topics  . Smoking status: Never Smoker   . Smokeless tobacco: Never Used  . Alcohol Use: No   OB History    Gravida Para Term Preterm AB TAB SAB Ectopic Multiple Living            3     Review of Systems  All other systems reviewed and are negative.     Allergies  Review of patient's allergies indicates no known allergies.  Home Medications   Prior to Admission medications   Medication Sig Start Date End Date Taking? Authorizing Provider  Cholecalciferol (VITAMIN D PO) Take 1 capsule by mouth daily.   Yes Historical Provider, MD  ibuprofen (ADVIL,MOTRIN) 600 MG tablet Take 1 tablet (600 mg total) by mouth every 6 (six) hours as needed (For pain.). 04/01/15  Yes Doris Cheadle, MD  nitroGLYCERIN (NITRODUR - DOSED IN MG/24 HR) 0.2 mg/hr patch Place 1/4 patch to affected area daily. 06/16/15  Yes Nestor Ramp, MD  Vitamin D, Ergocalciferol, (DRISDOL) 50000 UNITS CAPS capsule Take 1 capsule  (50,000 Units total) by mouth every 7 (seven) days. Patient not taking: Reported on 08/10/2015 04/02/15   Doris Cheadle, MD   BP 145/84 mmHg  Pulse 75  Temp(Src) 98.4 F (36.9 C) (Oral)  Resp 18  SpO2 98%  LMP 03/19/2014 Physical Exam  Constitutional: She is oriented to person, place, and time. She appears well-developed and well-nourished.  HENT:  Head: Normocephalic and atraumatic.  Right Ear: External ear normal.  Left Ear: External ear normal.  Eyes: Conjunctivae and EOM are normal. Pupils are equal, round, and reactive to light.  Neck: Normal range of motion and phonation normal. Neck supple.  Cardiovascular: Normal rate, regular rhythm and normal heart sounds.   Pulmonary/Chest: Effort normal and breath sounds normal. She exhibits no bony tenderness.  Left breast wound, at 8:00, position, with Steri-Strips over wound opening, and 1 cm area of granulation tissue without associated drainage or fluctuance. The left breast is nontender to palpation.  Abdominal: Soft. There is no tenderness.  Musculoskeletal: Normal range of motion.  Neurological: She is alert and oriented to person, place, and time. No cranial nerve deficit or sensory deficit. She exhibits normal muscle tone. Coordination normal.  Skin: Skin is warm, dry and intact.  Psychiatric: She has a normal mood and affect. Her behavior is normal. Judgment and thought content normal.  Nursing note and vitals reviewed.   ED Course  Procedures (including critical care time)  Medications -  No data to display  Patient Vitals for the past 24 hrs:  BP Temp Temp src Pulse Resp SpO2  08/10/15 1125 - - - 75 - 98 %  08/10/15 1124 145/84 mmHg - - - - -  08/10/15 0909 157/96 mmHg 98.4 F (36.9 C) Oral 88 18 100 %    At discharge- Reevaluation with update and discussion. After initial assessment and treatment, an updated evaluation reveals patient is comfortable. Findings discussed with patient, all questions answered. Catherine Patrick  L     Labs Review Labs Reviewed  URINALYSIS, ROUTINE W REFLEX MICROSCOPIC (NOT AT [facility]) - Abnormal; Notable for the following:    Color, Urine STRAW (*)    All other components within normal limits  LIPASE, BLOOD  COMPREHENSIVE METABOLIC PANEL  CBC    Imaging Review No results found. I have personally reviewed and evaluated these images and lab results as part of my medical decision-making.   EKG Interpretation None      MDM   Final diagnoses:  Visit for wound check  Lower abdominal pain    Left breast biopsy wound, is healing without infection. Nonspecific abdominal pain, without evidence for bacterial infection, metabolic instability or suggestion for impending vascular collapse.  Nursing Notes Reviewed/ Care Coordinated Applicable Imaging Reviewed Interpretation of Laboratory Data incorporated into ED treatment  The patient appears reasonably screened and/or stabilized for discharge and I doubt any other medical condition or other [facility] requiring further screening, evaluation, or treatment in the ED at this time prior to discharge.  Plan: Home Medications- usual; Home Treatments- wound care, rest; return here if the recommended treatment, does not improve the symptoms; Recommended follow up- PCP prn   Mancel Bale, MD 08/10/15 [phone]

## 2015-08-10 NOTE — ED Notes (Signed)
Pt c/o low medial abdominal pain onset yesterday, states "I believe I ate something bad." Denies nausea, emesis or diarrhea. Denies urinary symptoms.

## 2015-08-19 ENCOUNTER — Encounter: Payer: Self-pay | Admitting: Family Medicine

## 2015-08-19 ENCOUNTER — Ambulatory Visit: Payer: Medicaid Other | Attending: Family Medicine | Admitting: Family Medicine

## 2015-08-19 VITALS — BP 119/86 | HR 85 | Temp 98.5°F | Resp 16 | Ht <= 58 in | Wt 168.0 lb

## 2015-08-19 DIAGNOSIS — S21002A Unspecified open wound of left breast, initial encounter: Secondary | ICD-10-CM | POA: Insufficient documentation

## 2015-08-19 DIAGNOSIS — S21002D Unspecified open wound of left breast, subsequent encounter: Secondary | ICD-10-CM | POA: Insufficient documentation

## 2015-08-19 MED ORDER — DOXYCYCLINE HYCLATE 100 MG PO TABS
100.0000 mg | ORAL_TABLET | Freq: Two times a day (BID) | ORAL | Status: DC
Start: 2015-08-19 — End: 2016-07-11

## 2015-08-19 NOTE — Patient Instructions (Addendum)
Ms. Igo,  Thank you for coming in today  1. Delayed healing of L breast biopsy site 10 day course of doxycycline to promote healing and prevent infection  F/u with me in 3-4 week for wound check  Schedule flu shot during one of our flu shot clinic.   Dr. Armen Pickup

## 2015-08-19 NOTE — Telephone Encounter (Signed)
Given pt result at OV

## 2015-08-19 NOTE — Progress Notes (Signed)
Patient ID: Catherine Patrick, female   DOB: Jun 18, 1966, 49 y.o.   MRN: [id number]   Subjective:  Patient ID: Catherine Patrick, female    DOB: 06/13/1966  Age: 49 y.o. MRN: [id number]  CC: Breast Problem  HPI Catherine Patrick presents for L breast pain following L lower inner quadrant breast biopsy on 07/10/2015. The biopsy revealed benign breast tissue. Patient has a X shaped metallic tissue marker clip at the site of the breast biopsy. Patient is having intermittent drainage of yellow and bloody discharge. No discharge now. She has tenderness on palpation of the breast. She went to ED a few day ago.    Outpatient Prescriptions Prior to Visit  Medication Sig Dispense Refill  . Cholecalciferol (VITAMIN D PO) Take 1 capsule by mouth daily.    . nitroGLYCERIN (NITRODUR - DOSED IN MG/24 HR) 0.2 mg/hr patch Place 1/4 patch to affected area daily. 30 patch 1  . Vitamin D, Ergocalciferol, (DRISDOL) 50000 UNITS CAPS capsule Take 1 capsule (50,000 Units total) by mouth every 7 (seven) days. 12 capsule 0  . ibuprofen (ADVIL,MOTRIN) 600 MG tablet Take 1 tablet (600 mg total) by mouth every 6 (six) hours as needed (For pain.). 90 tablet 0   No facility-administered medications prior to visit.   Social History  Substance Use Topics  . Smoking status: Never Smoker   . Smokeless tobacco: Never Used  . Alcohol Use: No   ROS Review of Systems  Constitutional: Negative for fever and chills.  Skin: Positive for wound.    Objective:  BP 119/86 mmHg  Pulse 85  Temp(Src) 98.5 F (36.9 C) (Oral)  Resp 16  Ht  (1.422 m)  Wt 168 lb (76.204 kg)  BMI 37.69 kg/m2  SpO2 98%  LMP 03/19/2014  BP/Weight 08/19/2015 08/10/2015 07/31/2015  Systolic BP 119 145 117  Diastolic BP 86 84 81  Wt. (Lbs) 168 - 169  BMI 37.69 - 35.82     Physical Exam  Pulmonary/Chest:    Skin: Skin is warm and dry.     Assessment & Plan:   Problem List Items Addressed This Visit    Wound of left breast - Primary   Relevant  Medications   doxycycline (VIBRA-TABS) 100 MG tablet      No orders of the defined types were placed in this encounter.    Follow-up: Return in about 4 weeks (around 09/16/2015).   Dessa Phi MD

## 2015-08-19 NOTE — Progress Notes (Signed)
C/C possible infection on lt breast post Bx No pain, pain with pressure

## 2015-08-25 ENCOUNTER — Ambulatory Visit: Payer: Medicaid Other | Admitting: Pharmacist

## 2015-09-29 ENCOUNTER — Ambulatory Visit (INDEPENDENT_AMBULATORY_CARE_PROVIDER_SITE_OTHER): Payer: Medicaid Other | Admitting: Family Medicine

## 2015-09-29 ENCOUNTER — Encounter: Payer: Self-pay | Admitting: Family Medicine

## 2015-09-29 ENCOUNTER — Ambulatory Visit
Admission: RE | Admit: 2015-09-29 | Discharge: 2015-09-29 | Disposition: A | Discharge status: Home / Self Care | Payer: Medicaid Other | Source: Ambulatory Visit | Attending: Family Medicine | Admitting: Family Medicine

## 2015-09-29 VITALS — BP 131/87 | Ht <= 58 in | Wt 168.0 lb

## 2015-09-29 DIAGNOSIS — M170 Bilateral primary osteoarthritis of knee: Secondary | ICD-10-CM

## 2015-09-29 DIAGNOSIS — M25561 Pain in right knee: Secondary | ICD-10-CM | POA: Diagnosis not present

## 2015-09-29 DIAGNOSIS — M722 Plantar fascial fibromatosis: Secondary | ICD-10-CM | POA: Diagnosis not present

## 2015-09-29 DIAGNOSIS — M25562 Pain in left knee: Secondary | ICD-10-CM

## 2015-09-29 DIAGNOSIS — M25572 Pain in left ankle and joints of left foot: Secondary | ICD-10-CM

## 2015-09-29 MED ORDER — NITROGLYCERIN 0.2 MG/HR TD PT24: MEDICATED_PATCH | TRANSDERMAL | Status: DC

## 2015-09-29 MED ORDER — METHYLPREDNISOLONE ACETATE 40 MG/ML IJ SUSP
40.0000 mg | Freq: Once | INTRAMUSCULAR | Status: AC
  Administered 2015-09-29: 40 mg via INTRA_ARTICULAR

## 2015-09-29 MED ORDER — METHYLPREDNISOLONE ACETATE 40 MG/ML IJ SUSP
40.0000 mg | Freq: Once | INTRAMUSCULAR | Status: AC
Start: 2015-09-29 — End: 2015-09-29
  Administered 2015-09-29: 40 mg via INTRA_ARTICULAR

## 2015-09-29 MED ORDER — MELOXICAM 15 MG PO TABS: ORAL_TABLET | ORAL | Status: DC

## 2015-09-29 NOTE — Progress Notes (Signed)
Catherine Patrick - 49 y.o. female MRN [id number]  Date of birth: 11/09/1966  CC: b/l knee and left ankle pain.   SUBJECTIVE:   HPI  B/l knee pain: Left knee pain is new.  - Ran out of NTG patches which she had been using for her knees 3 days ago. No side effects.  - She feels as though the NTG patches are helping her knees.  - Pain began worsening 1 week ago.   - Has a knee sleeve that she stopped wearing 3 weeks ago, because it was too hot.  - Occasionally IBU for the pain.  - Previously had right knee pain.  - No significant swelling.  No locking or catching.  - Worst when going down stairs.   - Last knee injection in 04/2015. This helped up until 1-2 months ago.    Left heel and lateral ankle pain:  - Began hurting 2 days ago.   - She is no longer using her green insoles with the scaphoid pads as the green insoles have flattened out.  Insoles did help initially.  - She previously had 3 plantar fascia injections prior to being seen here 5 months ago.  - Now having left lateral ankle pain and swelling.    ROS:     10 point RoS negative other than that listed in HPI  HISTORY: Past Medical, Surgical, Social, and Family History Reviewed & Updated per EMR.    OBJECTIVE: Ht 4\' 8"  (1.422 m)  Wt 168 lb (76.204 kg)  BMI 37.69 kg/m2  LMP 03/19/2014  Physical Exam  GEN: The patient is well-developed well-nourished female and in no acute distress. She is awake alert and oriented x3. SKIN: warm and well-perfused, no rash  EXTR: No lower extremity edema or calf tenderness Neuro: Strength 5/5 globally. Sensation intact throughout. DTRs 2/4 bilaterally. No focal deficits. Vasc: +2 bilateral distal pulses. No edema.  MSK:  B/l knee exam: Identical exam b/l. Examination of the b/l knee reveals range of motion from 0-115 with pain at the endpoint of motion (most notably at full flexion. No effusion. No overlying erythema or induration. Positive medial joint line tenderness to palpation. Positive  patellar grind test & compression. No instability with valgus or varus stress testing. Ankle: left Sinus tarsi swelling Range of motion is full in all directions. Strength is 5/5 in all directions. Minimal pain with eversion Stable lateral and medial ligaments; squeeze test and kleiger test unremarkable; Vague lateral ankle pain although mostly over the base of the 5th MT. No sign of peroneal tendon subluxations.  Foot exam: Examination of the left heel reveals area of tenderness to palpation over the proximal plantar fascia insertion. The plantar fascia is grossly intact. No swelling or bruising. Full ankle range of motion. She has a flattened medial longitudinal arch bilaterally, though worse on the left. She has calcaneus valgus.   Previous XR of the right knee was again reviewed today: mild medial compartment narrowing with a PF spur.  MEDICATIONS, LABS & OTHER ORDERS: Previous Medications   CHOLECALCIFEROL (VITAMIN D PO)    Take 1 capsule by mouth daily.   DOXYCYCLINE (VIBRA-TABS) 100 MG TABLET    Take 1 tablet (100 mg total) by mouth 2 (two) times daily.   IBUPROFEN (ADVIL,MOTRIN) 600 MG TABLET    Take 1 tablet (600 mg total) by mouth every 6 (six) hours as needed (For pain.).   NITROGLYCERIN (NITRODUR - DOSED IN MG/24 HR) 0.2 MG/HR PATCH    Place 1/4 patch  to affected area daily.   VITAMIN D, ERGOCALCIFEROL, (DRISDOL) 50000 UNITS CAPS CAPSULE    Take 1 capsule (50,000 Units total) by mouth every 7 (seven) days.   Modified Medications   No medications on file   New Prescriptions   No medications on file   Discontinued Medications   No medications on file  No orders of the defined types were placed in this encounter.   ASSESSMENT & PLAN: See problem based charting & AVS for pt instructions.  Consent obtained and verified. Sterile betadine prep. Furthur cleansed with alcohol. Topical analgesic spray: Ethyl chloride. Joint: Right knee Approached in typical fashion with:  Anteriolateral Completed without difficulty Meds: 2cc methylprednisolone ( ) & 4 cc of 1% xylocaine Needle: 25g x 1.5' Aftercare instructions and Red flags advised.  Consent obtained and verified. Sterile betadine prep. Furthur cleansed with alcohol. Topical analgesic spray: Ethyl chloride. Joint: Left knee Approached in typical fashion with: Anteriolateral Completed without difficulty Meds: 2cc methylprednisolone ( ) & 4 cc of 1% xylocaine Needle: 25g x 1.5' Aftercare instructions and Red flags advised.

## 2015-09-30 DIAGNOSIS — M1711 Unilateral primary osteoarthritis, right knee: Secondary | ICD-10-CM | POA: Insufficient documentation

## 2015-09-30 DIAGNOSIS — M25572 Pain in left ankle and joints of left foot: Secondary | ICD-10-CM | POA: Insufficient documentation

## 2015-09-30 NOTE — Assessment & Plan Note (Signed)
Previously diagnosed with right OA. Now developing symptoms on the left.  More PF based on symptoms - Will start daily mobic 15mg  for 2 weeks.  Then PRN.  - Continue with knee sleeve.  - No XR of left. Will get b/l knee films.  - both knees injected today. Given return precautions - f/u PRN>

## 2015-09-30 NOTE — Assessment & Plan Note (Signed)
Slightly aggravated. Not wearing insoles.  Continue with HEP, stretching and ice.  Provided with replacement pair of insoles.   - F/u in 4 weeks for orthotics.

## 2015-10-05 ENCOUNTER — Telehealth: Payer: Self-pay | Admitting: Family Medicine

## 2015-10-06 NOTE — Telephone Encounter (Signed)
Could not reach patient °

## 2015-10-13 ENCOUNTER — Ambulatory Visit (INDEPENDENT_AMBULATORY_CARE_PROVIDER_SITE_OTHER): Payer: Medicaid Other | Admitting: Family Medicine

## 2015-10-13 ENCOUNTER — Encounter: Payer: Self-pay | Admitting: Family Medicine

## 2015-10-13 VITALS — BP 131/86 | HR 76 | Ht <= 58 in | Wt 168.0 lb

## 2015-10-13 DIAGNOSIS — M722 Plantar fascial fibromatosis: Secondary | ICD-10-CM | POA: Diagnosis not present

## 2015-10-13 NOTE — Progress Notes (Signed)
Catherine AlimentSulwa Patrick - 49 y.o. female MRN [id number]  Date of birth: 10/20/1966  CC: Left achilles   SUBJECTIVE:   HPI  Last seen on 09/29/2015 for b/l knee OA & left plantar fasciitis Left plantar fasciitis:  - Recurrent issue. - better than at last visit while wearing scaphoid pads with gel inserts.  - Still wearing flexible soled shoes. Waiting to buy new shoes.  - She has previously had 3 plantar fascia injections prior to being seen here 6 months ago.  - She was also given an left ankle brace which she has trouble fitting into her shoe.  - She does not remember what exercises she should be doing.   - Again notes pain with 1st steps in the AM and after just getting up from a chair.   F/u b/l knee pain:  - Currently having no knee pain following b/l corticosteroid injection for OA last week.  - Taking mobic 15mg  daily for last 2 weeks.  - B/l knee braces.   - Not doing any exercises.  - No swelling, locking, or catching.    ROS:     14 point RoS negative other than that listed in HPI above.   HISTORY: Past Medical, Surgical, Social, and Family History Reviewed & Updated per EMR.    OBJECTIVE: BP 131/86 mmHg  Pulse 76  Ht 4\' 8"  (1.422 m)  Wt 168 lb (76.204 kg)  BMI 37.69 kg/m2  LMP 03/19/2014  Physical Exam  GEN: The patient is well-developed well-nourished female and in no acute distress. She is awake alert and oriented x3. SKIN: warm and well-perfused, no rash  EXTR: No lower extremity edema or calf tenderness Neuro: Strength 5/5 globally. Sensation intact throughout. DTRs 2/4 bilaterally. No focal deficits. Vasc: +2 bilateral distal pulses. No edema.   MSK:  B/l knee exam: Identical exam b/l. Examination of the b/l knee reveals range of motion from 0-115 with pain at the endpoint of motion (most notably at full flexion. No effusion. No overlying erythema or induration. Positive medial joint line tenderness to palpation. Now equivocal  patellar grind test & compression. No  instability with valgus or varus stress testing. Ankle: left Sinus tarsi swelling Range of motion is full in all directions. Strength is 5/5 in all directions. Minimal pain with eversion Foot exam: Examination of the left heel reveals area of tenderness to palpation over the calcaneal plantar fascia insertion. The plantar fascia is grossly intact. No swelling or bruising. Full ankle range of motion. She has a flattened medial longitudinal arch bilaterally, though worse on the left. She has calcaneus valgus.   MEDICATIONS, LABS & OTHER ORDERS: Previous Medications   CHOLECALCIFEROL (VITAMIN D PO)    Take 1 capsule by mouth daily.   DOXYCYCLINE (VIBRA-TABS) 100 MG TABLET    Take 1 tablet (100 mg total) by mouth 2 (two) times daily.   IBUPROFEN (ADVIL,MOTRIN) 600 MG TABLET    Take 1 tablet (600 mg total) by mouth every 6 (six) hours as needed (For pain.).   MELOXICAM (MOBIC) 15 MG TABLET    Take one tablet daily as needed for pain   NITROGLYCERIN (NITRODUR - DOSED IN MG/24 HR) 0.2 MG/HR PATCH    Place 1/4 patch to affected area daily.   VITAMIN D, ERGOCALCIFEROL, (DRISDOL) 50000 UNITS CAPS CAPSULE    Take 1 capsule (50,000 Units total) by mouth every 7 (seven) days.   Modified Medications   No medications on file   New Prescriptions   No medications  on file   Discontinued Medications   No medications on file  No orders of the defined types were placed in this encounter.   ASSESSMENT & PLAN: See problem based charting & AVS for pt instructions.

## 2015-10-13 NOTE — Assessment & Plan Note (Signed)
Made new insoles with scaphoid pads 2 weeks ago.  Slightly better with insoles.  No stretching or icing. Provided with HEP. Counseled on importance of routine HEP practice. Recommended motion control shoes. F/u 1 month for orthotics.

## 2015-10-19 ENCOUNTER — Telehealth: Payer: Self-pay

## 2015-11-17 ENCOUNTER — Encounter: Payer: Self-pay | Admitting: Family Medicine

## 2015-11-17 ENCOUNTER — Ambulatory Visit (INDEPENDENT_AMBULATORY_CARE_PROVIDER_SITE_OTHER): Payer: Medicaid Other | Admitting: Family Medicine

## 2015-11-17 VITALS — BP 120/72 | Ht <= 58 in | Wt 171.0 lb

## 2015-11-17 DIAGNOSIS — M170 Bilateral primary osteoarthritis of knee: Secondary | ICD-10-CM

## 2015-11-17 NOTE — Progress Notes (Signed)
  Catherine Patrick - 49 y.o. female MRN [id number]  Date of birth: 01/08/1966  CC: Left b/l knee pain  SUBJECTIVE:   HPI  B/l knee OA f/u:  - 09/29/2015 for b/l corticosteroid injections -- pain resolved for 6-7 weeks completely with injection --Returned 2 weeks ago, more pain in left knee vs. Right knee.   - No falls.  - Taking mobic 15mg  daily periodically.  Currently 2-3x/week.  -- Pain level 5/10 after taking mobic daily.  - B/l knee braces.   - Not doing any exercises.  - No swelling, locking, giving way,  or catching  Left plantar fasciitis: now resolved. Doing exercises bid.   ROS:     14 point RoS negative other than that listed in HPI above.   HISTORY: Past Medical, Surgical, Social, and Family History Reviewed & Updated per EMR.    Imaging review:  09/29/2015 b/l knee 4 view XRs show mild-moderate OA of the medial joint space on the left and severe DJD on the right.   OBJECTIVE: BP 120/72 mmHg  Ht 4\' 8"  (1.422 m)  Wt 171 lb (77.565 kg)  BMI 38.36 kg/m2  LMP 03/19/2014  Physical Exam  GEN: The patient is well-developed well-nourished female and in no acute distress. She is awake alert and oriented x3. SKIN: warm and well-perfused, no rash  EXTR: No lower extremity edema or calf tenderness Neuro: Strength 5/5 globally. Sensation intact throughout. DTRs 2/4 bilaterally. No focal deficits. Vasc: +2 bilateral distal pulses. No edema.   MSK:  B/l knee exam: Identical exam b/l. Examination of the b/l knee reveals range of motion from 0-115 with pain at the endpoint of motion (most notably at full flexion. No effusion. No overlying erythema or induration. Positive medial joint line tenderness to palpation. Equivocal  patellar grind test & compression. Ligamentously intact. NO thessaly performed.   MEDICATIONS, LABS & OTHER ORDERS: Previous Medications   CHOLECALCIFEROL (VITAMIN D PO)    Take 1 capsule by mouth daily.   DOXYCYCLINE (VIBRA-TABS) 100 MG TABLET    Take 1  tablet (100 mg total) by mouth 2 (two) times daily.   IBUPROFEN (ADVIL,MOTRIN) 600 MG TABLET    Take 1 tablet (600 mg total) by mouth every 6 (six) hours as needed (For pain.).   MELOXICAM (MOBIC) 15 MG TABLET    Take one tablet daily as needed for pain   NITROGLYCERIN (NITRODUR - DOSED IN MG/24 HR) 0.2 MG/HR PATCH    Place 1/4 patch to affected area daily.   VITAMIN D, ERGOCALCIFEROL, (DRISDOL) 50000 UNITS CAPS CAPSULE    Take 1 capsule (50,000 Units total) by mouth every 7 (seven) days.   Modified Medications   No medications on file   New Prescriptions   No medications on file   Discontinued Medications   No medications on file  No orders of the defined types were placed in this encounter.   ASSESSMENT & PLAN: See problem based charting & AVS for pt instructions.

## 2015-11-17 NOTE — Assessment & Plan Note (Addendum)
B/l OA. Right more severe with bone on bone of the medial compartment on the right vs. moderate joint space narrowing on the left. Pain more severe in left currently. B/l corticosteroid injections on 09/29/2015 with complete relief for 6 weeks with pain now returning for the last 1-2 weeks. Taking mobic with decent relief. - Continue PRN mobic.  - Continue with knee sleeve.  - Emphasized importance of quad strengthening.  - f/u in 6 weeks for repeat injections & sooner if needed. Discussed possibility of knee replacement in the future and the importance of weight loss or at least weight neutrality considering BMI is 38 currently.

## 2015-11-26 NOTE — Telephone Encounter (Signed)
error 

## 2015-12-01 NOTE — Telephone Encounter (Signed)
error 

## 2015-12-22 ENCOUNTER — Encounter: Payer: Self-pay | Admitting: Family Medicine

## 2015-12-22 ENCOUNTER — Ambulatory Visit (INDEPENDENT_AMBULATORY_CARE_PROVIDER_SITE_OTHER): Payer: Medicaid Other | Admitting: Family Medicine

## 2015-12-22 VITALS — BP 140/84 | Ht <= 58 in | Wt 170.0 lb

## 2015-12-22 DIAGNOSIS — M170 Bilateral primary osteoarthritis of knee: Secondary | ICD-10-CM

## 2015-12-22 MED ORDER — MELOXICAM 7.5 MG PO TABS: ORAL_TABLET | ORAL | Status: DC

## 2015-12-22 NOTE — Assessment & Plan Note (Addendum)
B/l medial and PF compartment OA. Right more severe with bone on bone of the medial compartment on the right vs. moderate joint space narrowing on the left. Pain more severe in left currently, although this changes visit to visit. B/l corticosteroid injections on 09/29/2015 with complete relief for 6 weeks with pain now has returned. Taking mobic with adequate relief.we discussed the long-term course of oral medications versus steroids.  We discussed the risk and and benefits of NSAIDs. - Continue PRN mobic as she feels this is providing adequate pain relief.  - Continue with knee sleeve.  - Emphasized importance of quad strengthening.  - f/u as needed for injections.

## 2015-12-22 NOTE — Progress Notes (Signed)
.  sp  Catherine Patrick - 50 y.o. female MRN [id number]  Date of birth: 1966-01-22  CC: Left b/l knee pain  SUBJECTIVE:   HPI  B/l knee OA f/u:  - 09/29/2015 for b/l corticosteroid injections - pain resolved for 6-7 weeks completely with injection - Taking mobic  most days with adequate pain relief.  - No falls.  - B/l knee braces.   - Trying to do leg strengthening exercises, but bending knee up rather than straight leg.  - No swelling, locking, giving way,  or catching. No swelling.   ROS:     14 point RoS negative other than that listed in HPI above.   HISTORY: Past Medical, Surgical, Social, and Family History Reviewed & Updated per EMR.    Imaging review:  09/29/2015 b/l knee 4 view XRs show mild-moderate OA of the medial joint space on the left and severe DJD on the right.   OBJECTIVE: BP 140/84 mmHg  Ht  (1.422 m)  Wt 170 lb (77.111 kg)  BMI 38.13 kg/m2  LMP 03/19/2014  Physical Exam  GEN: The patient is well-developed well-nourished female and in no acute distress. She is awake alert and oriented x3. SKIN: warm and well-perfused, no rash  EXTR: No lower extremity edema or calf tenderness Neuro: Strength 5/5 globally. Sensation intact throughout. DTRs 2/4 bilaterally. No focal deficits. Vasc: +2 bilateral distal pulses. No edema.   MSK:  B/l knee exam: Identical exam b/l. Examination of the b/l knee reveals range of motion from 0-115 with pain at the endpoint of motion (most notably at full flexion. No effusion. No overlying erythema or induration. Positive medial joint line tenderness to palpation. Equivocal  patellar grind test & compression. Ligamentously intact.    MEDICATIONS, LABS & OTHER ORDERS: Previous Medications   CHOLECALCIFEROL (VITAMIN D PO)    Take 1 capsule by mouth daily.   DOXYCYCLINE (VIBRA-TABS) 100 MG TABLET    Take 1 tablet (100 mg total) by mouth 2 (two) times daily.   IBUPROFEN (ADVIL,MOTRIN) 600 MG TABLET    Take 1 tablet (600 mg total)  by mouth every 6 (six) hours as needed (For pain.).   MELOXICAM (MOBIC) 15 MG TABLET    Take one tablet daily as needed for pain   NITROGLYCERIN (NITRODUR - DOSED IN MG/24 HR) 0.2 MG/HR PATCH    Place 1/4 patch to affected area daily.   VITAMIN D, ERGOCALCIFEROL, (DRISDOL) 50000 UNITS CAPS CAPSULE    Take 1 capsule (50,000 Units total) by mouth every 7 (seven) days.   Modified Medications   No medications on file   New Prescriptions   MELOXICAM (MOBIC) 7.5 MG TABLET    Take 1-2 tabs daily   Discontinued Medications   No medications on file  No orders of the defined types were placed in this encounter.   ASSESSMENT & PLAN: See problem based charting & AVS for pt instructions.

## 2016-01-28 ENCOUNTER — Encounter (HOSPITAL_COMMUNITY): Payer: Self-pay | Admitting: Emergency Medicine

## 2016-01-28 ENCOUNTER — Emergency Department (HOSPITAL_COMMUNITY)
Admission: EM | Admit: 2016-01-28 | Discharge: 2016-01-28 | Disposition: A | Discharge status: Home / Self Care | Payer: Medicaid Other | Source: Home / Self Care | Attending: Family Medicine | Admitting: Family Medicine

## 2016-01-28 DIAGNOSIS — A084 Viral intestinal infection, unspecified: Secondary | ICD-10-CM | POA: Diagnosis not present

## 2016-01-28 MED ORDER — ONDANSETRON HCL 4 MG PO TABS: 4.0000 mg | ORAL_TABLET | Freq: Three times a day (TID) | ORAL | Status: DC | PRN

## 2016-01-28 NOTE — ED Notes (Signed)
From 2:00 am to 3:00am had multiple episodes of vomiting and diarrhea.  None now.  Patient denies pain.  Patient is holding down fluids and fruit this morning.

## 2016-01-28 NOTE — Discharge Instructions (Signed)
You likely contracted a viral gut infection called gastroenteritis. This should be completely gone at this point. Please use the zofran for additional nausea.  Please start a probiotic.    Viral Gastroenteritis Viral gastroenteritis is also known as stomach flu. This condition affects the stomach and intestinal tract. It can cause sudden diarrhea and vomiting. The illness typically lasts 3 to 8 days. Most people develop an immune response that eventually gets rid of the virus. While this natural response develops, the virus can make you quite ill. CAUSES  Many different viruses can cause gastroenteritis, such as rotavirus or noroviruses. You can catch one of these viruses by consuming contaminated food or water. You may also catch a virus by sharing utensils or other personal items with an infected person or by touching a contaminated surface. SYMPTOMS  The most common symptoms are diarrhea and vomiting. These problems can cause a severe loss of body fluids (dehydration) and a body salt (electrolyte) imbalance. Other symptoms may include:  Fever.  Headache.  Fatigue.  Abdominal pain. DIAGNOSIS  Your caregiver can usually diagnose viral gastroenteritis based on your symptoms and a physical exam. A stool sample may also be taken to test for the presence of viruses or other infections. TREATMENT  This illness typically goes away on its own. Treatments are aimed at rehydration. The most serious cases of viral gastroenteritis involve vomiting so severely that you are not able to keep fluids down. In these cases, fluids must be given through an intravenous line (IV). HOME CARE INSTRUCTIONS   Drink enough fluids to keep your urine clear or pale yellow. Drink small amounts of fluids frequently and increase the amounts as tolerated.  Ask your caregiver for specific rehydration instructions.  Avoid:  Foods high in sugar.  Alcohol.  Carbonated drinks.  Tobacco.  Juice.  Caffeine  drinks.  Extremely hot or cold fluids.  Fatty, greasy foods.  Too much intake of anything at one time.  Dairy products until 24 to 48 hours after diarrhea stops.  You may consume probiotics. Probiotics are active cultures of beneficial bacteria. They may lessen the amount and number of diarrheal stools in adults. Probiotics can be found in yogurt with active cultures and in supplements.  Wash your hands well to avoid spreading the virus.  Only take over-the-counter or prescription medicines for pain, discomfort, or fever as directed by your caregiver. Do not give aspirin to children. Antidiarrheal medicines are not recommended.  Ask your caregiver if you should continue to take your regular prescribed and over-the-counter medicines.  Keep all follow-up appointments as directed by your caregiver. SEEK IMMEDIATE MEDICAL CARE IF:   You are unable to keep fluids down.  You do not urinate at least once every 6 to 8 hours.  You develop shortness of breath.  You notice blood in your stool or vomit. This may look like coffee grounds.  You have abdominal pain that increases or is concentrated in one small area (localized).  You have persistent vomiting or diarrhea.  You have a fever.  The patient is a child younger than 3 months, and he or she has a fever.  The patient is a child older than 3 months, and he or she has a fever and persistent symptoms.  The patient is a child older than 3 months, and he or she has a fever and symptoms suddenly get worse.  The patient is a baby, and he or she has no tears when crying. MAKE SURE YOU:  Understand these instructions.  Will watch your condition.  Will get help right away if you are not doing well or get worse.   This information is not intended to replace advice given to you by your health care provider. Make sure you discuss any questions you have with your health care provider.   Document Released: 11/21/2005 Document Revised:  02/13/2012 Document Reviewed: 09/07/2011 Elsevier Interactive Patient Education [employer]2016 [employer]vier Inc.

## 2016-01-28 NOTE — ED Provider Notes (Signed)
CSN: [id number]     Arrival date & time 01/28/16  1327 History   First MD Initiated Contact with Patient 01/28/16 1447     Chief Complaint  Patient presents with  . Emesis  . Diarrhea   (Consider location/radiation/quality/duration/timing/severity/associated sxs/prior Treatment) HPI   Nausea vomiting diarrhea. Started approximately 2 AM and lasted until 3:30 AM. Approximately 3-4 episodes of nonbloody nonbilious emesis and watery diarrhea. Denied abdominal pain, headache, fevers, dysuria, frequency, back pain, LC. No recent antibiotic usage. States that she feels much better at this point time. 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  Past Medical History  Diagnosis Date  . Kidney stones    Past Surgical History  Procedure Laterality Date  . Hernia repair    . Cesarean section     Family History  Problem Relation Age of Onset  . Cancer Neg Hx   . Diabetes Neg Hx   . Heart failure Neg Hx   . Hyperlipidemia Neg Hx   . Hypertension Neg Hx    Social History  Substance Use Topics  . Smoking status: Never Smoker   . Smokeless tobacco: Never Used  . Alcohol Use: No   OB History    Gravida Para Term Preterm AB TAB SAB Ectopic Multiple Living            3     Review of Systems Per HPI with all other pertinent systems negative.   Allergies  Review of patient's allergies indicates no known allergies.  Home Medications   Prior to Admission medications   Medication Sig Start Date End Date Taking? Authorizing Provider  meloxicam (MOBIC) 7.5 MG tablet Take 1-2 tabs daily 12/22/15  Yes Guinevere Scarlet, MD  Vitamin D, Ergocalciferol, (DRISDOL) 50000 UNITS CAPS capsule Take 1 capsule (50,000 Units total) by mouth every 7 (seven) days. 04/02/15  Yes Doris Cheadle, MD  Cholecalciferol (VITAMIN D PO) Take 1 capsule by mouth daily.    Historical Provider, MD  doxycycline (VIBRA-TABS) 100 MG tablet Take  1 tablet (100 mg total) by mouth 2 (two) times daily. Patient not taking: Reported on 01/28/2016 08/19/15   Dessa Phi, MD  ibuprofen (ADVIL,MOTRIN) 600 MG tablet Take 1 tablet (600 mg total) by mouth every 6 (six) hours as needed (For pain.). 04/01/15   Doris Cheadle, MD  meloxicam (MOBIC) 15 MG tablet Take one tablet daily as needed for pain 09/29/15   Guinevere Scarlet, MD  nitroGLYCERIN (NITRODUR - DOSED IN MG/24 HR) 0.2 mg/hr patch Place 1/4 patch to affected area daily. 09/29/15   Guinevere Scarlet, MD  ondansetron (ZOFRAN) 4 MG tablet Take 1 tablet (4 mg total) by mouth every 8 (eight) hours as needed for nausea or vomiting. 01/28/16   Ozella Rocks, MD   Meds Ordered and Administered this Visit  Medications - No data to display  BP 143/93 mmHg  Pulse 93  Temp(Src) 99.1 F (37.3 C) (Oral)  Resp 16  SpO2 96%  LMP 03/19/2014 No data found.   Physical Exam Physical Exam  Constitutional: oriented to person, place, and time. appears well-developed and well-nourished. No distress.  HENT:  Head: Normocephalic and atraumatic.  Eyes: EOMI. PERRL.  Neck: Normal range of motion.  Cardiovascular: RRR, no m/r/g, 2+ distal pulses,  Pulmonary/Chest: Effort normal and breath sounds normal. No respiratory distress.  Abdominal: Soft. Bowel sounds are normal. NonTTP, no distension.  Musculoskeletal: Normal range of motion. Non  ttp, no effusion.  Neurological: alert and oriented to person, place, and time.  Skin: Skin is warm. No rash noted. non diaphoretic.  Psychiatric: normal mood and affect. behavior is normal. Judgment and thought content normal.   ED Course  Procedures (including critical care time)  Labs Review Labs Reviewed - No data to display  Imaging Review No results found.   Visual Acuity Review  Right Eye Distance:   Left Eye Distance:   Bilateral Distance:    Right Eye Near:   Left Eye Near:    Bilateral Near:         MDM   1. Viral gastroenteritis     Likely viral gastroenteritis. Essentially resolved at this time. Will give patient perception for Zofran in case symptoms return and recommending she start a probiotic.  I have verbally reviewed the discharge instructions with the patient. A printed AVS was given to the patient.  All questions were answered prior to discharge.    Ozella Rocks, MD 01/28/16 [phone]

## 2016-03-02 ENCOUNTER — Other Ambulatory Visit: Payer: Self-pay | Admitting: Internal Medicine

## 2016-03-02 ENCOUNTER — Other Ambulatory Visit: Payer: Self-pay | Admitting: Family Medicine

## 2016-03-02 DIAGNOSIS — R921 Mammographic calcification found on diagnostic imaging of breast: Secondary | ICD-10-CM

## 2016-03-08 ENCOUNTER — Encounter: Payer: Self-pay | Admitting: Family Medicine

## 2016-03-08 ENCOUNTER — Ambulatory Visit (INDEPENDENT_AMBULATORY_CARE_PROVIDER_SITE_OTHER): Payer: Medicaid Other | Admitting: Family Medicine

## 2016-03-08 VITALS — BP 148/78 | Ht <= 58 in | Wt 170.0 lb

## 2016-03-08 DIAGNOSIS — M170 Bilateral primary osteoarthritis of knee: Secondary | ICD-10-CM | POA: Diagnosis present

## 2016-03-08 MED ORDER — METHYLPREDNISOLONE ACETATE 40 MG/ML IJ SUSP
40.0000 mg | Freq: Once | INTRAMUSCULAR | Status: AC
  Administered 2016-03-08: 40 mg via INTRA_ARTICULAR

## 2016-03-08 MED ORDER — MELOXICAM 7.5 MG PO TABS: ORAL_TABLET | ORAL | Status: DC

## 2016-03-08 NOTE — Progress Notes (Signed)
.sp  Catherine Patrick - 50 y.o. female MRN [id number]  Date of birth: [birth year]  CC: Left b/l knee pain  SUBJECTIVE:   HPI  B/l knee OA f/u:  - 09/29/2015 for b/l corticosteroid injections - Patient reports her pain returned 3 weeks ago. - Her pain became bad enough that her right knee gave out, sending her falling backwards. - She ran out of her Loxitane 2 weeks ago. - B/l knee braces continue to help  - Trying to do leg strengthening exercises, but bending knee up rather than straight leg.  - No swelling, locking,  or catching. No swelling.   ROS:     14 point RoS negative other than that listed in HPI above.   HISTORY: Past Medical, Surgical, Social, and Family History Reviewed & Updated per EMR.    Imaging review:  09/29/2015 b/l knee 4 view XRs show mild-moderate OA of the medial joint space on the left and severe DJD on the right.   OBJECTIVE: BP 148/78 mmHg  Ht  (1.422 m)  Wt 170 lb (77.111 kg)  BMI 38.13 kg/m2  LMP 03/19/2014  Physical Exam  GEN: The patient is well-developed well-nourished female and in no acute distress. She is awake alert and oriented x3. SKIN: warm and well-perfused, no rash  EXTR: No lower extremity edema or calf tenderness Neuro: Strength 5/5 globally. Sensation intact throughout. DTRs 2/4 bilaterally. No focal deficits. Vasc: +2 bilateral distal pulses. No edema.   MSK:  B/l knee exam: Identical exam b/l. Examination of the b/l knee reveals range of motion from 0-115 with pain at the terminal range of motion (most notably at full flexion). No effusion. No overlying erythema or induration. Positive medial joint line tenderness to palpation. Equivocal  patellar grind test & compression. Ligamentously intact.    MEDICATIONS, LABS & OTHER ORDERS: Previous Medications   CHOLECALCIFEROL (VITAMIN D PO)    Take 1 capsule by mouth daily.   DOXYCYCLINE (VIBRA-TABS) 100 MG TABLET    Take 1 tablet (100 mg total) by mouth 2 (two) times daily.   IBUPROFEN (ADVIL,MOTRIN) 600 MG TABLET    Take 1 tablet (600 mg total) by mouth every 6 (six) hours as needed (For pain.).   MELOXICAM (MOBIC) 15 MG TABLET    Take one tablet daily as needed for pain   MELOXICAM (MOBIC) 7.5 MG TABLET    Take 1-2 tabs daily   NITROGLYCERIN (NITRODUR - DOSED IN MG/24 HR) 0.2 MG/HR PATCH    Place 1/4 patch to affected area daily.   ONDANSETRON (ZOFRAN) 4 MG TABLET    Take 1 tablet (4 mg total) by mouth every 8 (eight) hours as needed for nausea or vomiting.   VITAMIN D, ERGOCALCIFEROL, (DRISDOL) 50000 UNITS CAPS CAPSULE    Take 1 capsule (50,000 Units total) by mouth every 7 (seven) days.   Modified Medications   No medications on file   New Prescriptions   No medications on file   Discontinued Medications   No medications on file   Orders Placed This Encounter  Procedures  . Ambulatory referral to Orthopedic Surgery   ASSESSMENT & PLAN: B/l medial and PF compartment OA. Right more severe with bone on bone of the medial compartment on the right vs. moderate joint space narrowing on the left.  Acute worsening 3 weeks ago. Injections as above. Refill on 7.5 mg meloxicam today. Continue with knee sleeve. We will refer to or has her pain is getting worse and she is considering  a knee replacement on the right side. Follow-up as needed.  Consent obtained and verified. Sterile betadine prep. Furthur cleansed with alcohol. Topical analgesic spray: Ethyl chloride. Joint: Right knee Approached in typical fashion with: Anteriolateral Completed without difficulty Meds: 2cc methylprednisolone ( ) & 4 cc of 1% xylocaine Needle: 25g x 1.5' Aftercare instructions and Red flags advised.  Consent obtained and verified. Sterile betadine prep. Furthur cleansed with alcohol. Topical analgesic spray: Ethyl chloride. Joint: Left knee Approached in typical fashion with: Anteriolateral Completed without difficulty Meds: 2cc methylprednisolone ( ) & 4 cc of 1%  xylocaine Needle: 25g x 1.5' Aftercare instructions and Red flags advised.

## 2016-03-25 ENCOUNTER — Ambulatory Visit: Payer: Medicaid Other | Attending: Family Medicine

## 2016-04-05 ENCOUNTER — Telehealth: Payer: Self-pay | Admitting: Family Medicine

## 2016-04-05 NOTE — Telephone Encounter (Signed)
Pt said  That receive a call from Imaging that need to call to talk to Dr Armen Pickup[employer]ches  Thank you

## 2016-04-08 NOTE — Telephone Encounter (Signed)
[facility]? Please call imagine or patient to get more information. What is needed from me?

## 2016-04-26 NOTE — Patient Instructions (Signed)
[facility] [address] [facility] [place] Thursday 04/28/16 at 1030am Arrival time is 10am [address], [place], [place] [address] Phone: [phone]

## 2016-07-11 ENCOUNTER — Encounter (HOSPITAL_COMMUNITY): Payer: Self-pay | Admitting: Emergency Medicine

## 2016-07-11 ENCOUNTER — Ambulatory Visit (HOSPITAL_COMMUNITY)
Admission: EM | Admit: 2016-07-11 | Discharge: 2016-07-11 | Disposition: A | Discharge status: Home / Self Care | Payer: Medicaid Other | Attending: Emergency Medicine | Admitting: Emergency Medicine

## 2016-07-11 DIAGNOSIS — J020 Streptococcal pharyngitis: Secondary | ICD-10-CM

## 2016-07-11 LAB — POCT RAPID STREP A: Streptococcus, Group A Screen (Direct): POSITIVE — AB

## 2016-07-11 MED ORDER — IBUPROFEN 800 MG PO TABS
800.0000 mg | ORAL_TABLET | Freq: Once | ORAL | Status: AC
Start: 2016-07-11 — End: 2016-07-11
  Administered 2016-07-11: 800 mg via ORAL

## 2016-07-11 MED ORDER — AMOXICILLIN 875 MG PO TABS: 875.0000 mg | ORAL_TABLET | Freq: Two times a day (BID) | ORAL | 0 refills | Status: DC

## 2016-07-11 MED ORDER — IBUPROFEN 800 MG PO TABS
ORAL_TABLET | ORAL | Status: AC
  Filled 2016-07-11: qty 1

## 2016-07-11 NOTE — ED Triage Notes (Signed)
The patient presented to the [facility] with a complaint of a sore throat and fever x 2 days. 

## 2016-07-11 NOTE — Discharge Instructions (Signed)
You have strep throat. Take amoxicillin twice a day for the next 10 days. You can take ibuprofen 600 mg every 6 hours as needed for fever. You can do salt water gargles, tea with honey, or over-the-counter Chloraseptic spray to help with the pain. You should see improvement within 24-48 hours of starting the antibiotic. Follow-up as needed.

## 2016-07-11 NOTE — ED Provider Notes (Signed)
[facility]    CSN: [id number] Arrival date & time: 07/11/16  1702  First Provider Contact:  First MD Initiated Contact with Patient 07/11/16 1827        History   Chief Complaint Chief Complaint  Patient presents with  . Sore Throat    HPI Catherine Patrick is a 50 y.o. female.   She is a 50 year old woman here for evaluation of fever and sore throat. Symptoms started 2 days ago. She reports fever, headaches, body aches, and sore throat. She also reports bilateral ear pain. No nasal congestion or rhinorrhea. No cough or shortness of breath. She has taken some ibuprofen with temporary improvement of fever. She does work at a daycare.    Past Medical History:  Diagnosis Date  . Kidney stones     Patient Active Problem List   Diagnosis Date Noted  . Osteoarthritis of knee 09/30/2015  . Left lateral ankle pain 09/30/2015  . Wound of left breast 08/19/2015  . Pap smear for cervical cancer screening 07/31/2015  . Breast pain, left 07/31/2015  . Plantar fasciitis of left foot 12/18/2013  . 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 Hydronephrosis, right 05/30/2013  . Nephrolithiasis 05/30/2013    Past Surgical History:  Procedure Laterality Date  . CESAREAN SECTION    . HERNIA REPAIR      OB History    Gravida Para Term Preterm AB Living             3   SAB TAB Ectopic Multiple Live Births                   Home Medications    Prior to Admission medications   Medication Sig Start Date End Date Taking? Authorizing Provider  ibuprofen (ADVIL,MOTRIN) 600 MG tablet Take 1 tablet (600 mg total) by mouth every 6 (six) hours as needed (For pain.). 04/01/15  Yes Doris Cheadle, MD  meloxicam (MOBIC) 7.5 MG tablet Take 1-2 tabs daily 03/08/16  Yes Guinevere Scarlet, MD  amoxicillin (AMOXIL) 875 MG tablet Take 1 tablet (875 mg total) by mouth 2 (two) times daily. 07/11/16   Charm Rings, MD  Cholecalciferol (VITAMIN D PO) Take  1 capsule by mouth daily.    Historical Provider, MD  nitroGLYCERIN (NITRODUR - DOSED IN MG/24 HR) 0.2 mg/hr patch Place 1/4 patch to affected area daily. 09/29/15   Guinevere Scarlet, MD  ondansetron (ZOFRAN) 4 MG tablet Take 1 tablet (4 mg total) by mouth every 8 (eight) hours as needed for nausea or vomiting. 01/28/16   Ozella Rocks, MD  Vitamin D, Ergocalciferol, (DRISDOL) 50000 UNITS CAPS capsule Take 1 capsule (50,000 Units total) by mouth every 7 (seven) days. 04/02/15   Doris Cheadle, MD    Family History Family History  Problem Relation Age of Onset  . Cancer Neg Hx   . Diabetes Neg Hx   . Heart failure Neg Hx   . Hyperlipidemia Neg Hx   . Hypertension Neg Hx     Social History Social History  Substance Use Topics  . Smoking status: Never Smoker  . Smokeless tobacco: Never Used  . Alcohol use No     Allergies   Review of patient's allergies indicates no known allergies.   Review of Systems Review of Systems  Constitutional: Positive for fever.  HENT: Positive for ear pain and sore throat. Negative for congestion and rhinorrhea.   Respiratory: Negative for cough and shortness  of breath.   Gastrointestinal: Negative for vomiting.  Musculoskeletal: Positive for myalgias.  Neurological: Positive for headaches.     Physical Exam Triage Vital Signs ED Triage Vitals  Enc Vitals Group     BP 07/11/16 1812 132/92     Pulse Rate 07/11/16 1812 104     Resp 07/11/16 1812 16     Temp 07/11/16 1812 102.9 F (39.4 C)     Temp src --      SpO2 07/11/16 1812 100 %     Weight --      Height --      Head Circumference --      Peak Flow --      Pain Score 07/11/16 1825 10     Pain Loc --      Pain Edu? --      Excl. in GC? --    No data found.   Updated Vital Signs BP 132/92 (BP Location: Left Arm)   Pulse 104   Temp 102.9 F (39.4 C)   Resp 16   LMP 03/19/2014   SpO2 100%   Visual Acuity Right Eye Distance:   Left Eye Distance:   Bilateral Distance:     Right Eye Near:   Left Eye Near:    Bilateral Near:     Physical Exam  Constitutional: She is oriented to person, place, and time. She appears well-developed and well-nourished. No distress.  HENT:  Mouth/Throat: Oropharyngeal exudate present.  Oropharynx is erythematous with exudate present. TMs normal bilaterally.  Neck: Neck supple.  Cardiovascular: Normal rate, regular rhythm and normal heart sounds.   No murmur heard. Pulmonary/Chest: Effort normal and breath sounds normal. No respiratory distress. She has no wheezes. She has no rales.  Lymphadenopathy:    She has no cervical adenopathy.  Neurological: She is alert and oriented to person, place, and time.     UC Treatments / Results  Labs (all labs ordered are listed, but only abnormal results are displayed) Labs Reviewed  POCT RAPID STREP A - Abnormal; Notable for the following:       Result Value   Streptococcus, Group A Screen (Direct) POSITIVE (*)    All other components within normal limits    EKG  EKG Interpretation None       Radiology No results found.  Procedures Procedures (including critical care time)  Medications Ordered in UC Medications  ibuprofen (ADVIL,MOTRIN) tablet 800 mg (800 mg Oral Given 07/11/16 1833)     Initial Impression / Assessment and Plan / UC Course  I have reviewed the triage vital signs and the nursing notes.  Pertinent labs & imaging results that were available during my care of the patient were reviewed by me and considered in my medical decision making (see chart for details).  Clinical Course    Positive strep. Treat with amoxicillin. Symptomatic treatment discussed. Work note provided. Follow-up as needed.  Final Clinical Impressions(s) / UC Diagnoses   Final diagnoses:  Strep pharyngitis    New Prescriptions New Prescriptions   AMOXICILLIN (AMOXIL) 875 MG TABLET    Take 1 tablet (875 mg total) by mouth 2 (two) times daily.     Charm RingsErin J Xitlalli Newhard,  MD 07/11/16 [phone]

## 2016-08-10 ENCOUNTER — Ambulatory Visit: Payer: Self-pay | Admitting: Sports Medicine

## 2016-08-17 ENCOUNTER — Ambulatory Visit: Payer: Medicaid Other | Attending: Internal Medicine

## 2016-09-19 ENCOUNTER — Encounter: Payer: Self-pay | Admitting: Family Medicine

## 2016-09-19 ENCOUNTER — Ambulatory Visit: Payer: Medicaid Other | Attending: Family Medicine | Admitting: Family Medicine

## 2016-09-19 ENCOUNTER — Other Ambulatory Visit: Payer: Self-pay

## 2016-09-19 VITALS — BP 131/83 | HR 83 | Temp 98.5°F | Ht <= 58 in | Wt 167.2 lb

## 2016-09-19 DIAGNOSIS — M79602 Pain in left arm: Secondary | ICD-10-CM | POA: Diagnosis present

## 2016-09-19 DIAGNOSIS — M170 Bilateral primary osteoarthritis of knee: Secondary | ICD-10-CM | POA: Diagnosis not present

## 2016-09-19 DIAGNOSIS — M5412 Radiculopathy, cervical region: Secondary | ICD-10-CM | POA: Insufficient documentation

## 2016-09-19 MED ORDER — ACETAMINOPHEN-CODEINE #3 300-30 MG PO TABS: 1.0000 | ORAL_TABLET | Freq: Every evening | ORAL | 0 refills | Status: DC | PRN

## 2016-09-19 MED ORDER — METHYLPREDNISOLONE 4 MG PO TBPK: ORAL_TABLET | ORAL | 0 refills | Status: DC

## 2016-09-19 MED FILL — METHYLPREDNISOLONE 4 MG TAB: 4 | 6 days supply | Qty: 21 | Fill #0

## 2016-09-19 MED FILL — ACETAMINOPHEN/COD #3 TABLET: 300-30 | 30 days supply | Qty: 60 | Fill #0

## 2016-09-19 NOTE — Assessment & Plan Note (Signed)
A: chronic knee pain  P: Add tylenol #3 at night Patient applied for financial assistance so she can see ortho

## 2016-09-19 NOTE — Progress Notes (Signed)
Pain in both knees and left arm.  Pt declined flu shot.

## 2016-09-19 NOTE — Assessment & Plan Note (Signed)
A: left arm pain with radicular symptoms. Neuro exam without evidence of CVA. EKG without evidence of ischemia. 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

## 2016-09-19 NOTE — Progress Notes (Signed)
Subjective:  Patient ID: Catherine AlimentSulwa Patrick, female    DOB: 07/26/1966  Age: 50 y.o. MRN: [id number]  CC: Knee Pain   HPI Catherine Patrick is a R handed female with known bilateral knee osteoarthritis she  presents for    1. Left arm pain: started two weeks ago. She has had two episodes of pain. She describes pain in the entire arm from superior-lateral-posterior shoulder down to her palm. Pain with rest. Unable to raise arm.  The first episode two weeks ago She massaged her arm and took meloxicam this resolved her pain. Her pain returned yesterday around 12 PM she has the same pain, pain worsened throughout the day. At 2 AM this morning she woke up with pain, and  took meloxicam and laid down. The pain improved.  This morning she went to work and the pain returned at 8:30 AM. She took another meloxicam.. She described electric shock feeling in the palm of her hand, up arm to shoulder. Pain level is 8/10.  She also has numbness in her L arm. She denies neck pain. She reports a fall 5 months onto her back on concrete but no more recent injuries. Some weakness in her L hand as well. She has not had similar symptoms previously. 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    2. Knee pains: she was working with sports medicine. [facility] but was unable to see ortho due to lack of insurance. She is applying for financial assistance. She was getting steroid injections in her knees at sports medicine that relieved pain for only one month. She is now taking meloxicam 15 mg twice in AM and PM for pain. R knee pain is 8/10. L knee pain is 5/10. Pain is exacerbated by knee flexion.   Social History  Substance Use Topics  . Smoking status: Never Smoker  . Smokeless tobacco: Never Used  . Alcohol use No    Outpatient Medications Prior to Visit  Medication Sig Dispense Refill  . Cholecalciferol (VITAMIN D PO) Take 1 capsule by mouth daily.    . meloxicam  (MOBIC) 7.5 MG tablet Take 1-2 tabs daily 60 tablet 2  . Vitamin D, Ergocalciferol, (DRISDOL) 50000 UNITS CAPS capsule Take 1 capsule (50,000 Units total) by mouth every 7 (seven) days. 12 capsule 0  . amoxicillin (AMOXIL) 875 MG tablet Take 1 tablet (875 mg total) by mouth 2 (two) times daily. (Patient not taking: Reported on 09/19/2016) 20 tablet 0  . ibuprofen (ADVIL,MOTRIN) 600 MG tablet Take 1 tablet (600 mg total) by mouth every 6 (six) hours as needed (For pain.). (Patient not taking: Reported on 09/19/2016) 90 tablet 0  . nitroGLYCERIN (NITRODUR - DOSED IN MG/24 HR) 0.2 mg/hr patch Place 1/4 patch to affected area daily. (Patient not taking: Reported on 09/19/2016) 30 patch 1  . ondansetron (ZOFRAN) 4 MG tablet Take 1 tablet (4 mg total) by mouth every 8 (eight) hours as needed for nausea or vomiting. (Patient not taking: Reported on 09/19/2016) 20 tablet 0   No facility-administered medications prior to visit.     ROS Review of Systems  Constitutional: Negative for chills and fever.  Eyes: Negative for visual disturbance.  Respiratory: Negative for shortness of breath.   Cardiovascular: Negative for chest pain.  Gastrointestinal: Negative for abdominal pain and blood in stool.  Musculoskeletal: Positive for arthralgias. Negative for back pain.  Skin: Negative for rash.  Allergic/Immunologic: Negative  for immunocompromised state.  Hematological: Negative for adenopathy. Does not bruise/bleed easily.  Psychiatric/Behavioral: Negative for dysphoric mood and suicidal ideas.    Objective:  BP 131/83 (BP Location: Right Arm, Patient Position: Sitting, Cuff Size: Small)   Pulse 83   Temp 98.5 F (36.9 C) (Oral)   Ht 4\' 8"  (1.422 m)   Wt 167 lb 3.2 oz (75.8 kg)   LMP 03/19/2014   SpO2 97%   BMI 37.49 kg/m   BP/Weight 09/19/2016 07/11/2016 03/08/2016  Systolic BP 131 132 148  Diastolic BP 83 92 78  Wt. (Lbs) 167.2 - 170  BMI 37.49 - 38.13    Physical Exam  Constitutional: She  is oriented to person, place, and time. She appears well-developed and well-nourished. No distress.  HENT:  Head: Normocephalic and atraumatic.  Neck: Normal range of motion. Neck supple.  Cardiovascular: Normal rate, regular rhythm, normal heart sounds and intact distal pulses.  Frequent extrasystoles are present.  Pulmonary/Chest: Effort normal and breath sounds normal.  Musculoskeletal: She exhibits no edema.       Left shoulder: She exhibits decreased range of motion, tenderness, pain (lateral and posterior shoulder pain ) and spasm. She exhibits no bony tenderness, no swelling, no effusion, no crepitus, no deformity, no laceration, normal pulse and normal strength.  Lymphadenopathy:    She has no cervical adenopathy.  Neurological: She is alert and oriented to person, place, and time. She has normal reflexes. She displays normal reflexes. No cranial nerve deficit. She exhibits abnormal muscle tone. Coordination normal.  Skin: Skin is warm and dry. No rash noted.  Psychiatric: She has a normal mood and affect.   EKG: normal EKG, normal sinus rhythm, PAC's noted. Assessment & Plan:  Catherine Patrick was seen today for knee pain.  Diagnoses and all orders for this visit:  Primary osteoarthritis of both knees -     acetaminophen-codeine (TYLENOL #3) 300-30 MG tablet; Take 1-2 tablets by mouth at bedtime as needed for moderate pain.  Cervical radiculopathy -     acetaminophen-codeine (TYLENOL #3) 300-30 MG tablet; Take 1-2 tablets by mouth at bedtime as needed for moderate pain. -     methylPREDNISolone (MEDROL DOSEPAK) 4 MG TBPK tablet; Taper per packet insert  Left arm pain   There are no diagnoses linked to this encounter.  No orders of the defined types were placed in this encounter.   Follow-up: Return in about 1 week (around 09/26/2016) for L arm pains from cervical radiculopathy .   Dessa Phi MD

## 2016-09-19 NOTE — Patient Instructions (Addendum)
Catherine Patrick was seen today for knee pain.  Diagnoses and all orders for this visit:  Primary osteoarthritis of both knees -     acetaminophen-codeine (TYLENOL #3) 300-30 MG tablet; Take 1-2 tablets by mouth at bedtime as needed for moderate pain.  Cervical radiculopathy -     acetaminophen-codeine (TYLENOL #3) 300-30 MG tablet; Take 1-2 tablets by mouth at bedtime as needed for moderate pain. -     methylPREDNISolone (MEDROL DOSEPAK) 4 MG TBPK tablet; Taper per packet insert  Left arm pain   F/u in 1 week for left arm pain from cervical radiculopathy   Dr. Armen PickupFunches    Cervical Radiculopathy Cervical radiculopathy means that a nerve in the neck is pinched or bruised. This can cause pain or loss of feeling (numbness) that runs from your neck to your arm and fingers. HOME CARE Managing Pain  Take over-the-counter and prescription medicines only as told by your doctor.  If directed, put ice on the injured or painful area.  Put ice in a plastic bag.  Place a towel between your skin and the bag.  Leave the ice on for 20 minutes, 2-3 times per day.  If ice does not help, you can try using heat. Take a warm shower or warm bath, or use a heat pack as told by your doctor.  You may try a gentle neck and shoulder massage. Activity  Rest as needed. Follow instructions from your doctor about any activities to avoid.  Do exercises as told by your doctor or physical therapist. General Instructions   If you were given a soft collar, wear it as told by your doctor.  Use a flat pillow when you sleep.  Keep all follow-up visits as told by your doctor. This is important. GET HELP IF:  Your condition does not improve with treatment. GET HELP RIGHT AWAY IF:   Your pain gets worse and is not controlled with medicine.  You lose feeling or feel weak in your hand, arm, face, or leg.  You have a fever.  You have a stiff neck.  You cannot control when you poop or pee (have  incontinence).  You have trouble with walking, balance, or talking.   This information is not intended to replace advice given to you by your health care provider. Make sure you discuss any questions you have with your health care provider.     Document Released: 11/10/2011 Document Revised: 08/12/2015 Document Reviewed: 01/15/2015 Elsevier Interactive Patient Education [employer]2016 Elsevier Inc.

## 2016-10-22 ENCOUNTER — Encounter (HOSPITAL_COMMUNITY): Payer: Self-pay | Admitting: Emergency Medicine

## 2016-10-22 ENCOUNTER — Emergency Department (HOSPITAL_COMMUNITY)
Admission: EM | Admit: 2016-10-22 | Discharge: 2016-10-23 | Disposition: A | Discharge status: Home / Self Care | Payer: Medicaid Other | Attending: Emergency Medicine | Admitting: Emergency Medicine

## 2016-10-22 ENCOUNTER — Emergency Department (HOSPITAL_COMMUNITY): Payer: Medicaid Other

## 2016-10-22 DIAGNOSIS — N202 Calculus of kidney with calculus of ureter: Secondary | ICD-10-CM | POA: Insufficient documentation

## 2016-10-22 DIAGNOSIS — N200 Calculus of kidney: Secondary | ICD-10-CM

## 2016-10-22 DIAGNOSIS — R109 Unspecified abdominal pain: Secondary | ICD-10-CM | POA: Diagnosis present

## 2016-10-22 DIAGNOSIS — Z791 Long term (current) use of non-steroidal anti-inflammatories (NSAID): Secondary | ICD-10-CM | POA: Diagnosis not present

## 2016-10-22 LAB — CBC
HCT: 42.2 % (ref 36.0–46.0)
Hemoglobin: 14 g/dL (ref 12.0–15.0)
MCH: 29.8 pg (ref 26.0–34.0)
MCHC: 33.2 g/dL (ref 30.0–36.0)
MCV: 89.8 fL (ref 78.0–100.0)
PLATELETS: 211 10*3/uL (ref 150–400)
RBC: 4.7 MIL/uL (ref 3.87–5.11)
RDW: 13.8 % (ref 11.5–15.5)
WBC: 9.3 10*3/uL (ref 4.0–10.5)

## 2016-10-22 LAB — COMPREHENSIVE METABOLIC PANEL
ALT: 20 U/L (ref 14–54)
AST: 23 U/L (ref 15–41)
Albumin: 4 g/dL (ref 3.5–5.0)
Alkaline Phosphatase: 119 U/L (ref 38–126)
Anion gap: 8 (ref 5–15)
BILIRUBIN TOTAL: 0.7 mg/dL (ref 0.3–1.2)
BUN: 21 mg/dL — AB (ref 6–20)
CO2: 28 mmol/L (ref 22–32)
CREATININE: 0.67 mg/dL (ref 0.44–1.00)
Calcium: 9.8 mg/dL (ref 8.9–10.3)
Chloride: 105 mmol/L (ref 101–111)
Glucose, Bld: 115 mg/dL — ABNORMAL HIGH (ref 65–99)
POTASSIUM: 3.9 mmol/L (ref 3.5–5.1)
Sodium: 141 mmol/L (ref 135–145)
TOTAL PROTEIN: 7.5 g/dL (ref 6.5–8.1)

## 2016-10-22 LAB — LIPASE, BLOOD: Lipase: 39 U/L (ref 11–51)

## 2016-10-22 MED ORDER — ONDANSETRON HCL 4 MG/2ML IJ SOLN
4.0000 mg | Freq: Once | INTRAMUSCULAR | Status: AC
  Administered 2016-10-22: 4 mg via INTRAVENOUS
  Filled 2016-10-22: qty 2

## 2016-10-22 MED ORDER — MORPHINE SULFATE (PF) 4 MG/ML IV SOLN
4.0000 mg | Freq: Once | INTRAVENOUS | Status: AC
  Administered 2016-10-22: 4 mg via INTRAVENOUS
  Filled 2016-10-22: qty 1

## 2016-10-22 MED ORDER — KETOROLAC TROMETHAMINE 30 MG/ML IJ SOLN
30.0000 mg | Freq: Once | INTRAMUSCULAR | Status: AC
  Administered 2016-10-22: 30 mg via INTRAVENOUS
  Filled 2016-10-22: qty 1

## 2016-10-22 NOTE — ED Triage Notes (Addendum)
Pt reports flank pain and vomiting beginning around three hours ago.  Reports yellow vomit twice but no diarrhea or constipation or fever.  Describes flank pain as sharp in left hip/abdomen that is unrelieved.  Reports hx kidney stones and says 'this feels similar'.

## 2016-10-22 NOTE — ED Provider Notes (Signed)
[facility]-EMERGENCY DEPT Provider Note   CSN: [id number] Arrival date & time: 10/22/16  2018 By signing my name below, I, Linus GalasMaharshi Patel, attest that this documentation has been prepared under the direction and in the presence of TRW AutomotiveKelly Byard Carranza, PA-C. Electronically Signed: Linus GalasMaharshi Patel, ED Scribe. 10/22/16. 10:45 PM.   History   Chief Complaint Chief Complaint  Patient presents with  . Emesis  . Flank Pain   The history is provided by the patient. No language interpreter was used.    HPI Comments: Catherine Patrick is a 50 y.o. female who presents to the Emergency Department with a PMHx of kidney stones complaining of a sudden onset of left-sided flank pain that began 5 hours ago, prior to arrival. She states her pain radiates to her left abdomen. Pt also reports one episode of emesis. Pt denies any aggravating or alleviating factors. Pt has not taken OTC medication for her pain. Pt denies any hematuria, dysuria, or any other symptoms at this time  Pt had an abdominal hernia repair and caesarian section.   Past Medical History:  Diagnosis Date  . Kidney stones    Patient Active Problem List   Diagnosis Date Noted  . Cervical radiculopathy 09/19/2016  . Left arm pain 09/19/2016  . Osteoarthritis of knee 09/30/2015  . Left lateral ankle pain 09/30/2015  . Plantar fasciitis of left foot 12/18/2013  . 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 Hydronephrosis, right 05/30/2013  . Nephrolithiasis 05/30/2013   Past Surgical History:  Procedure Laterality Date  . CESAREAN SECTION    . CESAREAN SECTION    . HERNIA REPAIR     OB History    Gravida Para Term Preterm AB Living             3   SAB TAB Ectopic Multiple Live Births                 Home Medications    Prior to Admission medications   Medication Sig Start Date End Date Taking? Authorizing Provider  Cholecalciferol (VITAMIN D PO) Take 1 capsule by mouth daily.   Yes Historical  Provider, MD  ibuprofen (ADVIL,MOTRIN) 200 MG tablet Take 400 mg by mouth every 6 (six) hours as needed for headache, mild pain or moderate pain.   Yes Historical Provider, MD  meloxicam (MOBIC) 7.5 MG tablet Take 1-2 tabs daily 03/08/16  Yes Guinevere ScarletBlake Williams, MD  acetaminophen-codeine (TYLENOL #3) 300-30 MG tablet Take 1-2 tablets by mouth at bedtime as needed for moderate pain. Patient not taking: Reported on 10/22/2016 09/19/16   Dessa PhiJosalyn Funches, MD  methylPREDNISolone (MEDROL DOSEPAK) 4 MG TBPK tablet Taper per packet insert Patient not taking: Reported on 10/22/2016 09/19/16   Dessa PhiJosalyn Funches, MD  oxyCODONE-acetaminophen (PERCOCET/ROXICET) 5-325 MG tablet Take 1-2 tablets by mouth every 6 (six) hours as needed for severe pain. 10/23/16   Antony MaduraKelly Ulyana Pitones, PA-C  promethazine (PHENERGAN) 25 MG tablet Take 1 tablet (25 mg total) by mouth every 6 (six) hours as needed for nausea or vomiting. 10/23/16   Antony MaduraKelly Erionna Strum, PA-C  tamsulosin (FLOMAX) 0.4 MG CAPS capsule Take 1 capsule (0.4 mg total) by mouth daily. 10/23/16   Antony MaduraKelly Aveyah Greenwood, PA-C  Vitamin D, Ergocalciferol, (DRISDOL) 50000 UNITS CAPS capsule Take 1 capsule (50,000 Units total) by mouth every 7 (seven) days. Patient not taking: Reported on 10/22/2016 04/02/15   Doris Cheadleeepak Advani, MD   Family History Family History  Problem Relation Age of Onset  . Cancer Neg  Hx   . Diabetes Neg Hx   . Heart failure Neg Hx   . Hyperlipidemia Neg Hx   . Hypertension Neg Hx    Social History Social History  Substance Use Topics  . Smoking status: Never Smoker  . Smokeless tobacco: Never Used  . Alcohol use No   Allergies   Patient has no known allergies.  Review of Systems Review of Systems A complete 10 system review of systems was obtained and all systems are negative except as noted in the HPI and PMH.    Physical Exam Updated Vital Signs BP 97/67 (BP Location: Left Arm)   Pulse 77   Temp 98.9 F (37.2 C) (Oral)   Resp 14   Ht 4\' 8"  (1.422 m)   Wt  74.4 kg   LMP 03/19/2014   SpO2 98%   BMI 36.77 kg/m   Physical Exam  Constitutional: She is oriented to person, place, and time. She appears well-developed and well-nourished. No distress.  Nontoxic appearing. In NAD.  HENT:  Head: Normocephalic and atraumatic.  Eyes: Conjunctivae and EOM are normal. No scleral icterus.  Neck: Normal range of motion.  Cardiovascular: Normal rate, regular rhythm and intact distal pulses.   Pulmonary/Chest: Effort normal. No respiratory distress. She has no wheezes. She has no rales.  Respirations even and unlabored  Abdominal: Soft. She exhibits no distension. There is tenderness. There is no rebound and no guarding.  Soft, nondistended abdomen. There is TTP in the LLQ and left mid abdomen. No peritoneal signs.  Musculoskeletal: Normal range of motion.  Neurological: She is alert and oriented to person, place, and time. She exhibits normal muscle tone. Coordination normal.  Skin: Skin is warm and dry. No rash noted. She is not diaphoretic. No erythema. No pallor.  Psychiatric: She has a normal mood and affect. Her behavior is normal.  Nursing note and vitals reviewed.   ED Treatments / Results  DIAGNOSTIC STUDIES: Oxygen Saturation is 99% on room air, normal by my interpretation.    COORDINATION OF CARE: 10:50 PM Discussed treatment plan with pt at bedside and pt agreed to plan.  Labs (all labs ordered are listed, but only abnormal results are displayed) Labs Reviewed  COMPREHENSIVE METABOLIC PANEL - Abnormal; Notable for the following:       Result Value   Glucose, Bld 115 (*)    BUN 21 (*)    All other components within normal limits  URINALYSIS, ROUTINE W REFLEX MICROSCOPIC (NOT AT [facility]) - Abnormal; Notable for the following:    Hgb urine dipstick LARGE (*)    All other components within normal limits  URINE MICROSCOPIC-ADD ON - Abnormal; Notable for the following:    Bacteria, UA RARE (*)    All other components within normal limits    LIPASE, BLOOD  CBC  PREGNANCY, URINE   EKG  EKG Interpretation None      Radiology Ct Renal Stone Study  Result Date: 10/22/2016 CLINICAL DATA:  Acute onset of left flank pain.  Initial encounter. EXAM: CT ABDOMEN AND PELVIS WITHOUT CONTRAST TECHNIQUE: Multidetector CT imaging of the abdomen and pelvis was performed following the standard protocol without IV contrast. COMPARISON:  CT of the abdomen and pelvis performed 05/28/2013 FINDINGS: Lower chest: Minimal bibasilar atelectasis is noted. The visualized portions of the mediastinum are unremarkable. Hepatobiliary: A large 5.3 cm cyst is noted at the left hepatic lobe. The liver is otherwise unremarkable. The gallbladder is within normal limits. The common bile duct remains  normal in caliber. Pancreas: The pancreas is within normal limits. Spleen: The spleen is unremarkable in appearance. Adrenals/Urinary Tract: The adrenal glands are unremarkable in appearance. A 5 x 4 mm obstructing stone is noted at the proximal left ureter, 5 cm below the left renal pelvis. There is minimal left-sided hydronephrosis. Scattered nonobstructing bilateral renal stones are seen, measuring up to 5 mm in size. No perinephric stranding is seen. Stomach/Bowel: The stomach is unremarkable in appearance. The small bowel is within normal limits. The appendix is normal in caliber, without evidence of appendicitis. The colon is unremarkable in appearance. Scattered diverticulosis is noted along the entirety of the colon, without evidence of diverticulitis. Vascular/Lymphatic: The abdominal aorta is unremarkable in appearance. The inferior vena cava is grossly unremarkable. No retroperitoneal lymphadenopathy is seen. No pelvic sidewall lymphadenopathy is identified. Reproductive: The bladder is mildly distended and grossly unremarkable. The uterus is unremarkable in appearance. The ovaries are relatively symmetric. No suspicious adnexal masses are seen. Other: No additional  soft tissue abnormalities are seen. Musculoskeletal: No acute osseous abnormalities are identified. The visualized musculature is unremarkable in appearance. IMPRESSION: 1. 5 x 4 mm obstructing stone in the proximal left ureter, 5 cm below the left renal pelvis, with minimal left-sided hydronephrosis. 2. Nonobstructing bilateral renal stones, measuring up to 5 mm in size. 3. Large hepatic cyst noted. 4. Scattered diverticulosis along the entirety of the colon, without evidence of diverticulitis. Electronically Signed   By: Roanna RaiderJeffery  Chang M.D.   On: 10/22/2016 23:24    Procedures Procedures (including critical care time)  Medications Ordered in ED Medications  ketorolac (TORADOL) 30 MG/ML injection 30 mg (30 mg Intravenous Given 10/22/16 2246)  ondansetron (ZOFRAN) injection 4 mg (4 mg Intravenous Given 10/22/16 2246)  morphine 4 MG/ML injection 4 mg (4 mg Intravenous Given 10/22/16 2246)  oxyCODONE-acetaminophen (PERCOCET/ROXICET) 5-325 MG per tablet 2 tablet (2 tablets Oral Given 10/23/16 0158)   Initial Impression / Assessment and Plan / ED Course  I have reviewed the triage vital signs and the nursing notes.  Pertinent labs & imaging results that were available during my care of the patient were reviewed by me and considered in my medical decision making (see chart for details).  Clinical Course     Pt has been diagnosed with a kidney stone via CT. There is no evidence of significant hydronephrosis, serum creatine WNL, vitals sign stable and the pt does not have irratractable vomiting. Pt will be discharged home with pain medications and has been advised to follow up with urology. Return precautions discussed and provided. Patient discharged in stable condition with no unaddressed concerns.   Final Clinical Impressions(s) / ED Diagnoses   Final diagnoses:  Kidney stone   New Prescriptions New Prescriptions   OXYCODONE-ACETAMINOPHEN (PERCOCET/ROXICET) 5-325 MG TABLET    Take 1-2  tablets by mouth every 6 (six) hours as needed for severe pain.   PROMETHAZINE (PHENERGAN) 25 MG TABLET    Take 1 tablet (25 mg total) by mouth every 6 (six) hours as needed for nausea or vomiting.   TAMSULOSIN (FLOMAX) 0.4 MG CAPS CAPSULE    Take 1 capsule (0.4 mg total) by mouth daily.    I personally performed the services described in this documentation, which was scribed in my presence. The recorded information has been reviewed and is accurate.       Antony MaduraKelly Toni Demo, PA-C 10/23/16 [id number]    Alvira MondayErin Schlossman, MD 10/24/16 2117

## 2016-10-23 LAB — PREGNANCY, URINE: PREG TEST UR: NEGATIVE

## 2016-10-23 LAB — URINALYSIS, ROUTINE W REFLEX MICROSCOPIC
BILIRUBIN URINE: NEGATIVE
Glucose, UA: NEGATIVE mg/dL
KETONES UR: NEGATIVE mg/dL
LEUKOCYTES UA: NEGATIVE
NITRITE: NEGATIVE
PROTEIN: NEGATIVE mg/dL
Specific Gravity, Urine: 1.008 (ref 1.005–1.030)
pH: 6.5 (ref 5.0–8.0)

## 2016-10-23 LAB — URINE MICROSCOPIC-ADD ON: Squamous Epithelial / LPF: NONE SEEN

## 2016-10-23 MED ORDER — TAMSULOSIN HCL 0.4 MG PO CAPS: 0.4000 mg | ORAL_CAPSULE | Freq: Every day | ORAL | 0 refills | Status: DC

## 2016-10-23 MED ORDER — OXYCODONE-ACETAMINOPHEN 5-325 MG PO TABS: 1.0000 | ORAL_TABLET | Freq: Four times a day (QID) | ORAL | 0 refills | Status: DC | PRN

## 2016-10-23 MED ORDER — OXYCODONE-ACETAMINOPHEN 5-325 MG PO TABS
2.0000 | ORAL_TABLET | Freq: Once | ORAL | Status: AC
  Administered 2016-10-23: 2 via ORAL
  Filled 2016-10-23: qty 2

## 2016-10-23 MED ORDER — PROMETHAZINE HCL 25 MG PO TABS: 25.0000 mg | ORAL_TABLET | Freq: Four times a day (QID) | ORAL | 0 refills | Status: DC | PRN

## 2016-10-23 NOTE — Discharge Instructions (Signed)
Your pain is due to a kidney stone. This stone is small enough that it should pass on its own. We recommend that you take Flomax as prescribed to promote stone movement. You may take Percocet as prescribed for pain control. You have been prescribed Phenergan to use for nausea/vomiting. We advise you to call a urologist on Monday to schedule an appointment for follow-up. You may return for new or concerning symptoms.

## 2016-10-23 NOTE — ED Notes (Signed)
Lab call HCG processing now

## 2016-10-24 ENCOUNTER — Telehealth: Payer: Self-pay | Admitting: Family Medicine

## 2016-10-24 DIAGNOSIS — N200 Calculus of kidney: Secondary | ICD-10-CM

## 2016-10-24 NOTE — Telephone Encounter (Signed)
Referral placed for kidney stones

## 2016-10-24 NOTE — Telephone Encounter (Signed)
Patient was seen in ER and is needing referral to urology. Patient has kidney stones. Please follow up.

## 2016-10-26 ENCOUNTER — Ambulatory Visit: Payer: Medicaid Other | Attending: Internal Medicine | Admitting: Physician Assistant

## 2016-10-26 ENCOUNTER — Telehealth: Payer: Self-pay | Admitting: Family Medicine

## 2016-10-26 ENCOUNTER — Encounter: Payer: Self-pay | Admitting: Physician Assistant

## 2016-10-26 VITALS — BP 141/96 | HR 69 | Temp 97.7°F | Resp 16 | Wt 172.2 lb

## 2016-10-26 DIAGNOSIS — N132 Hydronephrosis with renal and ureteral calculous obstruction: Secondary | ICD-10-CM | POA: Diagnosis not present

## 2016-10-26 DIAGNOSIS — M5412 Radiculopathy, cervical region: Secondary | ICD-10-CM

## 2016-10-26 DIAGNOSIS — M170 Bilateral primary osteoarthritis of knee: Secondary | ICD-10-CM

## 2016-10-26 DIAGNOSIS — N200 Calculus of kidney: Secondary | ICD-10-CM | POA: Insufficient documentation

## 2016-10-26 DIAGNOSIS — I10 Essential (primary) hypertension: Secondary | ICD-10-CM | POA: Insufficient documentation

## 2016-10-26 MED ORDER — MELOXICAM 7.5 MG PO TABS: ORAL_TABLET | ORAL | 2 refills | Status: DC

## 2016-10-26 MED ORDER — ACETAMINOPHEN-CODEINE #3 300-30 MG PO TABS: 1.0000 | ORAL_TABLET | Freq: Every evening | ORAL | 0 refills | Status: DC | PRN

## 2016-10-26 MED FILL — MELOXICAM 7.5 MG TABLET: 7.5 | 30 days supply | Qty: 60 | Fill #0

## 2016-10-26 NOTE — Telephone Encounter (Signed)
Patient was confused with RX but then understood Rx Said do not send message to dr anymore.

## 2016-10-26 NOTE — Progress Notes (Signed)
Chief Complaint: "ED follow up"  Subjective: A 50 year old female who has a history of nephrolithiasis. Her first stone was in 2014. This passed on its own. She's been doing well until recently where she developed left flank pain and left abdominal pain with vomiting leading to an emergency department visit on 10/22/2016. She denied urinary symptoms such as hematuria, dysuria, or frequency. She was treated in-house with Toradol, Zofran, morphine and Percocet. A CT scan of the renal arteries showed a 5 x 4 mm obstructing stone of the proximal left ureter with minimal left hydronephrosis. She was given a prescription for Flomax, Phenergan and Percocet.  She continues to have some pain but she is much much improved. Nausea has improved. No fever or chills.   ROS:  GEN: denies fever or chills, denies change in weight Skin: denies lesions or rashes HEENT: denies headache, earache, epistaxis, sore throat, or neck pain LUNGS: denies SHOB, dyspnea, PND, orthopnea CV: denies CP or palpitations ABD:+ abd pain, + N no V EXT: denies muscle spasms or swelling; no pain in lower ext, no weakness NEURO: denies numbness or tingling, denies sz, stroke or TIA   Objective:  Vitals:   10/26/16 0910  BP: (!) 141/96  Pulse: 69  Resp: 16  Temp: 97.7 F (36.5 C)  TempSrc: Oral  SpO2: 96%  Weight: 172 lb 3.2 oz (78.1 kg)    Physical Exam:  General: in no acute distress. Heart: Normal  s1 &s2  Regular rate and rhythm, without murmurs, rubs, gallops. Lungs: Clear to auscultation bilaterally. Abdomen: Soft, nontender, nondistended, positive bowel sounds.  Pertinent Lab Results:NONE   Medications: Prior to Admission medications   Medication Sig Start Date End Date Taking? Authorizing Provider  meloxicam (MOBIC) 7.5 MG tablet Take 1-2 tabs daily 10/26/16  Yes Aksel Bencomo Netta CedarsS Bonnie Roig, PA-C  oxyCODONE-acetaminophen (PERCOCET/ROXICET) 5-325 MG tablet Take 1-2 tablets by mouth every 6 (six) hours as needed for  severe pain. 10/23/16  Yes Antony MaduraKelly Humes, PA-C  acetaminophen-codeine (TYLENOL #3) 300-30 MG tablet Take 1-2 tablets by mouth at bedtime as needed for moderate pain. 10/26/16   Vivianne Masteriffany S Meklit Cotta, PA-C  Cholecalciferol (VITAMIN D PO) Take 1 capsule by mouth daily.    Historical Provider, MD  ibuprofen (ADVIL,MOTRIN) 200 MG tablet Take 400 mg by mouth every 6 (six) hours as needed for headache, mild pain or moderate pain.    Historical Provider, MD  methylPREDNISolone (MEDROL DOSEPAK) 4 MG TBPK tablet Taper per packet insert Patient not taking: Reported on 10/26/2016 09/19/16   Dessa PhiJosalyn Funches, MD  promethazine (PHENERGAN) 25 MG tablet Take 1 tablet (25 mg total) by mouth every 6 (six) hours as needed for nausea or vomiting. Patient not taking: Reported on 10/26/2016 10/23/16   Antony MaduraKelly Humes, PA-C  tamsulosin (FLOMAX) 0.4 MG CAPS capsule Take 1 capsule (0.4 mg total) by mouth daily. Patient not taking: Reported on 10/26/2016 10/23/16   Antony MaduraKelly Humes, PA-C  Vitamin D, Ergocalciferol, (DRISDOL) 50000 UNITS CAPS capsule Take 1 capsule (50,000 Units total) by mouth every 7 (seven) days. Patient not taking: Reported on 10/26/2016 04/02/15   Doris Cheadleeepak Advani, MD    Assessment: 1. Nephrolithiasis 2. Elevated BP   Plan: Referral to Urology PRN pain meds; Meloxican and Tylenol #3 refill provided Avoid triggers DASH diet Recheck BP in 2 weeks once pain has subsided  Follow up:2 weeks  The patient was given clear instructions to go to ER or return to medical center if symptoms don't improve, worsen or new problems develop. The patient  verbalized understanding. The patient was told to call to get lab results if they haven't heard anything in the next week.   This note has been created with Education officer, [employer]Dragon speech recognition software and smart phrase technology. Any transcriptional errors are unintentional.   Scot Juniffany Hollyanne Schloesser, PA-C 10/26/2016, 9:24 AM

## 2016-10-26 NOTE — Progress Notes (Signed)
Pt is in the office today for ED follow up Pt states her pain level today in the office is a 0 Pt states when her kidney stones move she is in a lot of pain

## 2016-10-31 ENCOUNTER — Ambulatory Visit (INDEPENDENT_AMBULATORY_CARE_PROVIDER_SITE_OTHER): Payer: Self-pay

## 2016-10-31 ENCOUNTER — Ambulatory Visit (INDEPENDENT_AMBULATORY_CARE_PROVIDER_SITE_OTHER): Payer: Self-pay | Admitting: Sports Medicine

## 2016-10-31 ENCOUNTER — Encounter: Payer: Self-pay | Admitting: Sports Medicine

## 2016-10-31 ENCOUNTER — Encounter (INDEPENDENT_AMBULATORY_CARE_PROVIDER_SITE_OTHER): Payer: Self-pay | Admitting: Orthopaedic Surgery

## 2016-10-31 ENCOUNTER — Ambulatory Visit (INDEPENDENT_AMBULATORY_CARE_PROVIDER_SITE_OTHER): Payer: Self-pay | Admitting: Orthopaedic Surgery

## 2016-10-31 VITALS — BP 161/91 | Ht <= 58 in | Wt 167.0 lb

## 2016-10-31 DIAGNOSIS — M1711 Unilateral primary osteoarthritis, right knee: Secondary | ICD-10-CM

## 2016-10-31 DIAGNOSIS — M170 Bilateral primary osteoarthritis of knee: Secondary | ICD-10-CM

## 2016-10-31 DIAGNOSIS — M1712 Unilateral primary osteoarthritis, left knee: Secondary | ICD-10-CM

## 2016-10-31 NOTE — Progress Notes (Signed)
  Catherine Patrick - 50 y.o. female MRN [id number]  Date of birth: 01/12/1966  SUBJECTIVE:  Including CC & ROS.  Chief complaint: Bilateral knee pain  Catherine AlimentSulwa Hynson is a 50yo female with hx of OA presenting for follow up of bilateral knee pain. Pt reports that since [facility] that the knee pain has persisted. The bilateral knee pain is worse in right knee compared to the left. She reports pain is constant and rates it at 8/10 for right knee and 4/10 for left knee. She reports that she has taken mobic daily. She has not continued recommended exercises due to pain and has stopped wearing the compression sleeve due to feeling it did not work. Pt has had multiple intra-articular steroid injections with some relief but the pain returns. Her last injection was 3 months ago. Pt reports that she last saw her orthopedic surgeon in July and knee replacement surgery was discussed but at that time the physician wanted to try other methods.   ROS: No rashes No fever No trouble walking    HISTORY: Past Medical, Surgical, Social, and Family History Reviewed & Updated per EMR.   Pertinent Historical Findings include: PMSHx -  Kidney stones, OA  Medications -Vitamin D, Mobic 7.5mg ,   DATA REVIEWED: Bilateral Knee xrays from 09/29/2015 reviewed   PHYSICAL EXAM:  VS: BP:(!) 161/91  HR: bpm  TEMP: ( )  RESP:   HT:4\' 8"  (142.2 cm)   WT:167 lb (75.8 kg)  BMI:37.5 PHYSICAL EXAM: Gen: NAD, alert, cooperative with exam, well-appearing Skin: no rashes, normal turgor  Left Knee: No erythema or obvious bony abnormalities. Trace effusion present  No warmth, joint line tenderness, patellar tenderness Moderate condyle tenderness. ROM 0-120 in flexion and extension and lower leg rotation. Ligaments with solid consistent endpoints including ACL, PCL, LCL, MCL. Non painful patellar compression. Patellar and quadriceps tendons unremarkable. Hamstring and quadriceps strength is normal.  Right  Knee: Small effusion. No warmth, mild medial joint line tenderness, patellar tenderness Mild Condyle tenderness. ROM  0-110 flexion and extension and lower leg rotation. Ligaments with solid consistent endpoints including ACL, PCL, LCL, MCL. Non painful patellar compression. Patellar and quadriceps tendons unremarkable. Hamstring and quadriceps strength is normal.   ASSESSMENT & PLAN:   Bilateral knee OA: -[facility]  -Recommend continue Mobic daily and exercise as tolerated  Patient seen and evaluated with medical student. I agree with the above plan of care. X-rays of her right knee from 2016 show bone-on-bone medial compartmental DJD on the 30 flexion view. She has failed conservative treatment to date including multiple cortisone and Visco supplementation injections. Although she is obese, her BMI is not quite 40. In addition, she is fairly healthy. Although she is young, I think that definitive treatment in the form of either a total knee arthroplasty or a unicompartmental replacement should be discussed with an orthopedist. She is currently without private insurance so I will arrange for her to see Dr[facility]Xu at [facility] to discuss further.

## 2016-10-31 NOTE — Patient Instructions (Signed)
[facility] Dr Roda ShuttersXu Rockney Ghee(Choo) Today, Monday 10/31/16 at 3:45pm [address], [place], [place] [address] Phone: [phone]

## 2016-10-31 NOTE — Progress Notes (Signed)
Office Visit Note   Patient: Catherine Patrick           Date of Birth: 05/28/1966           MRN: [id number] Visit Date: 10/31/2016              Requested by: Dessa PhiJosalyn Funches, MD [address] [place], [place] [address] PCP: Lora PaulaFUNCHES, JOSALYN C, MD   Assessment & Plan: Visit Diagnoses:  1. Unilateral primary osteoarthritis, right knee   2. Unilateral primary osteoarthritis, left knee     Plan: X-rays were reviewed with the patient. At this point she would like to proceed with a right total knee replacement. She understands the risks benefits alternatives to surgery including a 3 month recovery period. She understands the risk of DVT. She denies any previous history of DVT. She would like to have the surgery done as soon as possible.  Follow-Up Instructions: No Follow-up on file.   Orders:  Orders Placed This Encounter  Procedures  . XR KNEE 3 VIEW LEFT  . XR KNEE 3 VIEW RIGHT   No orders of the defined types were placed in this encounter.     Procedures: No procedures performed   Clinical Data: No additional findings.   Subjective: Chief Complaint  Patient presents with  . Right Knee - Pain  . Left Knee - Pain    HPI Patient is a 50 year old female who comes in with chronic bilateral knee pain for the last 2 years worse on the right. She has constant pain that is worse with movement and walking. She's tried conservative treatments with injections and oral medicines. And failed to provide significant relief. Pain does not radiate. She does have pain throughout her knees bilaterally worse on the right. It's a chronic aching throbbing pain. Review of Systems  Constitutional: Negative.   HENT: Negative.   Eyes: Negative.   Respiratory: Negative.   Cardiovascular: Negative.   Endocrine: Negative.   Musculoskeletal: Negative.   Neurological: Negative.   Hematological: Negative.   Psychiatric/Behavioral: Negative.   All other systems reviewed and are  negative.  Negative except for history of present illness.  Objective: Vital Signs: LMP 03/19/2014   Physical Exam  Constitutional: She is oriented to person, place, and time. She appears well-developed and well-nourished.  HENT:  Head: Atraumatic.  Eyes: EOM are normal.  Neck: Neck supple.  Cardiovascular: Intact distal pulses.   Pulmonary/Chest: Effort normal.  Abdominal: Soft.  Neurological: She is alert and oriented to person, place, and time.  Skin: Skin is warm. Capillary refill takes less than 2 seconds.  Psychiatric: She has a normal mood and affect. Her behavior is normal. Judgment and thought content normal.  Nursing note and vitals reviewed.   Ortho Exam Exam of bilateral knees shows no effusion. She has good range of motion. She has no flexion contracture. She has no gross deformities. Specialty Comments:  No specialty comments available.  Imaging: No results found.   PMFS History: Patient Active Problem List   Diagnosis Date Noted  . Cervical radiculopathy 09/19/2016  . Left arm pain 09/19/2016  . Unilateral primary osteoarthritis, left knee 09/30/2015  . Left lateral ankle pain 09/30/2015  . Plantar fasciitis of left foot 12/18/2013  . 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 Hydronephrosis, right 05/30/2013  . Nephrolithiasis 05/30/2013   Past Medical History:  Diagnosis Date  . Kidney stones     Family History  Problem Relation Age of Onset  .  Cancer Neg Hx   . Diabetes Neg Hx   . Heart failure Neg Hx   . Hyperlipidemia Neg Hx   . Hypertension Neg Hx     Past Surgical History:  Procedure Laterality Date  . CESAREAN SECTION    . CESAREAN SECTION    . HERNIA REPAIR     Social History   Occupational History  . Not on file.   Social History Main Topics  . Smoking status: Never Smoker  . Smokeless tobacco: Never Used  . Alcohol use No  . Drug use: No  . Sexual activity: Yes    Birth  control/ protection: None

## 2016-11-04 ENCOUNTER — Telehealth (INDEPENDENT_AMBULATORY_CARE_PROVIDER_SITE_OTHER): Payer: Self-pay

## 2016-11-04 NOTE — Telephone Encounter (Signed)
I called patient to discuss scheduling surgery.  Left voice mail for her to return my call.

## 2016-11-07 ENCOUNTER — Inpatient Hospital Stay: Payer: Medicaid Other | Admitting: Family Medicine

## 2016-11-10 ENCOUNTER — Encounter: Payer: Self-pay | Admitting: Family Medicine

## 2016-11-10 ENCOUNTER — Ambulatory Visit: Payer: Medicaid Other | Attending: Family Medicine | Admitting: Family Medicine

## 2016-11-10 VITALS — BP 155/78 | HR 81 | Temp 98.6°F | Ht <= 58 in | Wt 169.4 lb

## 2016-11-10 DIAGNOSIS — N200 Calculus of kidney: Secondary | ICD-10-CM

## 2016-11-10 DIAGNOSIS — Z87442 Personal history of urinary calculi: Secondary | ICD-10-CM | POA: Insufficient documentation

## 2016-11-10 DIAGNOSIS — R030 Elevated blood-pressure reading, without diagnosis of hypertension: Secondary | ICD-10-CM

## 2016-11-10 DIAGNOSIS — I10 Essential (primary) hypertension: Secondary | ICD-10-CM

## 2016-11-10 DIAGNOSIS — Z114 Encounter for screening for human immunodeficiency virus [HIV]: Secondary | ICD-10-CM

## 2016-11-10 DIAGNOSIS — Z23 Encounter for immunization: Secondary | ICD-10-CM

## 2016-11-10 DIAGNOSIS — Z1159 Encounter for screening for other viral diseases: Secondary | ICD-10-CM

## 2016-11-10 HISTORY — DX: Essential (primary) hypertension: I10

## 2016-11-10 LAB — BASIC METABOLIC PANEL
BUN: 23 mg/dL (ref 7–25)
CALCIUM: 9.5 mg/dL (ref 8.6–10.4)
CO2: 29 mmol/L (ref 20–31)
Chloride: 104 mmol/L (ref 98–110)
Creat: 0.68 mg/dL (ref 0.50–1.05)
GLUCOSE: 84 mg/dL (ref 65–99)
Potassium: 4.4 mmol/L (ref 3.5–5.3)
Sodium: 142 mmol/L (ref 135–146)

## 2016-11-10 NOTE — Progress Notes (Signed)
Pt is here today to follow up on HTN.

## 2016-11-10 NOTE — Assessment & Plan Note (Signed)
HTN with persistently elevated BP Stopping mobic Checking BMP Close f/u if BP > 140/90 next week with start treatment planning on Norvasc

## 2016-11-10 NOTE — Progress Notes (Signed)
Subjective:  Patient ID: Catherine Patrick, female    DOB: 02/29/1966  Age: 50 y.o. MRN: [id number]  CC: Hypertension   HPI Catherine AlimentSulwa Guzzi presents for    1. HYPERTENSION: this is a new problem. Patient with elevated BP at outside doctor's offices.   2. Kidney stone: left flank pain stopped 1 week ago. She is taking "Arabic gum" powder mixed with juice twice daily. She is still taking mobic as well once daily. She has been referred to urology but is uninsured and will not be seen anytime soon.    Social History  Substance Use Topics  . Smoking status: Never Smoker  . Smokeless tobacco: Never Used  . Alcohol use No    Outpatient Medications Prior to Visit  Medication Sig Dispense Refill  . acetaminophen-codeine (TYLENOL #3) 300-30 MG tablet Take 1-2 tablets by mouth at bedtime as needed for moderate pain. 60 tablet 0  . Cholecalciferol (VITAMIN D PO) Take 1 capsule by mouth daily.    . meloxicam (MOBIC) 7.5 MG tablet Take 1-2 tabs daily 60 tablet 2  . ibuprofen (ADVIL,MOTRIN) 200 MG tablet Take 400 mg by mouth every 6 (six) hours as needed for headache, mild pain or moderate pain.    Marland Kitchen. oxyCODONE-acetaminophen (PERCOCET/ROXICET) 5-325 MG tablet Take 1-2 tablets by mouth every 6 (six) hours as needed for severe pain. (Patient not taking: Reported on 11/10/2016) 20 tablet 0  . Vitamin D, Ergocalciferol, (DRISDOL) 50000 UNITS CAPS capsule Take 1 capsule (50,000 Units total) by mouth every 7 (seven) days. (Patient not taking: Reported on 11/10/2016) 12 capsule 0   No facility-administered medications prior to visit.     ROS Review of Systems  Constitutional: Negative for chills and fever.  Eyes: Negative for visual disturbance.  Respiratory: Negative for shortness of breath.   Cardiovascular: Negative for chest pain and leg swelling.  Gastrointestinal: Negative for abdominal pain and blood in stool.  Musculoskeletal: Positive for arthralgias (in knees ). Negative for back pain.  Skin:  Negative for rash.  Allergic/Immunologic: Negative for immunocompromised state.  Hematological: Negative for adenopathy. Does not bruise/bleed easily.  Psychiatric/Behavioral: Negative for dysphoric mood and suicidal ideas.    Objective:  BP (!) 155/78 (BP Location: Left Arm, Patient Position: Sitting, Cuff Size: Small)   Pulse 81   Temp 98.6 F (37 C) (Oral)   Ht 4\' 8"  (1.422 m)   Wt 169 lb 6.4 oz (76.8 kg)   LMP 03/19/2014   SpO2 97%   BMI 37.98 kg/m   BP/Weight 11/10/2016 10/31/2016 10/26/2016  Systolic BP 155 161 141  Diastolic BP 78 91 96  Wt. (Lbs) 169.4 167 172.2  BMI 37.98 37.44 38.61    Physical Exam  Constitutional: She is oriented to person, place, and time. She appears well-developed and well-nourished. No distress.  HENT:  Head: Normocephalic and atraumatic.  Cardiovascular: Normal rate, regular rhythm, normal heart sounds and intact distal pulses.   Pulmonary/Chest: Effort normal and breath sounds normal.  Abdominal: There is no CVA tenderness.  Musculoskeletal: She exhibits no edema.  Neurological: She is alert and oriented to person, place, and time.  Skin: Skin is warm and dry. No rash noted.  Psychiatric: She has a normal mood and affect.    Assessment & Plan:  Ledell PeoplesSulwa was seen today for hypertension.  Diagnoses and all orders for this visit:  Elevated blood pressure reading -     Basic Metabolic Panel  Nephrolithiasis -     CT RENAL STONE STUDY; Future  Screening for HIV (human immunodeficiency virus) -     HIV antibody (with reflex)  Need for hepatitis C screening test -     Hepatitis C antibody, reflex  Encounter for immunization -     Flu Vaccine QUAD 36+ mos IM   There are no diagnoses linked to this encounter.  No orders of the defined types were placed in this encounter.   Follow-up: No Follow-up on file.   Dessa PhiJosalyn Janitza Revuelta MD

## 2016-11-10 NOTE — Patient Instructions (Addendum)
Catherine Patrick was seen today for hypertension.  Diagnoses and all orders for this visit:  Elevated blood pressure reading -     Basic Metabolic Panel  Nephrolithiasis -     CT RENAL STONE STUDY; Future  Screening for HIV (human immunodeficiency virus) -     HIV antibody (with reflex)  Need for hepatitis C screening test -     Hepatitis C antibody, reflex  stop meloxicam Take tylenol #3 for pain as needed   Return in one week, December 14 or  15 for BP recheck, wherever this is availability   Dr. Armen PickupFunches

## 2016-11-10 NOTE — Assessment & Plan Note (Signed)
L ureteral stone  Flank pain has resolved  F/u CT renal stone ordered Stop mobic

## 2016-11-11 LAB — HEPATITIS C ANTIBODY: HCV Ab: NEGATIVE

## 2016-11-11 LAB — HIV ANTIBODY (ROUTINE TESTING W REFLEX): HIV: NONREACTIVE

## 2016-11-18 ENCOUNTER — Encounter: Payer: Self-pay | Admitting: Family Medicine

## 2016-11-18 ENCOUNTER — Other Ambulatory Visit (INDEPENDENT_AMBULATORY_CARE_PROVIDER_SITE_OTHER): Payer: Self-pay | Admitting: Orthopaedic Surgery

## 2016-11-18 ENCOUNTER — Ambulatory Visit: Payer: Medicaid Other

## 2016-11-18 ENCOUNTER — Ambulatory Visit: Payer: Medicaid Other | Attending: Family Medicine | Admitting: Family Medicine

## 2016-11-18 ENCOUNTER — Ambulatory Visit (HOSPITAL_COMMUNITY)
Admission: RE | Admit: 2016-11-18 | Discharge: 2016-11-18 | Disposition: A | Discharge status: Home / Self Care | Payer: Medicaid Other | Source: Ambulatory Visit | Attending: Family Medicine | Admitting: Family Medicine

## 2016-11-18 VITALS — BP 140/94 | HR 92 | Temp 97.7°F | Ht <= 58 in | Wt 168.4 lb

## 2016-11-18 DIAGNOSIS — K5730 Diverticulosis of large intestine without perforation or abscess without bleeding: Secondary | ICD-10-CM | POA: Diagnosis not present

## 2016-11-18 DIAGNOSIS — M25562 Pain in left knee: Secondary | ICD-10-CM | POA: Insufficient documentation

## 2016-11-18 DIAGNOSIS — I10 Essential (primary) hypertension: Secondary | ICD-10-CM

## 2016-11-18 DIAGNOSIS — M170 Bilateral primary osteoarthritis of knee: Secondary | ICD-10-CM | POA: Diagnosis not present

## 2016-11-18 DIAGNOSIS — N200 Calculus of kidney: Secondary | ICD-10-CM

## 2016-11-18 DIAGNOSIS — M1711 Unilateral primary osteoarthritis, right knee: Secondary | ICD-10-CM

## 2016-11-18 DIAGNOSIS — G8929 Other chronic pain: Secondary | ICD-10-CM | POA: Insufficient documentation

## 2016-11-18 DIAGNOSIS — R109 Unspecified abdominal pain: Secondary | ICD-10-CM | POA: Diagnosis not present

## 2016-11-18 DIAGNOSIS — Z87442 Personal history of urinary calculi: Secondary | ICD-10-CM | POA: Diagnosis not present

## 2016-11-18 MED ORDER — GLUCOSAMINE-CHONDROITIN 750-600 MG PO TABS: 2.0000 | ORAL_TABLET | Freq: Every day | ORAL | 0 refills | Status: DC

## 2016-11-18 MED ORDER — AMLODIPINE BESYLATE 5 MG PO TABS: 5.0000 mg | ORAL_TABLET | Freq: Every day | ORAL | 5 refills | Status: DC

## 2016-11-18 MED ORDER — TURMERIC 500 MG PO CAPS: 500.0000 mg | ORAL_CAPSULE | Freq: Every day | ORAL | Status: DC

## 2016-11-18 MED ORDER — IBUPROFEN 600 MG PO TABS: 600.0000 mg | ORAL_TABLET | Freq: Three times a day (TID) | ORAL | 3 refills | Status: DC | PRN

## 2016-11-18 MED FILL — AMLODIPINE BESYLATE 5 MG TA: 5 | 30 days supply | Qty: 30 | Fill #0

## 2016-11-18 MED FILL — ?IBUPROFEN 600 MG TABLET: 600 MG | 20 days supply | Qty: 60 | Fill #0

## 2016-11-18 NOTE — Assessment & Plan Note (Signed)
Ibuprofen for pain Add tumeric and glucosamine chondroitin

## 2016-11-18 NOTE — Assessment & Plan Note (Signed)
Non obstructive stones in both kidneys Reassurance

## 2016-11-18 NOTE — Assessment & Plan Note (Signed)
A: HTN P: Start norvasc 5 mg daily  

## 2016-11-18 NOTE — Patient Instructions (Addendum)
Catherine Patrick was seen today for hypertension.  Diagnoses and all orders for this visit:  Unilateral primary osteoarthritis, left knee  Primary osteoarthritis of both knees -     ibuprofen (ADVIL,MOTRIN) 600 MG tablet; Take 1 tablet (600 mg total) by mouth every 8 (eight) hours as needed. -     Turmeric 500 MG CAPS; Take 500 mg by mouth daily. -     Glucosamine-Chondroitin 750-600 MG TABS; Take 2 tablets by mouth daily.  Essential hypertension -     amLODipine (NORVASC) 5 MG tablet; Take 1 tablet (5 mg total) by mouth daily.   F/u in 4 weeks for HTN, BP check  Dr. Armen PickupFunches

## 2016-11-18 NOTE — Progress Notes (Signed)
Subjective:  Patient ID: Catherine Patrick, female    DOB: 12/30/1965  Age: 50 y.o. MRN: [id number]  CC: Hypertension   HPI Catherine AlimentSulwa Santmyer presents for    1. HYPERTENSION: this is a new problem. She has stopped mobic. She continues to have high BP and intermittent HA. She is amenable to treatment.   2. Kidney stone: left flank pain stopped 1 week ago. She is taking "Arabic gum" powder mixed with juice twice daily. She denies recurrent flank pain and hematuria. She had f/u CT renal stone study that revealed a 3 mm renal stone on the R and 5 mm stone on the L.   3. Chronic knee pain: she takes mobic for pain. She reports ibuprofen helps with headache. She is scheduled for R total knee surgery on 11/30/2016.    Social History  Substance Use Topics  . Smoking status: Never Smoker  . Smokeless tobacco: Never Used  . Alcohol use No    Outpatient Medications Prior to Visit  Medication Sig Dispense Refill  . acetaminophen-codeine (TYLENOL #3) 300-30 MG tablet Take 1-2 tablets by mouth at bedtime as needed for moderate pain. 60 tablet 0  . Cholecalciferol (VITAMIN D PO) Take 1 capsule by mouth every evening.     . meloxicam (MOBIC) 7.5 MG tablet Take 1-2 tabs daily (Patient taking differently: Take 15-30 mg by mouth daily as needed for pain. ) 60 tablet 2  . OVER THE COUNTER MEDICATION Apply 1 application topically 4 (four) times daily as needed (for pain.). MOOV CREAM contains Mentha Spicata Topical 5% , Nilgiri Oil Topical 2%, Turpentine Oil Topical 3% and Wintergreen Oil Topical 15% as active ingredients.     No facility-administered medications prior to visit.     ROS Review of Systems  Constitutional: Negative for chills and fever.  Eyes: Negative for visual disturbance.  Respiratory: Negative for shortness of breath.   Cardiovascular: Negative for chest pain and leg swelling.  Gastrointestinal: Negative for abdominal pain and blood in stool.  Genitourinary: Negative for flank pain.    Musculoskeletal: Positive for arthralgias (in knees ). Negative for back pain.  Skin: Negative for rash.  Allergic/Immunologic: Negative for immunocompromised state.  Neurological: Positive for headaches.  Hematological: Negative for adenopathy. Does not bruise/bleed easily.  Psychiatric/Behavioral: Negative for dysphoric mood and suicidal ideas.    Objective:  BP (!) 140/94 (BP Location: Left Arm, Patient Position: Sitting, Cuff Size: Small)   Pulse 92   Temp 97.7 F (36.5 C) (Oral)   Ht 4\' 8"  (1.422 m)   Wt 168 lb 6.4 oz (76.4 kg)   LMP 03/19/2014   SpO2 98%   BMI 37.75 kg/m   BP/Weight 11/18/2016 11/10/2016 10/31/2016  Systolic BP 140 155 161  Diastolic BP 94 78 91  Wt. (Lbs) 168.4 169.4 167  BMI 37.75 37.98 37.44    Physical Exam  Constitutional: She is oriented to person, place, and time. She appears well-developed and well-nourished. No distress.  HENT:  Head: Normocephalic and atraumatic.  Cardiovascular: Normal rate, regular rhythm, normal heart sounds and intact distal pulses.   Pulmonary/Chest: Effort normal and breath sounds normal.  Abdominal: There is no CVA tenderness.  Musculoskeletal: She exhibits no edema.  Neurological: She is alert and oriented to person, place, and time.  Skin: Skin is warm and dry. No rash noted.  Psychiatric: She has a normal mood and affect.    Assessment & Plan:  Ledell PeoplesSulwa was seen today for hypertension.  Diagnoses and all orders for  this visit:  Unilateral primary osteoarthritis, left knee  Primary osteoarthritis of both knees -     ibuprofen (ADVIL,MOTRIN) 600 MG tablet; Take 1 tablet (600 mg total) by mouth every 8 (eight) hours as needed. -     Turmeric 500 MG CAPS; Take 500 mg by mouth daily. -     Glucosamine-Chondroitin 750-600 MG TABS; Take 2 tablets by mouth daily.  Essential hypertension -     amLODipine (NORVASC) 5 MG tablet; Take 1 tablet (5 mg total) by mouth daily.   There are no diagnoses linked to this  encounter.  No orders of the defined types were placed in this encounter.   Follow-up: Return in about 4 weeks (around 12/16/2016) for HTN/BP check .   Dessa PhiJosalyn Francois Elk MD

## 2016-11-22 ENCOUNTER — Encounter (HOSPITAL_COMMUNITY)
Admission: RE | Admit: 2016-11-22 | Discharge: 2016-11-22 | Disposition: A | Discharge status: Home / Self Care | Payer: Medicaid Other | Source: Ambulatory Visit | Attending: Internal Medicine | Admitting: Internal Medicine

## 2016-11-22 ENCOUNTER — Telehealth: Payer: Self-pay | Admitting: Family Medicine

## 2016-11-22 ENCOUNTER — Encounter (HOSPITAL_COMMUNITY): Payer: Self-pay

## 2016-11-22 DIAGNOSIS — M25572 Pain in left ankle and joints of left foot: Secondary | ICD-10-CM | POA: Diagnosis not present

## 2016-11-22 DIAGNOSIS — Z01812 Encounter for preprocedural laboratory examination: Secondary | ICD-10-CM | POA: Diagnosis present

## 2016-11-22 DIAGNOSIS — N200 Calculus of kidney: Secondary | ICD-10-CM | POA: Diagnosis not present

## 2016-11-22 DIAGNOSIS — I10 Essential (primary) hypertension: Secondary | ICD-10-CM | POA: Diagnosis not present

## 2016-11-22 DIAGNOSIS — M79602 Pain in left arm: Secondary | ICD-10-CM | POA: Diagnosis not present

## 2016-11-22 DIAGNOSIS — N1330 Unspecified hydronephrosis: Secondary | ICD-10-CM | POA: Diagnosis not present

## 2016-11-22 DIAGNOSIS — M722 Plantar fascial fibromatosis: Secondary | ICD-10-CM | POA: Insufficient documentation

## 2016-11-22 DIAGNOSIS — M5412 Radiculopathy, cervical region: Secondary | ICD-10-CM | POA: Diagnosis not present

## 2016-11-22 DIAGNOSIS — M170 Bilateral primary osteoarthritis of knee: Secondary | ICD-10-CM | POA: Diagnosis not present

## 2016-11-22 HISTORY — DX: Headache: R51

## 2016-11-22 HISTORY — DX: Unspecified osteoarthritis, unspecified site: M19.90

## 2016-11-22 HISTORY — DX: Personal history of urinary calculi: Z87.442

## 2016-11-22 HISTORY — DX: Personal history of other medical treatment: Z92.89

## 2016-11-22 HISTORY — DX: Headache, unspecified: R51.9

## 2016-11-22 HISTORY — DX: Essential (primary) hypertension: I10

## 2016-11-22 LAB — TYPE AND SCREEN
ABO/RH(D): O POS
Antibody Screen: NEGATIVE

## 2016-11-22 LAB — COMPREHENSIVE METABOLIC PANEL
ALT: 19 U/L (ref 14–54)
ANION GAP: 9 (ref 5–15)
AST: 22 U/L (ref 15–41)
Albumin: 3.8 g/dL (ref 3.5–5.0)
Alkaline Phosphatase: 123 U/L (ref 38–126)
BUN: 11 mg/dL (ref 6–20)
CHLORIDE: 105 mmol/L (ref 101–111)
CO2: 29 mmol/L (ref 22–32)
Calcium: 9.8 mg/dL (ref 8.9–10.3)
Creatinine, Ser: 0.61 mg/dL (ref 0.44–1.00)
GFR calc non Af Amer: >60 mL/min (ref >60)
Glucose, Bld: 74 mg/dL (ref 65–99)
Potassium: 3.6 mmol/L (ref 3.5–5.1)
SODIUM: 143 mmol/L (ref 135–145)
Total Bilirubin: 0.7 mg/dL (ref 0.3–1.2)
Total Protein: 7.4 g/dL (ref 6.5–8.1)

## 2016-11-22 LAB — URINALYSIS, ROUTINE W REFLEX MICROSCOPIC
Bilirubin Urine: NEGATIVE
GLUCOSE, UA: NEGATIVE mg/dL
Hgb urine dipstick: NEGATIVE
Ketones, ur: NEGATIVE mg/dL
Nitrite: NEGATIVE
PH: 7 (ref 5.0–8.0)
PROTEIN: NEGATIVE mg/dL
Specific Gravity, Urine: 1.008 (ref 1.005–1.030)

## 2016-11-22 LAB — ABO/RH: ABO/RH(D): O POS

## 2016-11-22 LAB — CBC WITH DIFFERENTIAL/PLATELET
Basophils Absolute: 0 10*3/uL (ref 0.0–0.1)
Basophils Relative: 0 %
EOS ABS: 0.1 10*3/uL (ref 0.0–0.7)
EOS PCT: 2 %
HCT: 45.9 % (ref 36.0–46.0)
Hemoglobin: 15 g/dL (ref 12.0–15.0)
LYMPHS ABS: 3.7 10*3/uL (ref 0.7–4.0)
Lymphocytes Relative: 49 %
MCH: 29.4 pg (ref 26.0–34.0)
MCHC: 32.7 g/dL (ref 30.0–36.0)
MCV: 90 fL (ref 78.0–100.0)
MONOS PCT: 6 %
Monocytes Absolute: 0.5 10*3/uL (ref 0.1–1.0)
Neutro Abs: 3.4 10*3/uL (ref 1.7–7.7)
Neutrophils Relative %: 43 %
Platelets: 201 10*3/uL (ref 150–400)
RBC: 5.1 MIL/uL (ref 3.87–5.11)
RDW: 13.6 % (ref 11.5–15.5)
WBC: 7.7 10*3/uL (ref 4.0–10.5)

## 2016-11-22 LAB — C-REACTIVE PROTEIN: CRP: 1.1 mg/dL — AB (ref <1.0)

## 2016-11-22 LAB — PROTIME-INR
INR: 1.07
Prothrombin Time: 13.9 seconds (ref 11.4–15.2)

## 2016-11-22 LAB — SEDIMENTATION RATE: SED RATE: 17 mm/h (ref 0–22)

## 2016-11-22 LAB — APTT: aPTT: 29 seconds (ref 24–36)

## 2016-11-22 LAB — SURGICAL PCR SCREEN
MRSA, PCR: NEGATIVE
STAPHYLOCOCCUS AUREUS: NEGATIVE

## 2016-11-22 NOTE — Pre-Procedure Instructions (Signed)
    Catherine Patrick  11/22/2016    Your procedure is scheduled on Wednesday December 27.  [facility] at 6:30 A.M.                Your surgery or procedure is scheduled for  8:30 AM  Call this number if you have problems the morning of surgery: [phone]    Remember:  Do not eat food or drink liquids after midnight Tuesday, December 26  Take these medicines the morning of surgery with A SIP OF WATER: amlodipine (norvasc)  7 days prior to surgery STOP taking any Aspirin, Aleve, Naproxen, Ibuprofen, Motrin, Advil, Goody's, BC's, all herbal medicationsGlucosamine-Chondroitin,Turmeric  , fish oil, and all vitamins.    Do not wear jewelry, make-up or nail polish.  Do not wear lotions, powders, or perfumes, or deodorant.  Do not shave 48 hours prior to surgery.    Do not bring valuables to the hospital.  [facility] is not responsible for any belongings or valuables.  Contacts, dentures or bridgework may not be worn into surgery.  Leave your suitcase in the car.  After surgery it may be brought to your room.  For patients admitted to the hospital, discharge time will be determined by your treatment team.  Please read over the following fact sheets that you were given: [facility]- Preparing For Surgery and Patient Instructions for Mupirocin Application, Incentive Spirometry, Pain Booklet

## 2016-11-22 NOTE — Telephone Encounter (Signed)
Pt. Came into facility requesting to speak with her PCP regarding her amlodipine. Pt. Was giving the medication to lower her BP and she stopped taking it because it does not make her feel good. Pt. Is having Surgery on 12/27. PT. Can be reached at [phone]. Please f/u with pt.

## 2016-11-22 NOTE — Progress Notes (Signed)
Catherine Patrick was started on Amlodipine, she took dose at 2200 and at 0100 she was a waked with heart palpations. Patient denies chest pain lightheadedness, nausea or shortness of breath. Catherine Patrick stropped taking Amlodipine and decided to watch her diet and salt intake, "I'll take with my Dr after surgery."Bp today was 13 /91.  I instructed patient to call her [facility]CP today[facility] and report information.  I asked patient to call me back today or Friday if her PCP changes the  Medication, patient agreed.  Patient was instructed to stop all herbal products including Arabic Gum (for pain) 1 week prior to surgery.

## 2016-11-23 NOTE — Telephone Encounter (Signed)
Attempted to call pt to get more information but there was no answer.

## 2016-11-24 NOTE — Telephone Encounter (Signed)
Please advise patient to take 2.5 mg, 1/2 tablet of amlodipine

## 2016-11-29 MED ORDER — TRANEXAMIC ACID 1000 MG/10ML IV SOLN
1000.0000 mg | INTRAVENOUS | Status: AC
  Administered 2016-11-30: 1000 mg via INTRAVENOUS
  Filled 2016-11-29: qty 10

## 2016-11-29 MED ORDER — CEFAZOLIN SODIUM-DEXTROSE 2-4 GM/100ML-% IV SOLN
2.0000 g | INTRAVENOUS | Status: AC
  Administered 2016-11-30: 2 g via INTRAVENOUS
  Filled 2016-11-29: qty 100

## 2016-11-29 MED ORDER — BUPIVACAINE LIPOSOME 1.3 % IJ SUSP
20.0000 mL | INTRAMUSCULAR | Status: AC
  Administered 2016-11-30: 20 mL
  Filled 2016-11-29: qty 20

## 2016-11-30 ENCOUNTER — Inpatient Hospital Stay (HOSPITAL_COMMUNITY): Payer: Medicaid Other | Admitting: Anesthesiology

## 2016-11-30 ENCOUNTER — Inpatient Hospital Stay (HOSPITAL_COMMUNITY): Payer: Medicaid Other

## 2016-11-30 ENCOUNTER — Inpatient Hospital Stay (HOSPITAL_COMMUNITY): Payer: Medicaid Other | Admitting: Emergency Medicine

## 2016-11-30 ENCOUNTER — Inpatient Hospital Stay (HOSPITAL_COMMUNITY)
Admission: RE | Admit: 2016-11-30 | Discharge: 2016-12-02 | DRG: 470 | Disposition: A | Discharge status: Home Health | Payer: Medicaid Other | Source: Ambulatory Visit | Attending: Orthopaedic Surgery | Admitting: Orthopaedic Surgery

## 2016-11-30 ENCOUNTER — Encounter (HOSPITAL_COMMUNITY)
Admission: RE | Disposition: A | Discharge status: Home Health | Payer: Self-pay | Source: Ambulatory Visit | Attending: Orthopaedic Surgery

## 2016-11-30 ENCOUNTER — Encounter (HOSPITAL_COMMUNITY): Payer: Self-pay | Admitting: *Deleted

## 2016-11-30 DIAGNOSIS — R079 Chest pain, unspecified: Secondary | ICD-10-CM | POA: Diagnosis not present

## 2016-11-30 DIAGNOSIS — I10 Essential (primary) hypertension: Secondary | ICD-10-CM | POA: Diagnosis present

## 2016-11-30 DIAGNOSIS — Z888 Allergy status to other drugs, medicaments and biological substances status: Secondary | ICD-10-CM | POA: Diagnosis not present

## 2016-11-30 DIAGNOSIS — D62 Acute posthemorrhagic anemia: Secondary | ICD-10-CM | POA: Diagnosis not present

## 2016-11-30 DIAGNOSIS — M1711 Unilateral primary osteoarthritis, right knee: Principal | ICD-10-CM

## 2016-11-30 DIAGNOSIS — Z96659 Presence of unspecified artificial knee joint: Secondary | ICD-10-CM

## 2016-11-30 DIAGNOSIS — Z79899 Other long term (current) drug therapy: Secondary | ICD-10-CM | POA: Diagnosis not present

## 2016-11-30 HISTORY — PX: TOTAL KNEE ARTHROPLASTY: SHX125

## 2016-11-30 SURGERY — ARTHROPLASTY, KNEE, TOTAL
Anesthesia: Spinal | Site: Knee | Laterality: Right

## 2016-11-30 MED ORDER — LIDOCAINE 2% (20 MG/ML) 5 ML SYRINGE
INTRAMUSCULAR | Status: AC
  Filled 2016-11-30: qty 5

## 2016-11-30 MED ORDER — PROMETHAZINE HCL 25 MG PO TABS: 25.0000 mg | ORAL_TABLET | Freq: Four times a day (QID) | ORAL | 1 refills | Status: DC | PRN

## 2016-11-30 MED ORDER — POLYETHYLENE GLYCOL 3350 17 G PO PACK: 17.0000 g | PACK | Freq: Every day | ORAL | Status: DC | PRN

## 2016-11-30 MED ORDER — SODIUM CHLORIDE 0.9 % IV SOLN
INTRAVENOUS | Status: DC
  Administered 2016-12-01: 125 mL/h via INTRAVENOUS

## 2016-11-30 MED ORDER — OXYCODONE HCL 5 MG PO TABS
5.0000 mg | ORAL_TABLET | ORAL | Status: DC | PRN
  Administered 2016-12-01 – 2016-12-02 (×7): 10 mg via ORAL
  Filled 2016-11-30 (×8): qty 2

## 2016-11-30 MED ORDER — ONDANSETRON HCL 4 MG PO TABS: 4.0000 mg | ORAL_TABLET | Freq: Three times a day (TID) | ORAL | 0 refills | Status: DC | PRN

## 2016-11-30 MED ORDER — ACETAMINOPHEN 650 MG RE SUPP: 650.0000 mg | Freq: Four times a day (QID) | RECTAL | Status: DC | PRN

## 2016-11-30 MED ORDER — CEFAZOLIN SODIUM-DEXTROSE 2-4 GM/100ML-% IV SOLN
2.0000 g | Freq: Four times a day (QID) | INTRAVENOUS | Status: AC
  Administered 2016-11-30 (×2): 2 g via INTRAVENOUS
  Filled 2016-11-30 (×2): qty 100

## 2016-11-30 MED ORDER — LACTATED RINGERS IV SOLN
INTRAVENOUS | Status: DC
  Administered 2016-11-30: 125 mL/h via INTRAVENOUS

## 2016-11-30 MED ORDER — SORBITOL 70 % SOLN: 30.0000 mL | Freq: Every day | Status: DC | PRN

## 2016-11-30 MED ORDER — SODIUM CHLORIDE 0.9 % IR SOLN
Status: DC | PRN
Start: 2016-11-30 — End: 2016-11-30
  Administered 2016-11-30: 3000 mL

## 2016-11-30 MED ORDER — PHENYLEPHRINE HCL 10 MG/ML IJ SOLN
INTRAMUSCULAR | Status: DC | PRN
  Administered 2016-11-30: 20 ug/min via INTRAVENOUS

## 2016-11-30 MED ORDER — ACETAMINOPHEN 325 MG PO TABS: 650.0000 mg | ORAL_TABLET | Freq: Four times a day (QID) | ORAL | Status: DC | PRN

## 2016-11-30 MED ORDER — KETOROLAC TROMETHAMINE 30 MG/ML IJ SOLN
30.0000 mg | Freq: Four times a day (QID) | INTRAMUSCULAR | Status: AC
  Administered 2016-11-30 – 2016-12-02 (×7): 30 mg via INTRAVENOUS
  Filled 2016-11-30 (×6): qty 1

## 2016-11-30 MED ORDER — PROPOFOL 10 MG/ML IV BOLUS
INTRAVENOUS | Status: AC
  Filled 2016-11-30: qty 40

## 2016-11-30 MED ORDER — METHOCARBAMOL 1000 MG/10ML IJ SOLN: 500.0000 mg | Freq: Four times a day (QID) | INTRAVENOUS | Status: DC | PRN

## 2016-11-30 MED ORDER — ONDANSETRON HCL 4 MG/2ML IJ SOLN
INTRAMUSCULAR | Status: AC
  Filled 2016-11-30: qty 2

## 2016-11-30 MED ORDER — METHOCARBAMOL 750 MG PO TABS: 750.0000 mg | ORAL_TABLET | Freq: Two times a day (BID) | ORAL | 0 refills | Status: DC | PRN

## 2016-11-30 MED ORDER — TRANEXAMIC ACID 1000 MG/10ML IV SOLN
1000.0000 mg | Freq: Once | INTRAVENOUS | Status: AC
  Administered 2016-11-30: 1000 mg via INTRAVENOUS
  Filled 2016-11-30: qty 10

## 2016-11-30 MED ORDER — KETOROLAC TROMETHAMINE 15 MG/ML IJ SOLN: 30.0000 mg | Freq: Four times a day (QID) | INTRAMUSCULAR | Status: AC | PRN

## 2016-11-30 MED ORDER — MIDAZOLAM HCL 5 MG/5ML IJ SOLN
INTRAMUSCULAR | Status: DC | PRN
  Administered 2016-11-30: 1 mg via INTRAVENOUS
  Administered 2016-11-30: 0.5 mg via INTRAVENOUS

## 2016-11-30 MED ORDER — OXYCODONE HCL ER 10 MG PO T12A: 10.0000 mg | EXTENDED_RELEASE_TABLET | Freq: Two times a day (BID) | ORAL | 0 refills | Status: DC

## 2016-11-30 MED ORDER — DIPHENHYDRAMINE HCL 12.5 MG/5ML PO ELIX: 25.0000 mg | ORAL_SOLUTION | ORAL | Status: DC | PRN

## 2016-11-30 MED ORDER — PROMETHAZINE HCL 25 MG/ML IJ SOLN: 6.2500 mg | INTRAMUSCULAR | Status: DC | PRN

## 2016-11-30 MED ORDER — AMLODIPINE BESYLATE 5 MG PO TABS: 5.0000 mg | ORAL_TABLET | Freq: Every day | ORAL | Status: DC

## 2016-11-30 MED ORDER — ONDANSETRON HCL 4 MG/2ML IJ SOLN: 4.0000 mg | Freq: Four times a day (QID) | INTRAMUSCULAR | Status: DC | PRN

## 2016-11-30 MED ORDER — ACETAMINOPHEN 500 MG PO TABS
1000.0000 mg | ORAL_TABLET | Freq: Four times a day (QID) | ORAL | Status: AC
  Administered 2016-11-30 – 2016-12-01 (×4): 1000 mg via ORAL
  Filled 2016-11-30 (×4): qty 2

## 2016-11-30 MED ORDER — CHLORHEXIDINE GLUCONATE 4 % EX LIQD: 60.0000 mL | Freq: Once | CUTANEOUS | Status: DC

## 2016-11-30 MED ORDER — ALUM & MAG HYDROXIDE-SIMETH 200-200-20 MG/5ML PO SUSP
30.0000 mL | ORAL | Status: DC | PRN
  Administered 2016-12-01: 30 mL via ORAL
  Filled 2016-11-30: qty 30

## 2016-11-30 MED ORDER — BUPIVACAINE IN DEXTROSE 0.75-8.25 % IT SOLN
INTRATHECAL | Status: DC | PRN
  Administered 2016-11-30: 1.6 mL via INTRATHECAL

## 2016-11-30 MED ORDER — ASPIRIN EC 325 MG PO TBEC
325.0000 mg | DELAYED_RELEASE_TABLET | Freq: Two times a day (BID) | ORAL | Status: DC
  Administered 2016-12-01 – 2016-12-02 (×3): 325 mg via ORAL
  Filled 2016-11-30 (×3): qty 1

## 2016-11-30 MED ORDER — KETOROLAC TROMETHAMINE 30 MG/ML IJ SOLN
INTRAMUSCULAR | Status: AC
  Filled 2016-11-30: qty 1

## 2016-11-30 MED ORDER — PHENOL 1.4 % MT LIQD: 1.0000 | OROMUCOSAL | Status: DC | PRN

## 2016-11-30 MED ORDER — FENTANYL CITRATE (PF) 100 MCG/2ML IJ SOLN
INTRAMUSCULAR | Status: DC | PRN
  Administered 2016-11-30: 25 ug via INTRAVENOUS
  Administered 2016-11-30: 50 ug via INTRAVENOUS

## 2016-11-30 MED ORDER — METOCLOPRAMIDE HCL 5 MG/ML IJ SOLN: 5.0000 mg | Freq: Three times a day (TID) | INTRAMUSCULAR | Status: DC | PRN

## 2016-11-30 MED ORDER — LACTATED RINGERS IV SOLN
INTRAVENOUS | Status: DC | PRN
  Administered 2016-11-30 (×2): via INTRAVENOUS

## 2016-11-30 MED ORDER — MIDAZOLAM HCL 2 MG/2ML IJ SOLN
INTRAMUSCULAR | Status: AC
  Filled 2016-11-30: qty 2

## 2016-11-30 MED ORDER — LIDOCAINE HCL (CARDIAC) 20 MG/ML IV SOLN
INTRAVENOUS | Status: DC | PRN
  Administered 2016-11-30: 50 mg via INTRAVENOUS

## 2016-11-30 MED ORDER — OXYCODONE HCL ER 10 MG PO T12A
10.0000 mg | EXTENDED_RELEASE_TABLET | Freq: Two times a day (BID) | ORAL | Status: DC
  Administered 2016-11-30 – 2016-12-02 (×5): 10 mg via ORAL
  Filled 2016-11-30 (×5): qty 1

## 2016-11-30 MED ORDER — MEPERIDINE HCL 25 MG/ML IJ SOLN
INTRAMUSCULAR | Status: AC
  Filled 2016-11-30: qty 1

## 2016-11-30 MED ORDER — SENNOSIDES-DOCUSATE SODIUM 8.6-50 MG PO TABS: 1.0000 | ORAL_TABLET | Freq: Every evening | ORAL | 1 refills | Status: DC | PRN

## 2016-11-30 MED ORDER — PHENYLEPHRINE 40 MCG/ML (10ML) SYRINGE FOR IV PUSH (FOR BLOOD PRESSURE SUPPORT)
PREFILLED_SYRINGE | INTRAVENOUS | Status: AC
Start: 2016-11-30 — End: 2016-11-30
  Filled 2016-11-30: qty 10

## 2016-11-30 MED ORDER — 0.9 % SODIUM CHLORIDE (POUR BTL) OPTIME
TOPICAL | Status: DC | PRN
  Administered 2016-11-30: 1000 mL

## 2016-11-30 MED ORDER — METOCLOPRAMIDE HCL 5 MG PO TABS: 5.0000 mg | ORAL_TABLET | Freq: Three times a day (TID) | ORAL | Status: DC | PRN

## 2016-11-30 MED ORDER — ASPIRIN EC 325 MG PO TBEC: 325.0000 mg | DELAYED_RELEASE_TABLET | Freq: Two times a day (BID) | ORAL | 0 refills | Status: DC

## 2016-11-30 MED ORDER — MENTHOL 3 MG MT LOZG: 1.0000 | LOZENGE | OROMUCOSAL | Status: DC | PRN

## 2016-11-30 MED ORDER — MEPERIDINE HCL 25 MG/ML IJ SOLN
6.2500 mg | INTRAMUSCULAR | Status: DC | PRN
  Administered 2016-11-30 (×2): 12.5 mg via INTRAVENOUS

## 2016-11-30 MED ORDER — ONDANSETRON HCL 4 MG PO TABS: 4.0000 mg | ORAL_TABLET | Freq: Four times a day (QID) | ORAL | Status: DC | PRN

## 2016-11-30 MED ORDER — HYDROMORPHONE HCL 1 MG/ML IJ SOLN: 0.2500 mg | INTRAMUSCULAR | Status: DC | PRN

## 2016-11-30 MED ORDER — FENTANYL CITRATE (PF) 100 MCG/2ML IJ SOLN
INTRAMUSCULAR | Status: AC
  Filled 2016-11-30: qty 2

## 2016-11-30 MED ORDER — OXYCODONE HCL 5 MG PO TABS: 5.0000 mg | ORAL_TABLET | ORAL | 0 refills | Status: DC | PRN

## 2016-11-30 MED ORDER — SODIUM CHLORIDE 0.9% FLUSH
INTRAVENOUS | Status: DC | PRN
  Administered 2016-11-30: 40 mL

## 2016-11-30 MED ORDER — MORPHINE SULFATE (PF) 2 MG/ML IV SOLN: 1.0000 mg | INTRAVENOUS | Status: DC | PRN

## 2016-11-30 MED ORDER — DEXAMETHASONE SODIUM PHOSPHATE 10 MG/ML IJ SOLN
10.0000 mg | Freq: Once | INTRAMUSCULAR | Status: AC
  Administered 2016-12-01: 10 mg via INTRAVENOUS
  Filled 2016-11-30: qty 1

## 2016-11-30 MED ORDER — PHENYLEPHRINE HCL 10 MG/ML IJ SOLN
INTRAMUSCULAR | Status: DC | PRN
  Administered 2016-11-30 (×4): 80 ug via INTRAVENOUS

## 2016-11-30 MED ORDER — PROPOFOL 500 MG/50ML IV EMUL
INTRAVENOUS | Status: DC | PRN
  Administered 2016-11-30: 50 ug/kg/min via INTRAVENOUS
  Administered 2016-11-30: 25 ug/kg/min via INTRAVENOUS

## 2016-11-30 MED ORDER — METHOCARBAMOL 500 MG PO TABS
500.0000 mg | ORAL_TABLET | Freq: Four times a day (QID) | ORAL | Status: DC | PRN
  Administered 2016-12-01 – 2016-12-02 (×3): 500 mg via ORAL
  Filled 2016-11-30 (×3): qty 1

## 2016-11-30 MED ORDER — MAGNESIUM CITRATE PO SOLN
1.0000 | Freq: Once | ORAL | Status: DC | PRN
Start: 2016-11-30 — End: 2016-12-02

## 2016-11-30 SURGICAL SUPPLY — 69 items
ALCOHOL ISOPROPYL (RUBBING) (MISCELLANEOUS) ×3 IMPLANT
APL SKNCLS STERI-STRIP NONHPOA (GAUZE/BANDAGES/DRESSINGS) ×1
BAG DECANTER FOR FLEXI CONT (MISCELLANEOUS) ×3 IMPLANT
BANDAGE ACE 6X5 VEL STRL LF (GAUZE/BANDAGES/DRESSINGS) ×6 IMPLANT
BANDAGE ESMARK 6X9 LF (GAUZE/BANDAGES/DRESSINGS) ×1 IMPLANT
BENZOIN TINCTURE PRP APPL 2/3 (GAUZE/BANDAGES/DRESSINGS) ×3 IMPLANT
BLADE SAW SGTL 13.0X1.19X90.0M (BLADE) ×3 IMPLANT
BNDG CMPR 9X6 STRL LF SNTH (GAUZE/BANDAGES/DRESSINGS) ×1
BNDG CMPR MED 10X6 ELC LF (GAUZE/BANDAGES/DRESSINGS) ×1
BNDG ELASTIC 6X10 VLCR STRL LF (GAUZE/BANDAGES/DRESSINGS) ×2 IMPLANT
BNDG ESMARK 6X9 LF (GAUZE/BANDAGES/DRESSINGS) ×3
BOWL SMART MIX CTS (DISPOSABLE) ×3 IMPLANT
CAPT KNEE TRIATH TK-4 ×2 IMPLANT
CLOSURE STERI-STRIP 1/2X4 (GAUZE/BANDAGES/DRESSINGS) ×1
CLSR STERI-STRIP ANTIMIC 1/2X4 (GAUZE/BANDAGES/DRESSINGS) ×3 IMPLANT
COVER SURGICAL LIGHT HANDLE (MISCELLANEOUS) ×3 IMPLANT
CUFF TOURNIQUET SINGLE 34IN LL (TOURNIQUET CUFF) ×3 IMPLANT
CUFF TOURNIQUET SINGLE 44IN (TOURNIQUET CUFF) IMPLANT
DRAPE HALF SHEET 40X57 (DRAPES) ×3 IMPLANT
DRAPE INCISE IOBAN 66X45 STRL (DRAPES) IMPLANT
DRAPE ORTHO SPLIT 77X108 STRL (DRAPES) ×6
DRAPE PROXIMA HALF (DRAPES) ×3 IMPLANT
DRAPE SURG 17X11 SM STRL (DRAPES) ×6 IMPLANT
DRAPE SURG ORHT 6 SPLT 77X108 (DRAPES) ×2 IMPLANT
DRSG AQUACEL AG ADV 3.5X14 (GAUZE/BANDAGES/DRESSINGS) ×3 IMPLANT
DURAPREP 26ML APPLICATOR (WOUND CARE) ×9 IMPLANT
ELECT CAUTERY BLADE 6.4 (BLADE) ×3 IMPLANT
ELECT REM PT RETURN 9FT ADLT (ELECTROSURGICAL) ×3
ELECTRODE REM PT RTRN 9FT ADLT (ELECTROSURGICAL) ×1 IMPLANT
GLOVE SKINSENSE NS SZ7.5 (GLOVE) ×2
GLOVE SKINSENSE STRL SZ7.5 (GLOVE) ×1 IMPLANT
GLOVE SURG SYN 7.5  E (GLOVE) ×8
GLOVE SURG SYN 7.5 E (GLOVE) ×4 IMPLANT
GLOVE SURG SYN 7.5 PF PI (GLOVE) ×4 IMPLANT
GOWN STRL REIN XL XLG (GOWN DISPOSABLE) ×3 IMPLANT
GOWN STRL REUS W/ TWL LRG LVL3 (GOWN DISPOSABLE) ×1 IMPLANT
GOWN STRL REUS W/TWL LRG LVL3 (GOWN DISPOSABLE) ×3
HANDPIECE INTERPULSE COAX TIP (DISPOSABLE) ×3
HOOD PEEL AWAY FLYTE STAYCOOL (MISCELLANEOUS) ×6 IMPLANT
KIT BASIN OR (CUSTOM PROCEDURE TRAY) ×3 IMPLANT
KIT ROOM TURNOVER OR (KITS) ×3 IMPLANT
MANIFOLD NEPTUNE II (INSTRUMENTS) ×3 IMPLANT
NDL SPNL 18GX3.5 QUINCKE PK (NEEDLE) ×1 IMPLANT
NEEDLE SPNL 18GX3.5 QUINCKE PK (NEEDLE) ×3 IMPLANT
NS IRRIG 1000ML POUR BTL (IV SOLUTION) ×3 IMPLANT
PACK TOTAL JOINT (CUSTOM PROCEDURE TRAY) ×3 IMPLANT
PAD ARMBOARD 7.5X6 YLW CONV (MISCELLANEOUS) ×6 IMPLANT
PEN SKIN MARKING BROAD (MISCELLANEOUS) ×3 IMPLANT
SAW OSC TIP CART 19.5X105X1.3 (SAW) ×3 IMPLANT
SEALER BIPOLAR AQUA 6.0 (INSTRUMENTS) ×3 IMPLANT
SET HNDPC FAN SPRY TIP SCT (DISPOSABLE) ×1 IMPLANT
STAPLER VISISTAT 35W (STAPLE) IMPLANT
SUCTION FRAZIER HANDLE 10FR (MISCELLANEOUS) ×2
SUCTION TUBE FRAZIER 10FR DISP (MISCELLANEOUS) ×1 IMPLANT
SUT ETHILON 2 0 FS 18 (SUTURE) IMPLANT
SUT MNCRL AB 4-0 PS2 18 (SUTURE) IMPLANT
SUT VIC AB 0 CT1 27 (SUTURE) ×6
SUT VIC AB 0 CT1 27XBRD ANBCTR (SUTURE) ×2 IMPLANT
SUT VIC AB 1 CT1 27 (SUTURE) ×6
SUT VIC AB 1 CT1 27XBRD ANBCTR (SUTURE) IMPLANT
SUT VIC AB 1 CTX 27 (SUTURE) ×9 IMPLANT
SUT VIC AB 2-0 CT1 27 (SUTURE) ×9
SUT VIC AB 2-0 CT1 TAPERPNT 27 (SUTURE) ×3 IMPLANT
SYR 20CC LL (SYRINGE) ×3 IMPLANT
SYR 50ML LL SCALE MARK (SYRINGE) ×3 IMPLANT
TOWEL OR 17X24 6PK STRL BLUE (TOWEL DISPOSABLE) ×3 IMPLANT
TOWEL OR 17X26 10 PK STRL BLUE (TOWEL DISPOSABLE) ×3 IMPLANT
TRAY CATH 16FR W/PLASTIC CATH (SET/KITS/TRAYS/PACK) IMPLANT
WRAP KNEE MAXI GEL POST OP (GAUZE/BANDAGES/DRESSINGS) ×3 IMPLANT

## 2016-11-30 NOTE — Op Note (Signed)
Total Knee Arthroplasty Procedure Note Catherine Patrick [id number] 11/30/2016   Preoperative diagnosis: Right knee osteoarthritis  Postoperative diagnosis:same  Operative procedure: Right total knee arthroplasty. CPT [phone]  Surgeon: N. Glee ArvinMichael Xu, MD  Assistants: Hart CarwinJustin Queen, RNFA  Anesthesia: Spinal, regional  Tourniquet time: less than 2 hrs  Implants used: [employer]ker Uncemented Triatholon Femur: PS 3 Tibia: 4 Patella: 29 mm, 9 thick Polyethylene: 9 mm  Indication: Catherine Patrick is a 50 y.o. year old female with a history of knee pain. Having failed conservative management, the patient elected to proceed with a total knee arthroplasty.  We have reviewed the risk and benefits of the surgery and they elected to proceed after voicing understanding.  Procedure:  After informed consent was obtained and understanding of the risk were voiced including but not limited to bleeding, infection, damage to surrounding structures including nerves and vessels, blood clots, leg length inequality and the failure to achieve desired results, the operative extremity was marked with verbal confirmation of the patient in the holding area.   The patient was then brought to the operating room and transported to the operating room table in the supine position.  A tourniquet was applied to the operative extremity around the upper thigh. The operative limb was then prepped and draped in the usual sterile fashion and preoperative antibiotics were administered.  A time out was performed prior to the start of surgery confirming the correct extremity, preoperative antibiotic administration, as well as team members, implants and instruments available for the case. Correct surgical site was also confirmed with preoperative radiographs. The limb was then elevated for exsanguination and the tourniquet was inflated. A midline incision was made and a standard medial parapatellar approach was performed.  The patella was  prepared and sized to a 29 mm.  A cover was placed on the patella for protection from retractors.  We then turned our attention to the femur. Posterior cruciate ligament was sacrificed. Start site was drilled in the femur and the intramedullary distal femoral cutting guide was placed, set at 5 degrees valgus, taking 9 mm of distal resection. The distal cut was made. Osteophytes were then removed. Next, the proximal tibial cutting guide was placed with appropriate slope, varus/valgus alignment and depth of resection. The proximal tibial cut was made. Gap blocks were then used to assess the extension gap and alignment, and appropriate soft tissue releases were performed. Attention was turned back to the femur, which was sized using the sizing guide to a size 3. Appropriate rotation of the femoral component was determined using epicondylar axis, Whiteside's line, and assessing the flexion gap under ligament tension. The appropriate size 4-in-1 cutting block was placed and cuts were made. Posterior femoral osteophytes and uncapped bone were then removed with the curved osteotome. The tibia was sized for a size 4 component. The femoral box-cutting guide was placed and prepared for a PS femoral component. Trial components were placed, and stability was checked in full extension, mid-flexion, and deep flexion. Proper tibial rotation was determined and marked.  The patella tracked well without a lateral release. Trial components were then removed and tibial preparation performed. A posterior capsular injection comprising of 20 cc of 1.3% exparel and 40 cc of normal saline was performed for postoperative pain control. The bony surfaces were irrigated with a pulse lavage and then dried. The final components sized above were malleted in. The stability of the construct was re-evaluated throughout a range of motion and found to be acceptable. The trial liner  was removed, the knee was copiously irrigated, and the knee was  re-evaluated for any excess bone debris. The real polyethylene liner, 9 mm thick, was inserted and checked to ensure the locking mechanism had engaged appropriately. The tourniquet was deflated and hemostasis was achieved. The wound was irrigated with dilute betadine in normal saline, and then again with normal saline. A drain was not placed. Capsular closure was performed with a #1 vicryl, subcutaneous fat closed with a 0 vicryl suture, then subcutaneous tissue closed with interrupted 2.0 vicryl suture. The skin was then closed with a 3.0 monocryl. A sterile dressing was applied.  The patient was awakened in the operating room and taken to recovery in stable condition. All sponge, needle, and instrument counts were correct at the end of the case.  Position: supine  Complications: none.  Time Out: performed   Drains/Packing: none  Estimated blood loss: 75 cc  Returned to Recovery Room: in good condition.   Antibiotics: yes   Mechanical VTE (DVT) Prophylaxis: sequential compression devices, TED thigh-high  Chemical VTE (DVT) Prophylaxis: aspirin  Fluid Replacement  Crystalloid: see anesthesia record Blood: none  FFP: none   Specimens Removed: 1 to pathology   Sponge and Instrument Count Correct? yes   PACU: portable radiograph - knee AP and Lateral   Admission: inpatient status  Plan/RTC: Return in 2 weeks for wound check.   Weight Bearing/Load Lower Extremity: full   N. Glee ArvinMichael Xu, MD [facility] [phone] 8:44 AM

## 2016-11-30 NOTE — Progress Notes (Signed)
Stopped by to visit with pt, who had four female family members cheerfully in rm w/ her. Pt and another were praying. The others thanked me for coming by, said they did not now need a copy of the [place] (they had one w/ them), nor a Muslim clergy person called, and they would let me know if there was anything we could do for them. We exchanged blessings. Chaplain available for f/u.    11/30/16 1500  Clinical Encounter Type  Visited With Patient and family together  Visit Type Initial  Referral From Chaplain  Spiritual Encounters  Spiritual Needs Other (Comment) (none identified)  Stress Factors  Patient Stress Factors Health changes  Family Stress Factors Family relationships;Health changes   Catherine Patrick, Chaplain

## 2016-11-30 NOTE — Telephone Encounter (Signed)
Pt was called and a VM was left informing pt to take 2.5 mg of medication. I also instructed pt to call if she has anymore problems with her medication.

## 2016-11-30 NOTE — Anesthesia Procedure Notes (Signed)
Spinal  Patient location during procedure: OR Start time: 11/30/2016 8:43 AM End time: 11/30/2016 8:45 AM Staffing Anesthesiologist: Suella Broad D Performed: anesthesiologist  Preanesthetic Checklist Completed: patient identified, site marked, surgical consent, pre-op evaluation, timeout performed, IV checked, risks and benefits discussed and monitors and equipment checked Spinal Block Patient position: sitting Prep: Betadine Patient monitoring: heart rate, continuous pulse ox, blood pressure and cardiac monitor Approach: midline Location: L4-5 Injection technique: single-shot Needle Needle type: Introducer and Pencan  Needle gauge: 24 G Needle length: 9 cm Additional Notes Negative paresthesia. Negative blood return. Positive free-flowing CSF. Expiration date of kit checked and confirmed. Patient tolerated procedure well, without complications.

## 2016-11-30 NOTE — H&P (Signed)
PREOPERATIVE H&P  Chief Complaint: right knee degenerative joint disease  HPI: Catherine Patrick is a 50 y.o. female who presents for surgical treatment of right knee degenerative joint disease.  She denies any changes in medical history.  Past Medical History:  Diagnosis Date  . DJD (degenerative joint disease)   . Headache   . History of blood transfusion    2 times after child birth  . History of kidney stones    passed one in 2014- 2017  . Hypertension   . Kidney stones    Past Surgical History:  Procedure Laterality Date  . CESAREAN SECTION    . CESAREAN SECTION    . HERNIA REPAIR    . LIPOSUCTION  2002   Social History   Social History  . Marital status: Single    Spouse name: N/A  . Number of children: N/A  . Years of education: N/A   Social History Main Topics  . Smoking status: Never Smoker  . Smokeless tobacco: Never Used  . Alcohol use No  . Drug use: No  . Sexual activity: Yes    Birth control/ protection: None   Other Topics Concern  . None   Social History Narrative  . None   Family History  Problem Relation Age of Onset  . Cancer Neg Hx   . Diabetes Neg Hx   . Heart failure Neg Hx   . Hyperlipidemia Neg Hx   . Hypertension Neg Hx    Allergies  Allergen Reactions  . Amlodipine Palpitations    Took one and Heart Palpations started and then stoppped- took it at 2200 Had palpations at 0100 for a seconds-   Prior to Admission medications   Medication Sig Start Date End Date Taking? Authorizing Provider  acetaminophen-codeine (TYLENOL #3) 300-30 MG tablet Take 1-2 tablets by mouth at bedtime as needed for moderate pain. 10/26/16  Yes Tiffany Netta CedarsS Noel, PA-C  amLODipine (NORVASC) 5 MG tablet Take 1 tablet (5 mg total) by mouth daily. 11/18/16  Yes Josalyn Funches, MD  Cholecalciferol (VITAMIN D PO) Take 1 capsule by mouth every evening.    Yes Historical Provider, MD  ibuprofen (ADVIL,MOTRIN) 600 MG tablet Take 1 tablet (600 mg total) by mouth  every 8 (eight) hours as needed. 11/18/16  Yes Josalyn Funches, MD  OVER THE COUNTER MEDICATION Apply 1 application topically 4 (four) times daily as needed (for pain.). MOOV CREAM contains Mentha Spicata Topical 5% , Nilgiri Oil Topical 2%, Turpentine Oil Topical 3% and Wintergreen Oil Topical 15% as active ingredients.   Yes Historical Provider, MD  Glucosamine-Chondroitin 750-600 MG TABS Take 2 tablets by mouth daily. 11/18/16   Josalyn Funches, MD  Turmeric 500 MG CAPS Take 500 mg by mouth daily. 11/18/16   Josalyn Funches, MD     Positive ROS: All other systems have been reviewed and were otherwise negative with the exception of those mentioned in the HPI and as above.  Physical Exam: General: Alert, no acute distress Cardiovascular: No pedal edema Respiratory: No cyanosis, no use of accessory musculature GI: abdomen soft Skin: No lesions in the area of chief complaint Neurologic: Sensation intact distally Psychiatric: Patient is competent for consent with normal mood and affect Lymphatic: no lymphedema  MUSCULOSKELETAL: exam stable  Assessment: right knee degenerative joint disease  Plan: Plan for Procedure(s): RIGHT TOTAL KNEE ARTHROPLASTY  The risks benefits and alternatives were discussed with the patient including but not limited to the risks of nonoperative treatment, versus surgical  intervention including infection, bleeding, nerve injury,  blood clots, cardiopulmonary complications, morbidity, mortality, among others, and they were willing to proceed.   Glee ArvinMichael Xu, MD   11/30/2016 6:54 AM

## 2016-11-30 NOTE — Progress Notes (Signed)
Orthopedic Tech Progress Note Patient Details:  Catherine Patrick 04/03/1966 [id number]  CPM Right Knee CPM Right Knee: On Right Knee Flexion (Degrees): 90 Right Knee Extension (Degrees): 0 Additional Comments: trapeze bar patient helper   Nikki DomCrawford, Nadia Torr 11/30/2016, 11:45 AM Viewed order from doctor's order list

## 2016-11-30 NOTE — Anesthesia Procedure Notes (Addendum)
Anesthesia Regional Block:  Adductor canal block  Pre-Anesthetic Checklist: ,, timeout performed, Correct Patient, Correct Site, Correct Laterality, Correct Procedure, Correct Position, site marked, Risks and benefits discussed,  Surgical consent,  Pre-op evaluation,  At surgeon's request and post-op pain management  Laterality: Right  Prep: chloraprep       Needles:  Injection technique: Single-shot  Needle Type: Echogenic Needle     Needle Length: 9cm 9 cm Needle Gauge: 21 and 21 G    Additional Needles:  Procedures: ultrasound guided (picture in chart) Adductor canal block Narrative:  Start time: 11/30/2016 8:15 AM End time: 11/30/2016 8:20 AM Injection made incrementally with aspirations every 5 mL.  Performed by: Personally  Anesthesiologist: Shona SimpsonHOLLIS, Ivanell Deshotel D  Additional Notes: No immediate complications noted. Pt tolerated well.

## 2016-11-30 NOTE — Anesthesia Preprocedure Evaluation (Addendum)
Anesthesia Evaluation  Patient identified by MRN, date of birth, ID band Patient awake    Reviewed: Allergy & Precautions, NPO status , Patient's Chart, lab work & pertinent test results  Airway Mallampati: I  TM Distance: >3 FB Neck ROM: Full    Dental  (+) Teeth Intact, Dental Advisory Given   Pulmonary    breath sounds clear to auscultation       Cardiovascular hypertension, Pt. on medications  Rhythm:Regular Rate:Normal     Neuro/Psych  Headaches,  Neuromuscular disease negative psych ROS   GI/Hepatic negative GI ROS, Neg liver ROS,   Endo/Other  negative endocrine ROS  Renal/GU Renal disease  negative genitourinary   Musculoskeletal  (+) Arthritis ,   Abdominal   Peds negative pediatric ROS (+)  Hematology negative hematology ROS (+)   Anesthesia Other Findings   Reproductive/Obstetrics negative OB ROS                            Lab Results  Component Value Date   WBC 7.7 11/22/2016   HGB 15.0 11/22/2016   HCT 45.9 11/22/2016   MCV 90.0 11/22/2016   PLT 201 11/22/2016   Lab Results  Component Value Date   CREATININE 0.61 11/22/2016   BUN 11 11/22/2016   NA 143 11/22/2016   K 3.6 11/22/2016   CL 105 11/22/2016   CO2 29 11/22/2016   Lab Results  Component Value Date   INR 1.07 11/22/2016   EKG: normal sinus rhythm.  Anesthesia Physical Anesthesia Plan  ASA: II  Anesthesia Plan: Spinal   Post-op Pain Management:  Regional for Post-op pain   Induction: Intravenous  Airway Management Planned: Natural Airway  Additional Equipment:   Intra-op Plan:   Post-operative Plan:   Informed Consent: I have reviewed the patients History and Physical, chart, labs and discussed the procedure including the risks, benefits and alternatives for the proposed anesthesia with the patient or authorized representative who has indicated his/her understanding and acceptance.    Dental advisory given  Plan Discussed with: CRNA  Anesthesia Plan Comments:         Anesthesia Quick Evaluation

## 2016-11-30 NOTE — Evaluation (Signed)
Physical Therapy Evaluation Patient Details Name: Catherine Patrick MRN: [id number] DOB: 05/16/1966 Today's Date: 11/30/2016   History of Present Illness  [age] y.o. [facility] for elective R TKA.  Pt with significant PMHx of HTN, and HA.  Clinical Impression  Pt is POD #0 and is moving well.  She was able to walk to the bathroom and back to the chair with min assist and RW.  She will likely progress well enough to d/c home with family's assist and [facility]   PT to follow acutely for deficits listed below.       Follow Up Recommendations Home health PT;Supervision for mobility/OOB    Equipment Recommendations  Rolling walker with 5" wheels    Recommendations for Other Services   NA    Precautions / Restrictions Precautions Precautions: Knee Precaution Booklet Issued: Yes (comment) Precaution Comments: knee exercise handout given and WB status reviewed Restrictions Weight Bearing Restrictions: Yes RLE Weight Bearing: Weight bearing as tolerated      Mobility  Bed Mobility Overal bed mobility: Needs Assistance Bed Mobility: Supine to Sit     Supine to sit: Min assist;HOB elevated     General bed mobility comments: Min assist to help progress right leg to EOB.   Transfers Overall transfer level: Needs assistance Equipment used: Rolling walker (2 wheeled) Transfers: Sit to/from Stand Sit to Stand: Min assist         General transfer comment: Min assist from bed and toilet with assist to support trunk to power up to standing.   Ambulation/Gait Ambulation/Gait assistance: Min assist Ambulation Distance (Feet): 15 Feet Assistive device: Rolling walker (2 wheeled) Gait Pattern/deviations: Step-to pattern;Antalgic     General Gait Details: Pt with moderately antalgic gait pattern. Verbal cues for correct LE sequencing.  Support at trunk for balance and to help support pt when she WBs on right leg.          Balance Overall balance  assessment: Needs assistance Sitting-balance support: Feet supported;No upper extremity supported Sitting balance-Leahy Scale: Good     Standing balance support: Bilateral upper extremity supported Standing balance-Leahy Scale: Fair                               Pertinent Vitals/Pain Pain Assessment: Faces Faces Pain Scale: Hurts little more Pain Location: right knee Pain Descriptors / Indicators: Grimacing;Guarding Pain Intervention(s): Limited activity within patient's tolerance;Monitored during session;Repositioned    Home Living Family/patient expects to be discharged to:: Private residence Living Arrangements: Children;Non-relatives/Friends Available Help at Discharge: Family;Available 24 hours/day Type of Home: House (townhome) Home Access: Stairs to enter Entrance Stairs-Rails: None Secretary/administratorntrance Stairs-Number of Steps: 2 Home Layout: Two level;Bed/bath upstairs Home Equipment: None      Prior Function Level of Independence: Independent               Extremity/TrunkAssessment   Upper Extremity Assessment Upper Extremity Assessment: Defer to OT evaluation    Lower Extremity Assessment Lower Extremity Assessment: RLE deficits/detail RLE Deficits / Details: right leg with normal post op pain and weakness.  Ankle at least 3/5, knee 2/5, hip flexion 3-/5    Cervical / Trunk Assessment Cervical / Trunk Assessment: Normal  Communication   Communication: No difficulties  Cognition Arousal/Alertness: Awake/alert Behavior During Therapy: WFL for tasks assessed/performed Overall Cognitive Status: Within Functional Limits for tasks assessed  Exercises Total Joint Exercises Ankle Circles/Pumps: AROM;Both;20 reps   Assessment/Plan    PT Assessment Patient needs continued PT services  PT Problem List Decreased strength;Decreased range of motion;Decreased activity tolerance;Decreased balance;Decreased mobility;Decreased  knowledge of use of DME;Decreased knowledge of precautions;Pain          PT Treatment Interventions DME instruction;Gait training;Stair training;Functional mobility training;Therapeutic activities;Therapeutic exercise;Balance training;Patient/family education;Manual techniques;Modalities    PT Goals (Current goals can be found in the Care Plan section)  Acute Rehab PT Goals Patient Stated Goal: to go home and get up her flight of stairs at discharge PT Goal Formulation: With patient Time For Goal Achievement: 12/07/16 Potential to Achieve Goals: Good    Frequency 7X/week           End of Session Equipment Utilized During Treatment: Gait belt Activity Tolerance: Patient limited by pain;Patient limited by fatigue Patient left: in chair;with call bell/phone within reach;with family/visitor present Nurse Communication: Mobility status         Time: [id number] PT Time Calculation (min) (ACUTE ONLY): 33 min   Charges:   PT Evaluation $PT Eval Low Complexity: 1 Procedure PT Treatments $Therapeutic Activity: 8-22 mins        Rashawna Scoles B. Moria Brophy, PT, DPT [phone]   11/30/2016, 3:04 PM

## 2016-11-30 NOTE — Discharge Instructions (Signed)

## 2016-11-30 NOTE — Transfer of Care (Signed)
Immediate Anesthesia Transfer of Care Note  Patient: Catherine Patrick  Procedure(s) Performed: Procedure(s): RIGHT TOTAL KNEE ARTHROPLASTY (Right)  Patient Location: PACU  Anesthesia Type:Spinal  Level of Consciousness: awake and alert   Airway & Oxygen Therapy: Patient Spontanous Breathing and Patient connected to nasal cannula oxygen  Post-op Assessment: Report given to RN, Post -op Vital signs reviewed and stable and shivering  Post vital signs: Reviewed and stable  Last Vitals:  Vitals:   11/30/16 0640 11/30/16 0649  BP: (!) 147/92 131/85  Pulse: 87   Resp: 20   Temp: 36.6 C     Last Pain:  Vitals:   11/30/16 0642  TempSrc:   PainSc: 4       Patients Stated Pain Goal: 2 (11/30/16 [id number])  Complications: No apparent anesthesia complications

## 2016-12-01 ENCOUNTER — Encounter (HOSPITAL_COMMUNITY): Payer: Self-pay | Admitting: Orthopaedic Surgery

## 2016-12-01 LAB — COMPREHENSIVE METABOLIC PANEL
ALK PHOS: 86 U/L (ref 38–126)
ALT: 15 U/L (ref 14–54)
ANION GAP: 7 (ref 5–15)
AST: 22 U/L (ref 15–41)
Albumin: 2.8 g/dL — ABNORMAL LOW (ref 3.5–5.0)
BILIRUBIN TOTAL: 0.5 mg/dL (ref 0.3–1.2)
BUN: 12 mg/dL (ref 6–20)
CALCIUM: 8.4 mg/dL — AB (ref 8.9–10.3)
CO2: 25 mmol/L (ref 22–32)
Chloride: 104 mmol/L (ref 101–111)
Creatinine, Ser: 0.65 mg/dL (ref 0.44–1.00)
GLUCOSE: 117 mg/dL — AB (ref 65–99)
Potassium: 3.8 mmol/L (ref 3.5–5.1)
Sodium: 136 mmol/L (ref 135–145)
TOTAL PROTEIN: 5.4 g/dL — AB (ref 6.5–8.1)

## 2016-12-01 LAB — CBC
HCT: 35.3 % — ABNORMAL LOW (ref 36.0–46.0)
Hemoglobin: 11.4 g/dL — ABNORMAL LOW (ref 12.0–15.0)
MCH: 29.2 pg (ref 26.0–34.0)
MCHC: 32.3 g/dL (ref 30.0–36.0)
MCV: 90.3 fL (ref 78.0–100.0)
Platelets: 171 10*3/uL (ref 150–400)
RBC: 3.91 MIL/uL (ref 3.87–5.11)
RDW: 13.7 % (ref 11.5–15.5)
WBC: 8.5 10*3/uL (ref 4.0–10.5)

## 2016-12-01 LAB — TROPONIN I: Troponin I: <0.03 ng/mL (ref <0.03)

## 2016-12-01 LAB — BRAIN NATRIURETIC PEPTIDE: B NATRIURETIC PEPTIDE 5: 33.4 pg/mL (ref 0.0–100.0)

## 2016-12-01 MED ORDER — SODIUM CHLORIDE 0.9 % IV BOLUS (SEPSIS)
250.0000 mL | Freq: Once | INTRAVENOUS | Status: AC
Start: 2016-12-01 — End: 2016-12-01
  Administered 2016-12-01: 250 mL via INTRAVENOUS

## 2016-12-01 MED ORDER — ALUM & MAG HYDROXIDE-SIMETH 200-200-20 MG/5ML PO SUSP: 30.0000 mL | Freq: Four times a day (QID) | ORAL | Status: DC | PRN

## 2016-12-01 NOTE — Progress Notes (Signed)
Orthopedic Tech Progress Note Patient Details:  Catherine Patrick 01/06/1966 [id number]  Patient ID: Catherine AlimentSulwa Hoback, female   DOB: 12/22/1965, 50 y.o.   MRN: [id number]   Nikki DomCrawford, Jayden Kratochvil 12/01/2016, 1:05 PM Placed pt's rle on cpm @0 -60 degrees @1305 ; RN notified

## 2016-12-01 NOTE — Progress Notes (Signed)
Physical Therapy Treatment Patient Details Name: Catherine Patrick MRN: [id number] DOB: 04/19/1966 Today's Date: 12/01/2016    History of Present Illness 50 y.o. female [facility] 11/30/16 for elective R TKA.  Pt with significant PMHx of HTN, and HA.    PT Comments    Pt presents with improved tolerance for afternoon session this visit. Performed gait training to bathroom and x 50' out into hallway this session with increased antalgia as gait distance increases. Pt continues to require assistance for maneuvering LE's into and out of bed. Pt continues to c/o pain at 7-8/10 with mobility. Advised that this clinician would check with RN to ensure she has had medication prior to next therapy visit.    Follow Up Recommendations  Home health PT;Supervision for mobility/OOB     Equipment Recommendations  Rolling walker with 5" wheels    Recommendations for Other Services       Precautions / Restrictions Precautions Precautions: Knee Precaution Booklet Issued: Yes (comment) Precaution Comments: knee exercise handout given and WB status reviewed Restrictions Weight Bearing Restrictions: Yes RLE Weight Bearing: Weight bearing as tolerated    Mobility  Bed Mobility Overal bed mobility: Needs Assistance Bed Mobility: Supine to Sit;Sit to Supine     Supine to sit: Min assist;HOB elevated Sit to supine: Min assist   General bed mobility comments: Min assist to help progress right leg to EOB and back into bed  Transfers Overall transfer level: Needs assistance Equipment used: Rolling walker (2 wheeled) Transfers: Sit to/from Stand Sit to Stand: Min guard         General transfer comment: Min guard from EOB and from commode  Ambulation/Gait Ambulation/Gait assistance: Min guard Ambulation Distance (Feet): 50 Feet Assistive device: Rolling walker (2 wheeled) Gait Pattern/deviations: Step-to pattern;Antalgic Gait velocity: decreased Gait velocity interpretation: Below  normal speed for age/gender General Gait Details: Pt with moderately antalgic gait pattern. Verbal cues for correct LE sequencing.  Support at trunk for balance and to help support pt when she WBs on right leg. Increased antalgia as gait increases. Requires 2-3 standing rest breaks   Stairs            Wheelchair Mobility    Modified Rankin (Stroke Patients Only)       Balance Overall balance assessment: Needs assistance Sitting-balance support: Feet supported;No upper extremity supported Sitting balance-Leahy Scale: Good Sitting balance - Comments: sitting EOB with no back support   Standing balance support: Bilateral upper extremity supported;During functional activity Standing balance-Leahy Scale: Poor Standing balance comment: relies on Rw for support in standing                    Cognition Arousal/Alertness: Awake/alert Behavior During Therapy: WFL for tasks assessed/performed Overall Cognitive Status: Within Functional Limits for tasks assessed                      Exercises Total Joint Exercises Ankle Circles/Pumps: AROM;Both;20 reps Quad Sets: AROM;Right;10 reps;Supine    General Comments General comments (skin integrity, edema, etc.): pt has multiple visitors in the room during this session.       Pertinent Vitals/Pain Pain Assessment: 0-10 Pain Score: 7  Pain Location: right knee Pain Descriptors / Indicators: Grimacing;Guarding Pain Intervention(s): Limited activity within patient's tolerance;Monitored during session;Premedicated before session;Ice applied    Home Living                      Prior Function  PT Goals (current goals can now be found in the care plan section) Acute Rehab PT Goals Patient Stated Goal: to go home and get up her flight of stairs at discharge Progress towards PT goals: Progressing toward goals    Frequency    7X/week      PT Plan Current plan remains appropriate     Co-evaluation             End of Session Equipment Utilized During Treatment: Gait belt Activity Tolerance: Patient limited by pain Patient left: in bed;with call bell/phone within reach;in CPM;with SCD's reapplied     Time: [id number] PT Time Calculation (min) (ACUTE ONLY): 27 min  Charges:  $Gait Training: 23-37 mins $Therapeutic Exercise: 8-22 mins                    G Codes:      Colin BroachSabra M. Sonam Huelsmann PT, DPT  [phone]  12/01/2016, 2:33 PM

## 2016-12-01 NOTE — Progress Notes (Signed)
Patients vital signs: 92/57 blood pressure,100% room air, 74 pulse, 97.6 temperature,14 respirations. MD paged. Nursing will continue to monitor.

## 2016-12-01 NOTE — Progress Notes (Signed)
Rapid Response paged related to patients blood pressure. RN paging MD again. Nursing will continue to monitor.

## 2016-12-01 NOTE — Evaluation (Signed)
Occupational Therapy Evaluation and Discharge Patient Details Name: Catherine Patrick MRN: [id number] DOB: 08/24/1966 Today's Date: 12/01/2016    History of Present Illness 50 y.o. [facility] for elective R TKA.  Pt with significant PMHx of HTN, and HA.   Clinical Impression   PTA Pt independent in ADL and mobility. Pt currently min A for ADL and min guard for mobility with RW. Pt fully assessed and received all education including 3 in 1 shower transfer. No questions from Pt or caregiver support, No further OT needs at this time. OT to sign off thank you for this referral.     Follow Up Recommendations  No OT follow up;Supervision - Intermittent    Equipment Recommendations  3 in 1 bedside commode    Recommendations for Other Services       Precautions / Restrictions Precautions Precautions: Knee Precaution Booklet Issued: Yes (comment) Precaution Comments: knee exercise handout given previously by PT and WB status reviewed Restrictions Weight Bearing Restrictions: Yes RLE Weight Bearing: Weight bearing as tolerated      Mobility Bed Mobility Overal bed mobility: Needs Assistance Bed Mobility: Supine to Sit;Sit to Supine     Supine to sit: Min guard;HOB elevated Sit to supine: Min assist   General bed mobility comments: Min assist to help progress right leg to EOB and back into bed  Transfers Overall transfer level: Needs assistance Equipment used: Rolling walker (2 wheeled) Transfers: Sit to/from Stand Sit to Stand: Min guard         General transfer comment: Min guard from EOB and from commode    Balance Overall balance assessment: Needs assistance Sitting-balance support: Feet supported;No upper extremity supported Sitting balance-Leahy Scale: Good Sitting balance - Comments: sitting EOB with no back support   Standing balance support: No upper extremity supported;During functional activity Standing balance-Leahy Scale:  Fair Standing balance comment: able to perform sink level grooming                            ADL Overall ADL's : Needs assistance/impaired     Grooming: Wash/dry hands;Supervision/safety;Standing Grooming Details (indicate cue type and reason): sink level ADL Upper Body Bathing: Modified independent;Sitting   Lower Body Bathing: Supervison/ safety;Sitting/lateral leans   Upper Body Dressing : Modified independent;Sitting   Lower Body Dressing: Supervision/safety;Sit to/from stand Lower Body Dressing Details (indicate cue type and reason): Pt able to don/doff both socks without assist sitting EOB Toilet Transfer: Supervision/safety;Comfort height toilet;RW;Ambulation   Toileting- Clothing Manipulation and Hygiene: Supervision/safety;Sit to/from stand   Tub/ Shower Transfer: Tub transfer;Min guard;3 in 1;Rolling walker;With caregiver independent assisting Tub/Shower Transfer Details (indicate cue type and reason): Pt provided with 3 in 1 shower transfer handout Functional mobility during ADLs: Supervision/safety;Rolling walker       Vision Vision Assessment?: No apparent visual deficits   Perception     Praxis      Pertinent Vitals/Pain Pain Assessment: 0-10 Pain Score: 3  Pain Location: right knee Pain Descriptors / Indicators: Grimacing;Guarding Pain Intervention(s): Limited activity within patient's tolerance;Monitored during session;Repositioned;Premedicated before session     Hand Dominance     Extremity/Trunk Assessment Upper Extremity Assessment Upper Extremity Assessment: Overall WFL for tasks assessed   Lower Extremity Assessment Lower Extremity Assessment: RLE deficits/detail;Defer to PT evaluation RLE Deficits / Details: right leg with normal post op pain and weakness   Cervical / Trunk Assessment Cervical / Trunk Assessment: Normal   Communication Communication  Communication: No difficulties   Cognition Arousal/Alertness:  Awake/alert Behavior During Therapy: WFL for tasks assessed/performed Overall Cognitive Status: Within Functional Limits for tasks assessed                     General Comments       Exercises       Shoulder Instructions      Home Living Family/patient expects to be discharged to:: Private residence Living Arrangements: Children;Non-relatives/Friends Available Help at Discharge: Family;Available 24 hours/day Type of Home: Other(Comment) (townhome) Home Access: Stairs to enter Entergy CorporationEntrance Stairs-Number of Steps: 2 Entrance Stairs-Rails: None Home Layout: Two level;Bed/bath upstairs Alternate Level Stairs-Number of Steps: flight Alternate Level Stairs-Rails: Right Bathroom Shower/Tub: Tub/shower unit Shower/tub characteristics: Engineer, building servicesCurtain Bathroom Toilet: Standard Bathroom Accessibility: Yes How Accessible: Accessible via walker Home Equipment: None          Prior Functioning/Environment Level of Independence: Independent                 OT Problem List: Decreased strength;Decreased range of motion;Impaired balance (sitting and/or standing);Pain   OT Treatment/Interventions:      OT Goals(Current goals can be found in the care plan section) Acute Rehab OT Goals Patient Stated Goal: to go home and get up her flight of stairs at discharge OT Goal Formulation: With patient Time For Goal Achievement: 12/08/16 Potential to Achieve Goals: Good  OT Frequency:     Barriers to D/C:            Co-evaluation              End of Session Equipment Utilized During Treatment: Rolling walker  Nurse Communication: Mobility status  Activity Tolerance: Patient tolerated treatment well Patient left: in bed;with call bell/phone within reach;with family/visitor present   Time: 1550-1615 OT Time Calculation (min): 25 min Charges:  OT General Charges $OT Visit: 1 Procedure OT Evaluation $OT Eval Moderate Complexity: 1 Procedure OT Treatments $Self Care/Home  Management : 8-22 mins G-Codes:    Evern BioLaura J Aveena Bari 12/01/2016, 5:09 PM Sherryl MangesLaura Martita Brumm OTR/L [phone]

## 2016-12-01 NOTE — Progress Notes (Signed)
MD returned RN's call. Ordered CMP, BNP, troponin, and CBC labs STAT. Ordered 250 cc bolus. EKG complete reading sinus rhythm. Mylanta ordered as well. Nursing will continue to monitor.

## 2016-12-01 NOTE — Progress Notes (Signed)
Orthopedic Tech Progress Note Patient Details:  Catherine Patrick 05/10/1966 [id number]  Patient ID: Catherine Patrick, female   DOB: 04/26/1966, 50 y.o.   MRN: [id number] Applied cpm 0-60  Trinna PostMartinez, Bakary Bramer J 12/01/2016, 6:50 AM

## 2016-12-01 NOTE — Significant Event (Signed)
Rapid Response Event Note Call received per floor RN regarding Pt complaining of not feeling well. Pt also complained of chest pain.  MD on call already paged and awaiting call back. Unable to come right away, RN advised to do EKG and page MD a second time with results.    Overview: Time Called: 0345 Arrival Time: 0415 Event Type: Cardiac  Initial Focused Assessment: Pt found asleep in bed, awakened easily, oriented x 4. Follows commands. No SOB at this time, lungs clear. Heart tones WNL. ABD soft BS hypoactive. Pt complains of pain in upper mid ABD and lower mid sternum area 4/10, pain worsened with pressure in that area. Right leg dressing intact.   Interventions: EKG reviewed with slight ST changes similar to prior EKG, new BBB in lead V1, other wise SR rate 70s. Call back received from Dr. Otelia SergeantNitka, updated per floor RN prior to my arrival. Orders received for Mylanta PO, 250 NS bolus, and STAT troponin CBC CMP . BP improved after NS bolus to 101/62. Pt left resting in bed,falls asleep easily.  Plan of Care (if not transferred): RN to await lab tech for STAT labs, monitor Pt closely anf give meds as ordered. RN to update Provider and myself for worsening condition.   Event Summary: Name of Physician Notified: Dr. Otelia SergeantNitka at 0345    at    Outcome: Stayed in room and stabalized     White, James IvanoffBrooke Leigh Gibran Veselka

## 2016-12-01 NOTE — Progress Notes (Signed)
Rapid Response at bedside.

## 2016-12-01 NOTE — Progress Notes (Signed)
Physical Therapy Treatment Patient Details Name: Catherine Patrick MRN: [id number] DOB: 02/06/1966 Today's Date: 12/01/2016    History of Present Illness 50 y.o. female [facility] for elective R TKA.  Pt with significant PMHx of HTN, and HA.    PT Comments    Pt is POD 1 following the above procedure. Pt presents with increased c/o pain this session at 8/10 and had extended release medications 45 min prior to session per Rn. Pt is able to tolerate light supine exercises and wanted to be placed in CPM. Applied ice to RLE and advised pt this clinician would return to perform gait activities next session.    Follow Up Recommendations  Home health PT;Supervision for mobility/OOB     Equipment Recommendations  Rolling walker with 5" wheels    Recommendations for Other Services       Precautions / Restrictions Precautions Precautions: Knee Precaution Booklet Issued: Yes (comment) Precaution Comments: knee exercise handout given and WB status reviewed Restrictions Weight Bearing Restrictions: Yes RLE Weight Bearing: Weight bearing as tolerated    Mobility  Bed Mobility                  Transfers                    Ambulation/Gait                 Stairs            Wheelchair Mobility    Modified Rankin (Stroke Patients Only)       Balance                                    Cognition Arousal/Alertness: Awake/alert Behavior During Therapy: WFL for tasks assessed/performed Overall Cognitive Status: Within Functional Limits for tasks assessed                      Exercises Total Joint Exercises Ankle Circles/Pumps: AROM;Both;20 reps Quad Sets: AROM;Right;10 reps;Supine    General Comments        Pertinent Vitals/Pain Pain Assessment: 0-10 Pain Score: 8  Pain Location: right knee Pain Descriptors / Indicators: Grimacing;Guarding Pain Intervention(s): Limited activity within patient's  tolerance;Patient requesting pain meds-RN notified;Ice applied;Monitored during session;Premedicated before session;Repositioned    Home Living                      Prior Function            PT Goals (current goals can now be found in the care plan section) Acute Rehab PT Goals Patient Stated Goal: to go home and get up her flight of stairs at discharge Progress towards PT goals: Not progressing toward goals - comment (limited by pain this session)    Frequency    7X/week      PT Plan Current plan remains appropriate    Co-evaluation             End of Session Equipment Utilized During Treatment: Gait belt Activity Tolerance: Patient limited by pain Patient left: in bed;with call bell/phone within reach;in CPM;with SCD's reapplied     Time: [id number] PT Time Calculation (min) (ACUTE ONLY): 18 min  Charges:  $Therapeutic Exercise: 8-22 mins                    G Codes:  Colin BroachSabra M. Dontray Haberland PT, DPT  [phone]  12/01/2016, 11:36 AM

## 2016-12-01 NOTE — Anesthesia Postprocedure Evaluation (Signed)
Anesthesia Post Note  Patient: Catherine Patrick  Procedure(s) Performed: Procedure(s) (LRB): RIGHT TOTAL KNEE ARTHROPLASTY (Right)  Patient location during evaluation: PACU Anesthesia Type: Spinal and Regional Level of consciousness: oriented and awake and alert Pain management: pain level controlled Vital Signs Assessment: post-procedure vital signs reviewed and stable Respiratory status: spontaneous breathing, respiratory function stable and patient connected to nasal cannula oxygen Cardiovascular status: blood pressure returned to baseline and stable Postop Assessment: no headache and no backache Anesthetic complications: no       Last Vitals:  Vitals:   12/01/16 0556 12/01/16 1300  BP: (!) 91/59 (!) 147/76  Pulse: 76 80  Resp: 14 16  Temp: 36.7 C 36.7 C    Last Pain:  Vitals:   12/01/16 1416  TempSrc:   PainSc: 4                  Shelton SilvasKevin D Zakkery Dorian

## 2016-12-01 NOTE — Progress Notes (Signed)
Received call this AM, 50 year old female patient less than 24 hours post TKR with complaints of chest pain. No history of CAD, MI, HTN, Hypercholestremia or Diabetes. She does not smoke and has had no history of angina. She has had a low blood pressure since surgery and was placed on IVR of 125CC/hr post op by Dr.Xu. Her pulse rate 79 and oxygen saturation 97%.  Her major complaint was chest discomfort. Nursing had informed me that rapid response had been contacted but  Were unable to come immediately due to a concurrent code else where. She is described as awake and alert. Laboratory test were verbally ordered including Troponin, CBC, CMET and BNP. AM lab cancelled. EKG had already been done and reportedly normal sinus without acute changes. I recommended mylanta and start O2 per nasal cannula awaiting the results of lab tests. BNP is 33 nl, Troponin <0.03 nl, Hgb 11.4, not significantly decreased CMET normal. I suspect ASA given for antiDVT prophylaxis may be a cause for GI upset. She apparently has been running low SBP during her hospital stay. A mild fluid bolus of 250cc as she is already on a IVrate of 125, wgt 167 lbs. Based on her clinical information she is low risk for MI, PE is possible but her oxygen saturation is normal and she was breathing normally. O2 was started. Rule out MI lab done nonetheless and bolus started.

## 2016-12-01 NOTE — Care Management Note (Addendum)
Case Management Note  Patient Details  Name: Catherine Patrick MRN: [id number] Date of Birth: [birth year]  Subjective/Objective:   Pt s/p TKA on 12/27.  PTA, pt independent, lives with family.  Friends/family to assist at dc.                   Action/Plan: Met with pt to discuss dc plans.  Referral to [facility] to evaluate for charity program for HHPT.  Will also need assistance with DME.  Expected Discharge Date:                  Expected Discharge Plan:  [place]  In-House Referral:     Discharge planning Services  CM Consult  Post Acute Care Choice:    Choice offered to:     DME Arranged:    DME Agency:     HH Arranged:    [place] Agency:     Status of Service:  In process, will continue to follow  If discussed at Long Length of Stay Meetings, dates discussed:    Additional Comments:  Ella Bodo, RN 12/01/2016, 5:18 PM

## 2016-12-01 NOTE — Progress Notes (Signed)
Labs CMET, BNP, CBC, and troponin labs were completed STAT. Results are currently available on patients chart. RN was not notified from lab of any critical values. RN administered Maalox. Patient currently asleep and no longer complaining of chest pain. Nursing will continue to monitor.

## 2016-12-01 NOTE — Progress Notes (Signed)
   Subjective:  Patient reports pain as moderate.  Cardiac w/u negative overnight.  Has improved with mylanta and GI cocktail  Objective:   VITALS:   Vitals:   11/30/16 1900 12/01/16 0320 12/01/16 0427 12/01/16 0556  BP: 123/77 (!) 92/57 101/62 (!) 91/59  Pulse: 84 76 70 76  Resp: 15 14 13 14   Temp: 98.1 F (36.7 C) 97.6 F (36.4 C)  98 F (36.7 C)  TempSrc: Oral Axillary  Oral  SpO2: 98% 100% 100% 97%  Weight:      Height:        Neurologically intact Neurovascular intact Sensation intact distally Intact pulses distally Dorsiflexion/Plantar flexion intact Incision: dressing C/D/I and no drainage No cellulitis present Compartment soft   Lab Results  Component Value Date   WBC 8.5 12/01/2016   HGB 11.4 (L) 12/01/2016   HCT 35.3 (L) 12/01/2016   MCV 90.3 12/01/2016   PLT 171 12/01/2016     Assessment/Plan:  1 Day Post-Op   - Expected postop acute blood loss anemia - will monitor for symptoms - Up with PT/OT - DVT ppx - SCDs, ambulation, aspirin - WBAT operative extremity - Pain control - Discharge planning  Glee ArvinMichael Pamelyn Bancroft 12/01/2016, 7:48 AM [phone]

## 2016-12-01 NOTE — Progress Notes (Signed)
[facility]. Nursing will continue to monitor

## 2016-12-02 MED FILL — OxyCONTIN 10 MG T12A: 10 | 5 days supply | Qty: 10 | Fill #0

## 2016-12-02 MED FILL — ?ONDANSETRON HCL 4 MG TABLE: 4 | 6 days supply | Qty: 40 | Fill #0

## 2016-12-02 MED FILL — METHOCARBAMOL 750 MG TABLET: 750 | 30 days supply | Qty: 60 | Fill #0

## 2016-12-02 NOTE — Progress Notes (Signed)
Physical Therapy Treatment Patient Details Name: Catherine Patrick MRN: [id number] DOB: 12/03/1966 Today's Date: 12/02/2016    History of Present Illness 50 y.o. female [facility]16 for elective R TKA.  Pt with significant PMHx of HTN, and HA.    PT Comments    Patient continues to progress toward mobility goals. Current plan remains appropriate.   Follow Up Recommendations  Home health PT;Supervision for mobility/OOB     Equipment Recommendations  Rolling walker with 5" wheels    Recommendations for Other Services       Precautions / Restrictions Precautions Precautions: Knee Precaution Booklet Issued: Yes (comment) Precaution Comments: knee exercise handout given and WB status reviewed Restrictions Weight Bearing Restrictions: Yes RLE Weight Bearing: Weight bearing as tolerated    Mobility  Bed Mobility Overal bed mobility: Needs Assistance Bed Mobility: Supine to Sit     Supine to sit: Modified independent (Device/Increase time)     General bed mobility comments: increased time and effort; HOB and no use of rails  Transfers Overall transfer level: Needs assistance Equipment used: Rolling walker (2 wheeled) Transfers: Sit to/from Stand Sit to Stand: Supervision         General transfer comment: supervision for safety; cues for hand placement  Ambulation/Gait Ambulation/Gait assistance: Supervision Ambulation Distance (Feet): 120 Feet Assistive device: Rolling walker (2 wheeled) Gait Pattern/deviations: Step-through pattern;Decreased stance time - right;Decreased step length - left;Decreased weight shift to right Gait velocity: decreased   General Gait Details: cues for R knee flexion during swing phase and proximity of RW; pt with improved step through pattern   Stairs Stairs: Yes   Stair Management: One rail Right;Step to pattern;Sideways;No rails;Backwards;With walker Number of Stairs:  (2 steps X3) General stair comments: cues for  sequencing and technique; 4 steps sideways with R hand rail wth min guard and 2 steps backwards with RW with min A to stabilize RW  Wheelchair Mobility    Modified Rankin (Stroke Patients Only)       Balance     Sitting balance-Leahy Scale: Good       Standing balance-Leahy Scale: Fair Standing balance comment: static stand                    Cognition Arousal/Alertness: Awake/alert Behavior During Therapy: WFL for tasks assessed/performed Overall Cognitive Status: Within Functional Limits for tasks assessed                      Exercises Total Joint Exercises Quad Sets: AROM;Right;10 reps Heel Slides: AROM;Right;10 reps Hip ABduction/ADduction: AROM;Right;10 reps Straight Leg Raises: AAROM;Right;5 reps Goniometric ROM: 5-70    General Comments General comments (skin integrity, edema, etc.): son present      Pertinent Vitals/Pain Pain Assessment: Faces Faces Pain Scale: Hurts little more Pain Location: right knee Pain Descriptors / Indicators: Grimacing;Guarding Pain Intervention(s): Limited activity within patient's tolerance;Monitored during session;Premedicated before session;Repositioned    Home Living                      Prior Function            PT Goals (current goals can now be found in the care plan section) Acute Rehab PT Goals Patient Stated Goal: go home Progress towards PT goals: Progressing toward goals    Frequency    7X/week      PT Plan Current plan remains appropriate    Co-evaluation  End of Session Equipment Utilized During Treatment: Gait belt Activity Tolerance: Patient tolerated treatment well Patient left: with call bell/phone within reach;in chair;with family/visitor present     Time: 1126-1203 PT Time Calculation (min) (ACUTE ONLY): 37 min  Charges:  $Gait Training: 8-22 mins $Therapeutic Exercise: 8-22 mins                    G Codes:      Derek MoundKellyn R Delvon Chipps  Ethanael Veith, PTA Pager: [phone]   12/02/2016, 1:50 PM

## 2016-12-02 NOTE — Discharge Summary (Signed)
Physician Discharge Summary      Patient ID: Catherine Patrick MRN: [id number] DOB/AGE: 1966/10/08 50 y.o.  Admit date: 11/30/2016 Discharge date: 12/02/2016  Admission Diagnoses:  <principal problem not specified>  Discharge Diagnoses:  Active Problems:   Total knee replacement status   Past Medical History:  Diagnosis Date  . DJD (degenerative joint disease)   . Headache   . History of blood transfusion    2 times after child birth  . History of kidney stones    passed one in 2014- 2017  . Hypertension   . Kidney stones     Surgeries: Procedure(s): RIGHT TOTAL KNEE ARTHROPLASTY on 11/30/2016   Consultants (if any):   Discharged Condition: Improved  Hospital Course: Catherine Patrick is an 50 y.o. female who was admitted 11/30/2016 with a diagnosis of <principal problem not specified> and went to the operating room on 11/30/2016 and underwent the above named procedures.    She was given perioperative antibiotics:  Anti-infectives    Start     Dose/Rate Route Frequency Ordered Stop   11/30/16 1500  ceFAZolin (ANCEF) IVPB 2g/100 mL premix     2 g 200 mL/hr over 30 Minutes Intravenous Every 6 hours 11/30/16 1253 11/30/16 2132   11/30/16 0800  ceFAZolin (ANCEF) IVPB 2g/100 mL premix     2 g 200 mL/hr over 30 Minutes Intravenous To [facility] 11/29/16 0819 11/30/16 0900    .  She was given sequential compression devices, early ambulation, and aspirin for DVT prophylaxis.  She benefited maximally from the hospital stay and there were no complications.    Recent vital signs:  Vitals:   12/01/16 2006 12/02/16 0422  BP: 135/84 120/66  Pulse: 90 84  Resp: 16 16  Temp: 98 F (36.7 C) 97.7 F (36.5 C)    Recent laboratory studies:  Lab Results  Component Value Date   HGB 11.4 (L) 12/01/2016   HGB 15.0 11/22/2016   HGB 14.0 10/22/2016   Lab Results  Component Value Date   WBC 8.5 12/01/2016   PLT 171 12/01/2016   Lab Results  Component Value Date   INR 1.07 11/22/2016   Lab Results  Component Value Date   NA 136 12/01/2016   K 3.8 12/01/2016   CL 104 12/01/2016   CO2 25 12/01/2016   BUN 12 12/01/2016   CREATININE 0.65 12/01/2016   GLUCOSE 117 (H) 12/01/2016    Discharge Medications:   Allergies as of 12/02/2016      Reactions   Amlodipine Palpitations   Took one and Heart Palpations started and then stoppped- took it at 2200 Had palpations at 0100 for a seconds-      Medication List    TAKE these medications   acetaminophen-codeine 300-30 MG tablet Commonly known as:  TYLENOL #3 Take 1-2 tablets by mouth at bedtime as needed for moderate pain.   amLODipine 5 MG tablet Commonly known as:  NORVASC Take 1 tablet (5 mg total) by mouth daily.   aspirin EC 325 MG tablet Take 1 tablet (325 mg total) by mouth 2 (two) times daily.   Glucosamine-Chondroitin 750-600 MG Tabs Take 2 tablets by mouth daily.   ibuprofen 600 MG tablet Commonly known as:  ADVIL,MOTRIN Take 1 tablet (600 mg total) by mouth every 8 (eight) hours as needed.   methocarbamol 750 MG tablet Commonly known as:  ROBAXIN Take 1 tablet (750 mg total) by mouth 2 (two) times daily as needed for muscle spasms.   ondansetron 4  MG tablet Commonly known as:  ZOFRAN Take 1-2 tablets (4-8 mg total) by mouth every 8 (eight) hours as needed for nausea or vomiting.   OVER THE COUNTER MEDICATION Apply 1 application topically 4 (four) times daily as needed (for pain.). MOOV CREAM contains Mentha Spicata Topical 5% , [place] Oil Topical 2%, Turpentine Oil Topical 3% and Wintergreen Oil Topical 15% as active ingredients.   oxyCODONE 5 MG immediate release tablet Commonly known as:  Oxy IR/ROXICODONE Take 1-3 tablets (5-15 mg total) by mouth every 4 (four) hours as needed.   oxyCODONE 10 mg 12 hr tablet Commonly known as:  OXYCONTIN Take 1 tablet (10 mg total) by mouth every 12 (twelve) hours.   promethazine 25 MG tablet Commonly known as:  PHENERGAN Take 1  tablet (25 mg total) by mouth every 6 (six) hours as needed for nausea.   senna-docusate 8.6-50 MG tablet Commonly known as:  SENOKOT S Take 1 tablet by mouth at bedtime as needed.   Turmeric 500 MG Caps Take 500 mg by mouth daily.   VITAMIN D PO Take 1 capsule by mouth every evening.            Durable Medical Equipment        Start     Ordered   11/30/16 1254  DME Walker rolling  Once    Question:  Patient needs a walker to treat with the following condition  Answer:  Total knee replacement status   11/30/16 1253   11/30/16 1254  DME 3 n 1  Once     11/30/16 1253   11/30/16 1254  DME Bedside commode  Once    Question:  Patient needs a bedside commode to treat with the following condition  Answer:  Total knee replacement status   11/30/16 1253      Diagnostic Studies: Dg Knee Right Port  Result Date: 11/30/2016 CLINICAL DATA:  Post total right knee replacement EXAM: PORTABLE RIGHT KNEE - 1-2 VIEW COMPARISON:  09/29/2015. FINDINGS: Two views of the right knee submitted. There is right knee prosthesis with anatomic alignment. Postsurgical changes are noted with periarticular soft tissue air. IMPRESSION: Right knee prosthesis with anatomic alignment. Electronically Signed   By: Natasha MeadLiviu  Pop M.D.   On: 11/30/2016 11:29   Ct Renal Stone Study  Result Date: 11/18/2016 CLINICAL DATA:  Left kidney stone follow-up EXAM: CT ABDOMEN AND PELVIS WITHOUT CONTRAST TECHNIQUE: Multidetector CT imaging of the abdomen and pelvis was performed following the standard protocol without IV contrast. COMPARISON:  10/22/2016 FINDINGS: Lower chest: The lung bases are normal. Hepatobiliary: Unenhanced liver shows no biliary ductal dilatation. Stable 5.4 cm cyst in left hepatic lobe. Pancreas: Unenhanced pancreas appears normal. Spleen: Unenhanced spleen is normal. Adrenals/Urinary Tract: No adrenal gland mass. At least 2 nonobstructive calcified calculi are noted within right kidney the largest in lower  pole measures 3 mm. At least 3 nonobstructive calcified calculi are noted within left kidney the largest in lower pole measures 4.8 mm. No hydronephrosis or hydroureter. No calcified ureteral calculi. The urinary bladder is under distended. Nonspecific mild thickening of anterior wall of urinary bladder. Stomach/Bowel: No small bowel obstruction. Moderate stool noted within cecum. No pericecal inflammation. Normal appendix. Moderate stool and gas noted in transverse colon descending colon and rectosigmoid colon. Scattered colonic diverticula are noted right colon descending colon and sigmoid colon. No evidence of acute colitis or diverticulitis. Vascular/Lymphatic: No aortic aneurysm. No retroperitoneal or mesenteric adenopathy. Reproductive: The uterus is anteflexed.  No adnexal  mass. Other: No ascites or free abdominal air. Musculoskeletal: No destructive bony lesions are noted. Stable probable bone island L3 vertebral body. IMPRESSION: 1. There is bilateral nonobstructive nephrolithiasis. No hydronephrosis or hydroureter. No calcified ureteral calculi are identified. 2. No calcified calculi are noted within urinary bladder. Nonspecific mild thickening of anterior wall of urinary bladder. 3. Normal appendix.  No pericecal inflammation. 4. Moderate stool and colonic gas. Scattered colonic diverticula. No evidence of acute colitis or diverticulitis. No small bowel or colonic obstruction. 5. No pelvic mass is noted. Electronically Signed   By: Natasha MeadLiviu  Pop M.D.   On: 11/18/2016 13:50    Disposition: 01-Home or Self Care  Discharge Instructions    Call MD / Call 911    Complete by:  As directed    If you experience chest pain or shortness of breath, CALL 911 and be transported to the hospital emergency room.  If you develope a fever above 101.5 F, pus (white drainage) or increased drainage or redness at the wound, or calf pain, call your surgeon's office.   Constipation Prevention    Complete by:  As directed     Drink plenty of fluids.  Prune juice may be helpful.  You may use a stool softener, such as Colace (over the counter) 100 mg twice a day.  Use MiraLax (over the counter) for constipation as needed.   Driving restrictions    Complete by:  As directed    No driving while taking narcotic pain meds.   Increase activity slowly as tolerated    Complete by:  As directed       Follow-up Information    Glee ArvinMichael Maalle Starrett, MD In 2 weeks.   Specialty:  Orthopedic Surgery Why:  For suture removal, For wound re-check Contact information: [address] [place] [address] [phone]            Signed: Glee ArvinMichael Thanos [place]au 12/02/2016, 7:57 AM

## 2016-12-02 NOTE — Progress Notes (Signed)
Pt ready for d/c home per MD. Pt met PT/OT goals, equipment was delivered to room. Pt's pain controlled on PO pain medication. Discharge instructions and prescriptions reviewed with pt and family, all questions answered. Belongings packed and sent with son. Pt assisted to car via NT.

## 2016-12-02 NOTE — Care Management Note (Signed)
Case Management Note  Patient Details  Name: Catherine Patrick MRN: [id number] Date of Birth: 03/19/1966  Subjective/Objective:    50 yr old female s/p right total knee arthroplasty.                 Action/Plan: Case manager spoke with patient concerning Home health and DME needs. Referral was called to Janeice RobinsonKaren Nusbaumm, [facility] Liaison. CM has ordered youth  RW and 3in1 for patient. Patient will have family support at discharge.   Expected Discharge Date:   12/02/16               Expected Discharge Plan:  Home w Home Health Services  In-House Referral:  NA  Discharge planning Services  CM Consult  Post Acute Care Choice:  Durable Medical Equipment, Home Health Choice offered to:  Patient  DME Arranged:  3-N-1, Walker youth DME Agency:  [facility]  HH Arranged:  PT HH Agency:  [facility]  Status of Service:  Completed, signed off  If discussed at Long Length of Stay Meetings, dates discussed:    Additional Comments:  Durenda GuthrieBrady, Adaora Mchaney Naomi, RN 12/02/2016, 11:54 AM

## 2016-12-02 NOTE — Progress Notes (Signed)
   Subjective:  Pain is mild.  Tolerating CPM.  Denies anymore chest discomfort.  Cardiac w/u negative.  Objective:   VITALS:   Vitals:   12/01/16 0556 12/01/16 1300 12/01/16 2006 12/02/16 0422  BP: (!) 91/59 (!) 147/76 135/84 120/66  Pulse: 76 80 90 84  Resp: 14 16 16 16   Temp: 98 F (36.7 C) 98.1 F (36.7 C) 98 F (36.7 C) 97.7 F (36.5 C)  TempSrc: Oral Oral Oral Oral  SpO2: 97% 98% 97% 99%  Weight:      Height:        Neurologically intact Neurovascular intact Sensation intact distally Intact pulses distally Dorsiflexion/Plantar flexion intact Incision: dressing C/D/I and no drainage No cellulitis present Compartment soft   Lab Results  Component Value Date   WBC 8.5 12/01/2016   HGB 11.4 (L) 12/01/2016   HCT 35.3 (L) 12/01/2016   MCV 90.3 12/01/2016   PLT 171 12/01/2016     Assessment/Plan:  2 Days Post-Op   - stair training today with PT - needs [facility] for HHPT - appreciate case management assistance - home today in early afternoon  Glee ArvinMichael Tecora Patrick 12/02/2016, 7:54 AM [phone]

## 2016-12-11 ENCOUNTER — Emergency Department (HOSPITAL_BASED_OUTPATIENT_CLINIC_OR_DEPARTMENT_OTHER)
Admit: 2016-12-11 | Discharge: 2016-12-11 | Disposition: A | Discharge status: Home / Self Care | Payer: Medicaid Other | Attending: Student | Admitting: Student

## 2016-12-11 ENCOUNTER — Encounter (HOSPITAL_COMMUNITY): Payer: Self-pay | Admitting: Emergency Medicine

## 2016-12-11 ENCOUNTER — Emergency Department (HOSPITAL_COMMUNITY): Payer: Medicaid Other

## 2016-12-11 ENCOUNTER — Emergency Department (HOSPITAL_COMMUNITY)
Admission: EM | Admit: 2016-12-11 | Discharge: 2016-12-11 | Disposition: A | Discharge status: Home / Self Care | Payer: Medicaid Other | Attending: Emergency Medicine | Admitting: Emergency Medicine

## 2016-12-11 DIAGNOSIS — I10 Essential (primary) hypertension: Secondary | ICD-10-CM | POA: Insufficient documentation

## 2016-12-11 DIAGNOSIS — M25461 Effusion, right knee: Secondary | ICD-10-CM

## 2016-12-11 DIAGNOSIS — Z7982 Long term (current) use of aspirin: Secondary | ICD-10-CM | POA: Diagnosis not present

## 2016-12-11 DIAGNOSIS — Z96651 Presence of right artificial knee joint: Secondary | ICD-10-CM | POA: Insufficient documentation

## 2016-12-11 DIAGNOSIS — Z79899 Other long term (current) drug therapy: Secondary | ICD-10-CM | POA: Insufficient documentation

## 2016-12-11 DIAGNOSIS — M79609 Pain in unspecified limb: Secondary | ICD-10-CM

## 2016-12-11 DIAGNOSIS — G8918 Other acute postprocedural pain: Secondary | ICD-10-CM | POA: Diagnosis not present

## 2016-12-11 DIAGNOSIS — M7989 Other specified soft tissue disorders: Secondary | ICD-10-CM

## 2016-12-11 DIAGNOSIS — M25561 Pain in right knee: Secondary | ICD-10-CM

## 2016-12-11 LAB — I-STAT CHEM 8, ED
BUN: 3 mg/dL — ABNORMAL LOW (ref 6–20)
CHLORIDE: 106 mmol/L (ref 101–111)
Calcium, Ion: 1.15 mmol/L (ref 1.15–1.40)
Creatinine, Ser: 0.7 mg/dL (ref 0.44–1.00)
GLUCOSE: 87 mg/dL (ref 65–99)
HCT: 31 % — ABNORMAL LOW (ref 36.0–46.0)
HEMOGLOBIN: 10.5 g/dL — AB (ref 12.0–15.0)
POTASSIUM: 3.6 mmol/L (ref 3.5–5.1)
SODIUM: 142 mmol/L (ref 135–145)
TCO2: 28 mmol/L (ref 0–100)

## 2016-12-11 LAB — CBC WITH DIFFERENTIAL/PLATELET
Basophils Absolute: 0 10*3/uL (ref 0.0–0.1)
Basophils Relative: 0 %
Eosinophils Absolute: 0.4 10*3/uL (ref 0.0–0.7)
Eosinophils Relative: 4 %
HEMATOCRIT: 36.1 % (ref 36.0–46.0)
HEMOGLOBIN: 11.8 g/dL — AB (ref 12.0–15.0)
Lymphocytes Relative: 33 %
Lymphs Abs: 3.4 10*3/uL (ref 0.7–4.0)
MCH: 29.4 pg (ref 26.0–34.0)
MCHC: 32.7 g/dL (ref 30.0–36.0)
MCV: 90 fL (ref 78.0–100.0)
MONOS PCT: 6 %
Monocytes Absolute: 0.7 10*3/uL (ref 0.1–1.0)
NEUTROS ABS: 6 10*3/uL (ref 1.7–7.7)
NEUTROS PCT: 57 %
Platelets: 300 10*3/uL (ref 150–400)
RBC: 4.01 MIL/uL (ref 3.87–5.11)
RDW: 14.2 % (ref 11.5–15.5)
WBC: 10.5 10*3/uL (ref 4.0–10.5)

## 2016-12-11 LAB — BASIC METABOLIC PANEL
ANION GAP: 9 (ref 5–15)
BUN: 6 mg/dL (ref 6–20)
CO2: 26 mmol/L (ref 22–32)
Calcium: 9.4 mg/dL (ref 8.9–10.3)
Chloride: 105 mmol/L (ref 101–111)
Creatinine, Ser: 0.65 mg/dL (ref 0.44–1.00)
GFR calc Af Amer: >60 mL/min (ref >60)
Glucose, Bld: 82 mg/dL (ref 65–99)
POTASSIUM: 5.3 mmol/L — AB (ref 3.5–5.1)
Sodium: 140 mmol/L (ref 135–145)

## 2016-12-11 LAB — C-REACTIVE PROTEIN: CRP: 8.6 mg/dL — AB (ref <1.0)

## 2016-12-11 LAB — SEDIMENTATION RATE: SED RATE: 38 mm/h — AB (ref 0–22)

## 2016-12-11 MED ORDER — MORPHINE SULFATE (PF) 4 MG/ML IV SOLN
4.0000 mg | Freq: Once | INTRAVENOUS | Status: AC
  Administered 2016-12-11: 4 mg via INTRAVENOUS
  Filled 2016-12-11: qty 1

## 2016-12-11 MED ORDER — MORPHINE SULFATE (PF) 4 MG/ML IV SOLN
4.0000 mg | Freq: Once | INTRAVENOUS | Status: AC
Start: 2016-12-11 — End: 2016-12-11
  Administered 2016-12-11: 4 mg via INTRAVENOUS
  Filled 2016-12-11: qty 1

## 2016-12-11 MED ORDER — ONDANSETRON HCL 4 MG/2ML IJ SOLN
4.0000 mg | Freq: Once | INTRAMUSCULAR | Status: AC
  Administered 2016-12-11: 4 mg via INTRAVENOUS
  Filled 2016-12-11: qty 2

## 2016-12-11 NOTE — ED Provider Notes (Signed)
[place] DEPT Provider Note   CSN: [id number] Arrival date & time: 12/11/16  1025     History   Chief Complaint Chief Complaint  Patient presents with  . Post-op Problem  . Joint Swelling    HPI Catherine Patrick is a 51 y.o. female.  51 year old with past medical history significant for hypertension and right total knee arthroplasty that presents to the ED today with right knee pain and swelling. Patient states that she had a total knee replacement done on 11/30/16 by Dr. Erlinda Hong. Patient states that she was in the hospital for 2 days following the surgery. She had OT and PT performed at that time. Patient was discharged home with in home physical therapist. States that she has been doing well up until approximately 2 days ago. On Friday after physical therapy she noticed significant edema to her right lower extremity. She also states that erythema and warmth around her knee was new in onset. Patient states she has been able to flex or extend her right knee since Friday. States that the redness and warmth has increased. Complains of edema to the entire right lower extremity with calf tenderness. Moving makes pain worse. Nothing makes the pain better. She has not tried anything at home for the pain. Patient has not taken her dressing off since surgery. States she has appointment with her orthopedist on Tuesday. She denies any fever, chills, headache, vision changes, nausea, vomiting, abdominal pain, urinary symptoms, change in bowel habits, paresthesias.      Past Medical History:  Diagnosis Date  . DJD (degenerative joint disease)   . Headache   . History of blood transfusion    2 times after child birth  . History of kidney stones    passed one in 2014- 2017  . Hypertension   . Kidney stones     Patient Active Problem List   Diagnosis Date Noted  . 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 Primary osteoarthritis of both knees 11/18/2016  . HTN (hypertension) 11/10/2016  .  Cervical radiculopathy 09/19/2016  . Left arm pain 09/19/2016  . Primary osteoarthritis of right knee 09/30/2015  . Left lateral ankle pain 09/30/2015  . Plantar fasciitis of left foot 12/18/2013  . 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 Hydronephrosis, right 05/30/2013  . Nephrolithiasis 05/30/2013    Past Surgical History:  Procedure Laterality Date  . CESAREAN SECTION    . CESAREAN SECTION    . HERNIA REPAIR    . LIPOSUCTION  2002  . TOTAL KNEE ARTHROPLASTY Right 11/30/2016   Procedure: RIGHT TOTAL KNEE ARTHROPLASTY;  Surgeon: Leandrew Koyanagi, MD;  Location: [facility];  Service: Orthopedics;  Laterality: Right;    OB History    Gravida Para Term Preterm AB Living             3   SAB TAB Ectopic Multiple Live Births                   Home Medications    Prior to Admission medications   Medication Sig Start Date End Date Taking? Authorizing Provider  acetaminophen-codeine (TYLENOL #3) 300-30 MG tablet Take 1-2 tablets by mouth at bedtime as needed for moderate pain. 10/26/16   Tiffany Daneil Dan, PA-C  amLODipine (NORVASC) 5 MG tablet Take 1 tablet (5 mg total) by mouth daily. 11/18/16   Boykin Nearing, MD  aspirin EC 325 MG tablet Take 1 tablet (325 mg total) by mouth 2 (  two) times daily. 11/30/16   Leandrew Koyanagi, MD  Cholecalciferol (VITAMIN D PO) Take 1 capsule by mouth every evening.     Historical Provider, MD  Glucosamine-Chondroitin 750-600 MG TABS Take 2 tablets by mouth daily. 11/18/16   Josalyn Funches, MD  ibuprofen (ADVIL,MOTRIN) 600 MG tablet Take 1 tablet (600 mg total) by mouth every 8 (eight) hours as needed. 11/18/16   Josalyn Funches, MD  methocarbamol (ROBAXIN) 750 MG tablet Take 1 tablet (750 mg total) by mouth 2 (two) times daily as needed for muscle spasms. 11/30/16   Naiping Ephriam Jenkins, MD  ondansetron (ZOFRAN) 4 MG tablet Take 1-2 tablets (4-8 mg total) by mouth every 8 (eight) hours as needed for nausea or vomiting.  11/30/16   Naiping Ephriam Jenkins, MD  OVER THE COUNTER MEDICATION Apply 1 application topically 4 (four) times daily as needed (for pain.). MOOV CREAM contains Mentha Spicata Topical 5% , [place] Oil Topical 2%, Turpentine Oil Topical 3% and Wintergreen Oil Topical 15% as active ingredients.    Historical Provider, MD  oxyCODONE (OXY IR/ROXICODONE) 5 MG immediate release tablet Take 1-3 tablets (5-15 mg total) by mouth every 4 (four) hours as needed. 11/30/16   Leandrew Koyanagi, MD  oxyCODONE (OXYCONTIN) 10 mg 12 hr tablet Take 1 tablet (10 mg total) by mouth every 12 (twelve) hours. 11/30/16   Naiping Ephriam Jenkins, MD  promethazine (PHENERGAN) 25 MG tablet Take 1 tablet (25 mg total) by mouth every 6 (six) hours as needed for nausea. 11/30/16   Naiping Ephriam Jenkins, MD  senna-docusate (SENOKOT S) 8.6-50 MG tablet Take 1 tablet by mouth at bedtime as needed. 11/30/16   Leandrew Koyanagi, MD  Turmeric 500 MG CAPS Take 500 mg by mouth daily. 11/18/16   Boykin Nearing, MD    Family History Family History  Problem Relation Age of Onset  . Cancer Neg Hx   . Diabetes Neg Hx   . Heart failure Neg Hx   . Hyperlipidemia Neg Hx   . Hypertension Neg Hx     Social History Social History  Substance Use Topics  . Smoking status: Never Smoker  . Smokeless tobacco: Never Used  . Alcohol use No     Allergies   Amlodipine   Review of Systems Review of Systems  Constitutional: Negative for chills and fever.  HENT: Negative for congestion.   Cardiovascular: Positive for leg swelling. Negative for chest pain and palpitations.  Gastrointestinal: Negative for abdominal pain, diarrhea, nausea and vomiting.  Musculoskeletal: Positive for arthralgias, gait problem, joint swelling and myalgias.  Skin: Positive for color change and wound.  Neurological: Negative for dizziness, syncope, weakness, light-headedness and headaches.  All other systems reviewed and are negative.    Physical Exam Updated Vital Signs BP 130/78   Pulse 86    Temp 98.3 F (36.8 C) (Oral)   Resp 17   Ht 4' 8"  (1.422 m)   Wt 75.8 kg   LMP 11/18/2013   SpO2 100%   BMI 37.44 kg/m   Physical Exam  Constitutional: She is oriented to person, place, and time. She appears well-developed and well-nourished. No distress.  Unable to put pressure on her right leg. Appears to be in mild discomfort due to pain.  HENT:  Head: Normocephalic and atraumatic.  Mouth/Throat: Oropharynx is clear and moist.  Eyes: Conjunctivae are normal. Right eye exhibits no discharge. Left eye exhibits no discharge. No scleral icterus.  Neck: Normal range of motion. Neck supple. No thyromegaly  present.  Cardiovascular: Normal rate, regular rhythm, normal heart sounds and intact distal pulses.   Pulmonary/Chest: Effort normal and breath sounds normal.  Abdominal: Soft. Bowel sounds are normal. She exhibits no distension. There is no tenderness. There is no rebound and no guarding.  Musculoskeletal: Normal range of motion.  Patient with tenderness to palpation of the entire right lower extremity. Significant edema of the entire right lower extremity is noted. DP pulses are 2+ bilaterally. Sensation intact. Cap refill normal. There is significant amount of erythema over the right knee. Pt is unable to flex her right knee. Warmth is noted. No purulent drainage is appreciated. Bandage was removed. Midline incision was well approximated. No erythema or edema around the incision. No drainage noted.  Lymphadenopathy:    She has no cervical adenopathy.  Neurological: She is alert and oriented to person, place, and time.  Skin: Skin is warm and dry. Capillary refill takes less than 2 seconds. There is erythema.  Nursing note and vitals reviewed.      ED Treatments / Results  Labs (all labs ordered are listed, but only abnormal results are displayed) Labs Reviewed  BASIC METABOLIC PANEL - Abnormal; Notable for the following:       Result Value   Potassium 5.3 (*)    All other  components within normal limits  CBC WITH DIFFERENTIAL/PLATELET - Abnormal; Notable for the following:    Hemoglobin 11.8 (*)    All other components within normal limits  SEDIMENTATION RATE - Abnormal; Notable for the following:    Sed Rate 38 (*)    All other components within normal limits  C-REACTIVE PROTEIN - Abnormal; Notable for the following:    CRP 8.6 (*)    All other components within normal limits  I-STAT CHEM 8, ED - Abnormal; Notable for the following:    BUN 3 (*)    Hemoglobin 10.5 (*)    HCT 31.0 (*)    All other components within normal limits    EKG  EKG Interpretation None       Radiology Dg Knee Complete 4 Views Right  Result Date: 12/11/2016 CLINICAL DATA:  Right knee pain, history of recent knee replacement, initial encounter EXAM: RIGHT KNEE - COMPLETE 4+ VIEW COMPARISON:  11/30/2016 FINDINGS: Right knee prosthesis is noted in satisfactory position. No acute effusion is seen. No fracture is noted. A few air bubbles are noted anterior to the patella which may be residual from the previous surgery. The possibility of localized infection would deserve consideration as well. IMPRESSION: Air bubbles in the subcutaneous tissues anterior and slightly superior to the patella. These may be related to the recent surgery although the possibility of localized infection deserves consideration. Electronically Signed   By: Inez Catalina M.D.   On: 12/11/2016 13:26    Procedures Procedures (including critical care time)  Medications Ordered in ED Medications  morphine 4 MG/ML injection 4 mg (not administered)  ondansetron (ZOFRAN) injection 4 mg (not administered)     Initial Impression / Assessment and Plan / ED Course  I have reviewed the triage vital signs and the nursing notes.  Pertinent labs & imaging results that were available during my care of the patient were reviewed by me and considered in my medical decision making (see chart for details).  Clinical  Course   Patient presents with hx of recent total right knee replacement with increasing swelling of right leg and redness/warmth. Xray show subcu air but likely from recently surgery can  not r.o infection. No leukocytosis. DVT study was negative. Spoke with Dr. Sharol Given who recommends obtaining CRP and ESR. Both test were elevated but he feels this is related to recent surgery. He recommends having patient follow up in the office tomorrow with Dr. Erlinda Hong. Patient is agreeable to plan. Patient is afebrile. Pt is hemodynamically stable, in NAD, & able to ambulate in the ED. Pain has been managed & has no complaints prior to dc. Pt is comfortable with above plan and is stable for discharge at this time. All questions were answered prior to disposition. Strict return precautions for f/u to the ED were discussed. Pt seen and examined by DR. Jacubowitx who is agreeable to the above plan.    Final Clinical Impressions(s) / ED Diagnoses   Final diagnoses:  Pain and swelling of right knee  Post-operative pain    New Prescriptions Discharge Medication List as of 12/11/2016  5:13 PM       Doristine Devoid, PA-C 12/12/16 2116    Orlie Dakin, MD 12/13/16 0006

## 2016-12-11 NOTE — ED Triage Notes (Signed)
Pt. Stated, I had a knee replacement on Dec. 27, on Friday it started swelling

## 2016-12-11 NOTE — ED Notes (Signed)
Patient transported to Ultrasound 

## 2016-12-11 NOTE — Discharge Instructions (Signed)
Your ultrasound shows no signs of blood clot. You need to call Dr. Roda ShuttersXu office in the morning to see him tomorrow in the office. Return to the Ed if you develop a fever, have weakness, increasing pain or for any other reason. He may use the pain medicine that he prescribed for ear surgery at home to follow-up with her primary care doctor.

## 2016-12-11 NOTE — ED Provider Notes (Signed)
Complains of painful right knee anteriorly. Worsening over the past 2 days with increased difficulty in walking. On exam patient is alert and nontoxic right lower extremity is moderately swollen at knee. Well healing surgical wound without drainage. Surrounding skin not red or warm in comparison to contralateral leg X-ray viewed by me   Doug SouSam Gaylia Kassel, MD 12/11/16 [phone]

## 2016-12-11 NOTE — ED Notes (Signed)
Dressing intact to right knee.  Redness and swelling noted to lateral and medial right knee.

## 2016-12-11 NOTE — Progress Notes (Addendum)
VASCULAR LAB PRELIMINARY  PRELIMINARY  PRELIMINARY  PRELIMINARY  Right lower extremity venous duplex completed.    Preliminary report:  There is no obvious evidence of DVT or SVT noted in the right lower extremity.   Called report to Azucena Kubayler Leaphart, PA-C  Avalie Oconnor, RVT 12/11/2016, 2:23 PM

## 2016-12-12 ENCOUNTER — Ambulatory Visit (INDEPENDENT_AMBULATORY_CARE_PROVIDER_SITE_OTHER): Payer: Self-pay | Admitting: Orthopaedic Surgery

## 2016-12-12 ENCOUNTER — Encounter (INDEPENDENT_AMBULATORY_CARE_PROVIDER_SITE_OTHER): Payer: Self-pay | Admitting: Orthopaedic Surgery

## 2016-12-12 ENCOUNTER — Inpatient Hospital Stay (INDEPENDENT_AMBULATORY_CARE_PROVIDER_SITE_OTHER): Payer: Self-pay | Admitting: Orthopaedic Surgery

## 2016-12-12 DIAGNOSIS — M1711 Unilateral primary osteoarthritis, right knee: Secondary | ICD-10-CM

## 2016-12-12 MED ORDER — ACETAMINOPHEN 500 MG PO TABS: 1000.0000 mg | ORAL_TABLET | Freq: Four times a day (QID) | ORAL | 2 refills | Status: DC | PRN

## 2016-12-12 MED ORDER — IBUPROFEN 800 MG PO TABS: 800.0000 mg | ORAL_TABLET | Freq: Three times a day (TID) | ORAL | 2 refills | Status: DC | PRN

## 2016-12-12 MED ORDER — OXYCODONE HCL 5 MG PO TABS: 5.0000 mg | ORAL_TABLET | Freq: Four times a day (QID) | ORAL | 0 refills | Status: DC | PRN

## 2016-12-12 MED ORDER — METHOCARBAMOL 500 MG PO TABS: 500.0000 mg | ORAL_TABLET | Freq: Four times a day (QID) | ORAL | 2 refills | Status: DC | PRN

## 2016-12-12 MED FILL — IBUPROFEN 800 MG TABLET: 800 | 10 days supply | Qty: 30 | Fill #0

## 2016-12-12 NOTE — Progress Notes (Signed)
Patient comes back today for her 2 week postop visit. She did have increasing pain over the weekend and went to the ER. DVT study was negative. She's been taking oxycodone and Robaxin. She is doing physical therapy. Physical exam shows a well-healed incision. There is no signs of infection. Her calf is nontender. I will like her to stop physical therapy for 1 week continue to ice aggressively I refilled her Advil Tylenol and oxycodone and Robaxin. I'll see her back in 4 weeks with 2 view x-rays of the right knee

## 2016-12-13 ENCOUNTER — Telehealth (INDEPENDENT_AMBULATORY_CARE_PROVIDER_SITE_OTHER): Payer: Self-pay | Admitting: Orthopaedic Surgery

## 2016-12-13 ENCOUNTER — Inpatient Hospital Stay (INDEPENDENT_AMBULATORY_CARE_PROVIDER_SITE_OTHER): Payer: Self-pay | Admitting: Orthopaedic Surgery

## 2016-12-13 NOTE — Telephone Encounter (Signed)
Ree KidaJack states he was going to see patient this week and then hopefully the pt can start outpatient PT next week. Ree KidaJack also would like to get the status of patients office visit from 12/12/16.

## 2016-12-14 NOTE — Telephone Encounter (Signed)
Ree KidaJack therapist  would like to know if pt will  ontinue PT or what will she be doing until she sees you again.

## 2016-12-14 NOTE — Telephone Encounter (Signed)
She is to hold off on PT for 1 week but restart on Friday

## 2016-12-15 NOTE — Telephone Encounter (Signed)
[facility]

## 2016-12-15 NOTE — Addendum Note (Signed)
Addendum  created 12/15/16 0957 by Shelton SilvasKevin D Steffen Hase, MD   Anesthesia Intra Blocks edited, Sign clinical note

## 2016-12-15 NOTE — Telephone Encounter (Signed)
Called Catherine Patrick to advise. Per Catherine Patrick patient is ready to be Referred to outpatient Pt, would you like to send her out? If so, please advise where.

## 2016-12-19 ENCOUNTER — Other Ambulatory Visit (INDEPENDENT_AMBULATORY_CARE_PROVIDER_SITE_OTHER): Payer: Self-pay

## 2016-12-19 DIAGNOSIS — M1711 Unilateral primary osteoarthritis, right knee: Secondary | ICD-10-CM

## 2016-12-19 MED FILL — METHOCARBAMOL 500 MG TABLET: 500 | 8 days supply | Qty: 30 | Fill #0

## 2016-12-19 NOTE — Addendum Note (Signed)
Encounter addended by: Huel CoventryMeredith A Julyanna Scholle, RN on: 12/19/2016  2:46 PM<BR>    Actions taken: Delete clinical note

## 2016-12-19 NOTE — Telephone Encounter (Signed)
ORDER MADE 

## 2016-12-20 ENCOUNTER — Telehealth (INDEPENDENT_AMBULATORY_CARE_PROVIDER_SITE_OTHER): Payer: Self-pay | Admitting: Orthopaedic Surgery

## 2016-12-20 NOTE — Telephone Encounter (Signed)
Pt had surgery 12/27 and stated she's having prominent headaches that don't go away, she thinks this is from  The surgery and wants to know what to do...please advise  [phone]

## 2016-12-20 NOTE — Telephone Encounter (Signed)
Please advisee 

## 2016-12-20 NOTE — Telephone Encounter (Signed)
I can't think of why she has headaches.  Shouldn't be related to surgery.  Possibly to the pain meds.

## 2016-12-22 ENCOUNTER — Inpatient Hospital Stay (INDEPENDENT_AMBULATORY_CARE_PROVIDER_SITE_OTHER): Payer: Self-pay | Admitting: Orthopaedic Surgery

## 2016-12-22 ENCOUNTER — Ambulatory Visit: Payer: Self-pay | Admitting: Physical Therapy

## 2016-12-23 NOTE — Telephone Encounter (Signed)
Called pt to advise

## 2016-12-27 ENCOUNTER — Encounter: Payer: Self-pay | Admitting: Physical Therapy

## 2016-12-27 ENCOUNTER — Ambulatory Visit: Payer: Medicaid Other | Attending: Orthopaedic Surgery | Admitting: Physical Therapy

## 2016-12-27 DIAGNOSIS — R600 Localized edema: Secondary | ICD-10-CM | POA: Insufficient documentation

## 2016-12-27 DIAGNOSIS — M6281 Muscle weakness (generalized): Secondary | ICD-10-CM | POA: Insufficient documentation

## 2016-12-27 DIAGNOSIS — R2689 Other abnormalities of gait and mobility: Secondary | ICD-10-CM | POA: Insufficient documentation

## 2016-12-27 DIAGNOSIS — M25561 Pain in right knee: Secondary | ICD-10-CM | POA: Insufficient documentation

## 2016-12-27 DIAGNOSIS — M25661 Stiffness of right knee, not elsewhere classified: Secondary | ICD-10-CM | POA: Insufficient documentation

## 2016-12-27 NOTE — Therapy (Addendum)
[facility] [address] [place], [place], [address] Phone: [phone]   Fax:  [phone]  Physical Therapy Evaluation  Patient Details  Name: Catherine Patrick MRN: [id number] Date of Birth: 08/10/1966 Referring Provider: Dr. Gershon Mussel  Encounter Date: 12/27/2016      PT End of Session - 12/27/16 0941    Visit Number 1   Date for PT Re-Evaluation 02/21/17   Authorization Type CAFA valid until 02/14/2017   PT Start Time 0845   PT Stop Time 1000   PT Time Calculation (min) 75 min   Activity Tolerance Patient tolerated treatment well   Behavior During Therapy [facility] for tasks assessed/performed      Past Medical History:  Diagnosis Date  . DJD (degenerative joint disease)   . Headache   . History of blood transfusion    2 times after child birth  . History of kidney stones    passed one in 2014- 2017  . Hypertension   . Kidney stones     Past Surgical History:  Procedure Laterality Date  . CESAREAN SECTION    . CESAREAN SECTION    . HERNIA REPAIR    . LIPOSUCTION  2002  . TOTAL KNEE ARTHROPLASTY Right 11/30/2016   Procedure: RIGHT TOTAL KNEE ARTHROPLASTY;  Surgeon: Tarry Kos, MD;  [facility]: [facility];  Service: Orthopedics;  Laterality: Right;    There were no vitals filed for this visit.       Subjective Assessment - 12/27/16 0856    Subjective Patient had a right TKR on 11/30/2016. In hospital 2 days. Patient reports PT came 3 times.  Patient went to the ER on 12/11/2016 due swelling and pain in right knee.  Patient reports she still has swelling in right leg but not the pain.  Patient walks with a walker.     Pertinent History s/P right TKR 11/30/2016   How long can you walk comfortably? little pain with walking   Patient Stated Goals increase right knee flexible, walk without a walker; return to driving   Currently in Pain? Yes   Pain Score 5    Pain Location Knee   Pain Orientation Right   Pain  Descriptors / Indicators Aching   Pain Type Surgical pain   Pain Onset 1 to 4 weeks ago   Pain Frequency Intermittent   Aggravating Factors  walking, bending, walking outside   Pain Relieving Factors rest   Multiple Pain Sites No            [facility] PT Assessment - 12/27/16 0001      Assessment   Medical Diagnosis M17.11 unilateral primary osteoarthritis, right kneee   Referring Provider Dr. Gershon Mussel   Onset Date/Surgical Date 11/30/16   Prior Therapy 3 home health visits     Precautions   Precautions None     Restrictions   Weight Bearing Restrictions No     Balance Screen   Has the patient fallen in the past 6 months No   Has the patient had a decrease in activity level because of a fear of falling?  No   Is the patient reluctant to leave their home because of a fear of falling?  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, walking     Cognition   Overall Cognitive Status Within Functional Limits for tasks assessed     Observation/Other Assessments   Skin Integrity scar on right knee has decreased mobility   Focus on Therapeutic Outcomes (FOTO)  61% limitation  goal is 41% limitation     Observation/Other Assessments-Edema    Edema Circumferential     Circumferential Edema   Circumferential - Right 44cm   Circumferential - Left  40.2cm     Posture/Postural Control   Posture/Postural Control No significant limitations     ROM /  Strength   AROM / PROM / Strength AROM;PROM;Strength     AROM   Right Knee Extension --  sitting -30   Right Knee Flexion 67  sitting 67   Left Knee Extension 0   Left Knee Flexion 105     PROM   Right Knee Extension 0   Right Knee Flexion 70   Left Knee Extension 0     Strength   Right Hip Flexion 2+/5  unable to do a SLR   Right Hip Extension 3/5   Right Hip ABduction 3/5   Right Knee Flexion 2/5   Right Knee Extension 2/5   Left Knee Flexion 2+/5   Right Ankle Plantar Flexion 2/5     Palpation   Palpation comment decreased mobility of right patella     Transfers   Transfers Sit to Supine   Sit to Supine 6: Modified independent (Device/Increase time);4: Min assist  using left leg to assist right leg     Ambulation/Gait   Ambulation/Gait Yes   Ambulation/Gait Assistance 6: Modified independent (Device/Increase time)   Assistive device Rolling walker   Gait Pattern Decreased step length - right;Decreased stride length;Decreased hip/knee flexion - right;Decreased dorsiflexion - right;Decreased weight shift to right   Stairs Yes   Stair Management Technique Step to pattern                   [facility] Adult PT Treatment/Exercise - 12/27/16 0001      Knee/Hip Exercises: Aerobic   Nustep 10 min; arms #11; seat #9 level #1     Modalities   Modalities Vasopneumatic     Vasopneumatic   Number Minutes Vasopneumatic  15 minutes   Vasopnuematic Location  Knee  right   Vasopneumatic Pressure Medium   Vasopneumatic Temperature  3 snowflakes                PT Education - 12/27/16 0941    Education provided No          PT Short Term Goals - 12/27/16 0951      PT SHORT TERM GOAL #1   Title Independent with initial HEP   Time 4   Period Weeks   Status New     PT SHORT TERM GOAL #2   Title right knee extension in sitting AROM -10 degrees due to increased strength   Time 4   Period Weeks   Status New     PT SHORT TERM GOAL #3   Title right  knee flexion PROM >/=  90 degrees to assist in returning to driving   Time 4   Period Weeks   Status New     PT SHORT TERM GOAL #4   Title right knee pain decreased >/= 25% during daily activities   Time 4   Period Weeks   Status New           PT Long Term Goals - 12/27/16 1610      PT LONG TERM GOAL #1   Title independent with HEP   Time 8   Period Weeks   Status New     PT LONG TERM GOAL #2   Title walk without a walker in the community with minimal gait deficits   Time 8   Period Weeks   Status New     PT LONG TERM GOAL #3   Title bring right leg in and out of bed without left leg helping it   Time 8   Period Weeks   Status New     PT LONG TERM GOAL #4   Title return to driving due to increased right knee flexion AROM >/= 110 degrees   Time 8   Period Weeks   Status New     PT LONG TERM GOAL #5   Title right knee strength >/= 4/5 so she is able to go up down steps with minimal difficulty using a railing   Time 8   Period Weeks     Additional Long Term Goals   Additional Long Term Goals Yes     PT LONG TERM GOAL #6   Title knee extension >/= -5 degrees so she is able to walk with no assistive device and strength increased >/= 4/5   Time 8   Period Weeks   Status New     PT LONG TERM GOAL #7   Title right knee pain decreased >/= 75% with daily activities   Time 8   Period Weeks   Status New               Plan - 12/27/16 0942    Clinical Impression Statement Patient is a 50 year old female s/p right total knee replacement on 11/30/2016 for 2 days.  She had 3 sessions of home health.  She went to the ER on 12/11/2016 due to right leg pain and swelling.  Patient reports her pain is 5/10 intermittently in right knee with walking and bending of right knee. Right knee strength is 2/5.  Right knee extension in sitting is -30 degree but in supine is 0 degrees.  Right knee flexion AROM is 67 and PROM is 70 degrees.  Decreased mobility of right patella and  scar.  Patient ambulates with a rolling walker with decreased wightbear on right, decreased right knee flexion and extension and right foot push off.  Patient unable to do SLR on the right.  Patient has weakness on right hip and ankle.  Increased swelling of right knee compared to the left.  Patient is low complex evaluation due to a stable condition and one comorbidities such a right total knee replacement on 11/30/2016.  Patient will benefit from skilled therapy to increased right knee ROM and strength to improve function.    Rehab Potential Excellent   Clinical Impairments Affecting Rehab Potential s/p right total knee replacement 11/30/2016   PT Frequency 3x / week   PT Duration 8 weeks   PT Treatment/Interventions Cryotherapy;Stair training;Gait training;Ultrasound;Moist Heat;Therapeutic activities;Therapeutic exercise;Balance training;Neuromuscular re-education;Patient/family education;Passive range of motion;Scar  mobilization;Manual techniques;Energy conservation;Vasopneumatic Device   PT Next Visit Plan nustep; scar massage; right knee and patella mobilization; weight shifting exercise; ankle plantarflexion theraband exercise; calf, hamsting and hip flexor strength; right knee ROM and strength exercise   PT Home Exercise Plan knee ROM and strength exercise; gastroc stretch;    Recommended Other Services none   Consulted and Agree with Plan of Care Patient      Patient will benefit from skilled therapeutic intervention in order to improve the following deficits and impairments:  Abnormal gait, Decreased range of motion, Difficulty walking, Increased fascial restricitons, Decreased endurance, Decreased activity tolerance, Pain, Decreased scar mobility, Impaired flexibility, Increased edema, Decreased strength, Decreased mobility  Visit Diagnosis: Muscle weakness (generalized) - Plan: PT plan of care cert/re-cert  Other abnormalities of gait and mobility - Plan: PT plan of care  cert/re-cert  Acute pain of right knee - Plan: PT plan of care cert/re-cert  Stiffness of right knee, not elsewhere classified - Plan: PT plan of care cert/re-cert  Localized edema   Problem List Patient Active Problem List   Diagnosis Date Noted  . 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 Primary osteoarthritis of both knees 11/18/2016  . HTN (hypertension) 11/10/2016  . Cervical radiculopathy 09/19/2016  . Left arm pain 09/19/2016  . Unilateral primary osteoarthritis, right knee 09/30/2015  . Left lateral ankle pain 09/30/2015  . Plantar fasciitis of left foot 12/18/2013  . 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 Hydronephrosis, right 05/30/2013  . Nephrolithiasis 05/30/2013    Eulis Fosterheryl Kadarious Dikes, PT 12/27/16 9:56 AM  Lavinia SharpsStacy Simpson, PT 01/06/17 9:08 AM Phone: [phone] Fax: [phone]  [facility] [address] [place], [place], [address] Phone: [phone]   Fax:  [phone]  Name: Gwynneth AlimentSulwa Dunphy MRN: [id number] Date of Birth: 05/14/1966

## 2016-12-28 ENCOUNTER — Ambulatory Visit: Payer: Medicaid Other | Admitting: Physical Therapy

## 2016-12-28 ENCOUNTER — Encounter: Payer: Self-pay | Admitting: Physical Therapy

## 2016-12-28 DIAGNOSIS — M6281 Muscle weakness (generalized): Secondary | ICD-10-CM | POA: Diagnosis not present

## 2016-12-28 DIAGNOSIS — M25561 Pain in right knee: Secondary | ICD-10-CM

## 2016-12-28 DIAGNOSIS — M25661 Stiffness of right knee, not elsewhere classified: Secondary | ICD-10-CM

## 2016-12-28 DIAGNOSIS — R2689 Other abnormalities of gait and mobility: Secondary | ICD-10-CM

## 2016-12-28 NOTE — Therapy (Addendum)
[facility] [address], [address] [place], [place], [address] Phone: [phone]   Fax:  [phone]  Physical Therapy Treatment  Patient Details  Name: Catherine Patrick MRN: [id number] Date of Birth: 14-Aug-1966 Referring Provider: Dr. Gershon Mussel  Encounter Date: 12/28/2016      PT End of Session - 12/28/16 1154    Visit Number 2   Date for PT Re-Evaluation 02/21/17   Authorization Type CAFA valid until 02/14/2017   PT Start Time 1150   PT Stop Time 1240   PT Time Calculation (min) 50 min   Activity Tolerance Patient tolerated treatment well   Behavior During Therapy [facility] for tasks assessed/performed      Past Medical History:  Diagnosis Date  . DJD (degenerative joint disease)   . Headache   . History of blood transfusion    2 times after child birth  . History of kidney stones    passed one in 2014- 2017  . Hypertension   . Kidney stones     Past Surgical History:  Procedure Laterality Date  . CESAREAN SECTION    . CESAREAN SECTION    . HERNIA REPAIR    . LIPOSUCTION  2002  . TOTAL KNEE ARTHROPLASTY Right 11/30/2016   Procedure: RIGHT TOTAL KNEE ARTHROPLASTY;  Surgeon: Tarry Kos, MD;  Location: [facility];  Service: Orthopedics;  Laterality: Right;    There were no vitals filed for this visit.      Subjective Assessment - 12/28/16 1153    Subjective Pt reports no pain today, states knee is feeling good.    Pertinent History s/P right TKR 11/30/2016   How long can you walk comfortably? little pain with walking   Patient Stated Goals increase right knee flexible, walk without a walker; return to driving   Currently in Pain? No/denies                         [facility] Adult PT Treatment/Exercise - 12/28/16 0001      Knee/Hip Exercises: Aerobic   Recumbent Bike L0 x 8 minutes for ROM  Therapist present to discuss treatment     Knee/Hip Exercises: Supine   Quad Sets Strengthening;Right;2 sets;10  reps   Heel Slides Strengthening;Right;2 sets;10 reps   Straight Leg Raises AAROM;Strengthening;Right;2 sets;10 reps     Modalities   Modalities Vasopneumatic     Vasopneumatic   Number Minutes Vasopneumatic  15 minutes   Vasopnuematic Location  Knee  right   Vasopneumatic Pressure Medium   Vasopneumatic Temperature  3 snowflakes     Manual Therapy   Manual Therapy Soft tissue mobilization;Edema management   Edema Management Rt knee   Soft tissue mobilization Rt quads and hamstrings at knee and surrounding ligaments                PT Education - 12/28/16 1206    Education provided Yes   Education Details Knee ROM strengthening   Person(s) Educated Patient   Methods Explanation;Demonstration;Handout   Comprehension Verbalized understanding          PT Short Term Goals - 12/27/16 0951      PT SHORT TERM GOAL #1   Title Independent with initial HEP   Time 4   Period Weeks   Status New     PT SHORT TERM GOAL #2   Title right knee extension in sitting AROM -10 degrees due to increased strength   Time 4  Period Weeks   Status New     PT SHORT TERM GOAL #3   Title right knee flexion PROM >/= 90 degrees to assist in returning to driving   Time 4   Period Weeks   Status New     PT SHORT TERM GOAL #4   Title right knee pain decreased >/= 25% during daily activities   Time 4   Period Weeks   Status New           PT Long Term Goals - 12/27/16 [id number]      PT LONG TERM GOAL #1   Title independent with HEP   Time 8   Period Weeks   Status New     PT LONG TERM GOAL #2   Title walk without a walker in the community with minimal gait deficits   Time 8   Period Weeks   Status New     PT LONG TERM GOAL #3   Title bring right leg in and out of bed without left leg helping it   Time 8   Period Weeks   Status New     PT LONG TERM GOAL #4   Title return to driving due to increased right knee flexion AROM >/= 110 degrees   Time 8   Period Weeks    Status New     PT LONG TERM GOAL #5   Title right knee strength >/= 4/5 so she is able to go up down steps with minimal difficulty using a railing   Time 8   Period Weeks     Additional Long Term Goals   Additional Long Term Goals Yes     PT LONG TERM GOAL #6   Title knee extension >/= -5 degrees so she is able to walk with no assistive device and strength increased >/= 4/5   Time 8   Period Weeks   Status New     PT LONG TERM GOAL #7   Title right knee pain decreased >/= 75% with daily activities   Time 8   Period Weeks   Status New               Plan - 12/28/16 1158    Clinical Impression Statement Pt presents one visit after evaluation using RW for amb. Able to tolerate all exercises well. Some hip cramping with straight leg raise. Pt responded well to manual massage and vasoneumatic. Pt will continue to benefit from skilled therapy for LE strenghtening and gait training.    Rehab Potential Excellent   Clinical Impairments Affecting Rehab Potential s/p right total knee replacement 11/30/2016   PT Frequency 3x / week   PT Duration 8 weeks   PT Treatment/Interventions Cryotherapy;Stair training;Gait training;Ultrasound;Moist Heat;Therapeutic activities;Therapeutic exercise;Balance training;Neuromuscular re-education;Patient/family education;Passive range of motion;Scar mobilization;Manual techniques;Energy conservation;Vasopneumatic Device   PT Next Visit Plan Bike, Rt knee strenghtening and stretching, vasoneumatic   Consulted and Agree with Plan of Care Patient      Patient will benefit from skilled therapeutic intervention in order to improve the following deficits and impairments:  Abnormal gait, Decreased range of motion, Difficulty walking, Increased fascial restricitons, Decreased endurance, Decreased activity tolerance, Pain, Decreased scar mobility, Impaired flexibility, Increased edema, Decreased strength, Decreased mobility  Visit Diagnosis: Muscle weakness  (generalized)  Other abnormalities of gait and mobility  Acute pain of right knee  Stiffness of right knee, not elsewhere classified  Localized edema   Problem List Patient Active Problem List   Diagnosis Date  Noted  . 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 Primary osteoarthritis of both knees 11/18/2016  . HTN (hypertension) 11/10/2016  . Cervical radiculopathy 09/19/2016  . Left arm pain 09/19/2016  . Unilateral primary osteoarthritis, right knee 09/30/2015  . Left lateral ankle pain 09/30/2015  . Plantar fasciitis of left foot 12/18/2013  . 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 Hydronephrosis, right 05/30/2013  . Nephrolithiasis 05/30/2013   Lavinia Sharps, PT 01/06/17 9:12 AM Phone: [phone] Fax: [phone]  Dessa Phi PTA 12/28/2016, 12:44 PM  [facility] [address], [address] [place], [place], [address] Phone: [phone]   Fax:  [phone]  Name: Shenelle Klas MRN: [id number] Date of Birth: 10/06/1966

## 2016-12-28 NOTE — Patient Instructions (Signed)
Quad Set    With other leg bent, foot flat, slowly tighten muscles on thigh of straight leg while counting out loud to ____. Repeat with other leg. Repeat ____ times. Do ____ sessions per day.  http://gt2.exer.us/276   Copyright  VHI. All rights reserved.  Bracing With Heel Slides (Supine)    With neutral spine, tighten pelvic floor and abdominals and hold. Alternating legs, slide heel to bottom. Repeat ___ times. Do ___ times a day.   Copyright  VHI. All rights reserved.  Dessa PhiKatherine Matthews, PTA 12/28/16 12:05 PM  [facility] [address], [address] [place], [place] [address] Phone # [phone] Fax [phone]

## 2016-12-29 ENCOUNTER — Telehealth (INDEPENDENT_AMBULATORY_CARE_PROVIDER_SITE_OTHER): Payer: Self-pay | Admitting: Orthopaedic Surgery

## 2016-12-29 NOTE — Telephone Encounter (Signed)
Please advise on pain meds. 

## 2016-12-29 NOTE — Telephone Encounter (Signed)
Patient is requesting pain medication refill (couldnt understand her well) cb# [phone].

## 2016-12-29 NOTE — Telephone Encounter (Signed)
Norco 7.5 #30. 1 tab po bid prn pain

## 2016-12-30 ENCOUNTER — Encounter: Payer: Self-pay | Admitting: Physical Therapy

## 2016-12-30 ENCOUNTER — Ambulatory Visit: Payer: Medicaid Other | Admitting: Physical Therapy

## 2016-12-30 DIAGNOSIS — M25661 Stiffness of right knee, not elsewhere classified: Secondary | ICD-10-CM

## 2016-12-30 DIAGNOSIS — M25561 Pain in right knee: Secondary | ICD-10-CM

## 2016-12-30 DIAGNOSIS — M6281 Muscle weakness (generalized): Secondary | ICD-10-CM

## 2016-12-30 DIAGNOSIS — R2689 Other abnormalities of gait and mobility: Secondary | ICD-10-CM

## 2016-12-30 MED ORDER — HYDROCODONE-ACETAMINOPHEN 7.5-325 MG PO TABS: ORAL_TABLET | ORAL | 0 refills | Status: DC

## 2016-12-30 MED FILL — IBUPROFEN 800 MG TABLET: 800 | 10 days supply | Qty: 30 | Fill #1

## 2016-12-30 NOTE — Telephone Encounter (Signed)
Called pt to advise rx ready for pick up at [facility] front desk, [facility]

## 2016-12-30 NOTE — Therapy (Addendum)
[facility] [address], [address] [place], [place], [address] Phone: [phone]   Fax:  [phone]  Physical Therapy Treatment  Patient Details  Name: Catherine Patrick MRN: [id number] Date of Birth: 05-18-1966 Referring Provider: Dr. Gershon Mussel  Encounter Date: 12/30/2016      PT End of Session - 12/30/16 0901    Visit Number 3   Date for PT Re-Evaluation 02/21/17   Authorization Type CAFA valid until 02/14/2017   PT Start Time 0900  Pt late   PT Stop Time 0946   PT Time Calculation (min) 46 min   Activity Tolerance Patient tolerated treatment well   Behavior During Therapy [facility] for tasks assessed/performed      Past Medical History:  Diagnosis Date  . DJD (degenerative joint disease)   . Headache   . History of blood transfusion    2 times after child birth  . History of kidney stones    passed one in 2014- 2017  . Hypertension   . Kidney stones     Past Surgical History:  Procedure Laterality Date  . CESAREAN SECTION    . CESAREAN SECTION    . HERNIA REPAIR    . LIPOSUCTION  2002  . TOTAL KNEE ARTHROPLASTY Right 11/30/2016   Procedure: RIGHT TOTAL KNEE ARTHROPLASTY;  Surgeon: Tarry Kos, MD;  Location: [facility] OR;  Service: Orthopedics;  Laterality: Right;    There were no vitals filed for this visit.      Subjective Assessment - 12/30/16 0900    Subjective Pt reports pain this morning but took a pain pill and is ok now.    Pertinent History s/P right TKR 11/30/2016   How long can you walk comfortably? little pain with walking   Patient Stated Goals increase right knee flexible, walk without a walker; return to driving   Currently in Pain? Yes   Pain Score 4    Pain Location Knee   Pain Descriptors / Indicators Aching   Pain Type Surgical pain   Pain Onset 1 to 4 weeks ago   Pain Frequency Intermittent            [facility] PT Assessment - 12/30/16 0001      PROM   Right Knee Extension 0   Right  Knee Flexion 87                     [facility] Adult PT Treatment/Exercise - 12/30/16 0001      Knee/Hip Exercises: Stretches   Passive Hamstring Stretch Both;2 reps;10 seconds   Gastroc Stretch 2 reps;Both;10 seconds     Knee/Hip Exercises: Aerobic   Recumbent Bike L0 x 8 minutes for ROM  Therapist present to discuss treatment     Knee/Hip Exercises: Supine   Quad Sets Strengthening;Right;2 sets;10 reps   Short Arc Quad Sets Strengthening;Right;2 sets;10 reps   Heel Slides Strengthening;Right;2 sets;10 reps   Straight Leg Raises --   Other Supine Knee/Hip Exercises Ball squeeze     Modalities   Modalities Vasopneumatic     Vasopneumatic   Number Minutes Vasopneumatic  15 minutes   Vasopnuematic Location  Knee  Right   Vasopneumatic Pressure Medium   Vasopneumatic Temperature  3 snowflakes     Manual Therapy   Manual Therapy Soft tissue mobilization   Manual therapy comments Scar mobilization   Edema Management Rt knee   Soft tissue mobilization Rt quads and hamstrings at knee and surrounding ligaments  PT Short Term Goals - 12/27/16 0951      PT SHORT TERM GOAL #1   Title Independent with initial HEP   Time 4   Period Weeks   Status New     PT SHORT TERM GOAL #2   Title right knee extension in sitting AROM -10 degrees due to increased strength   Time 4   Period Weeks   Status New     PT SHORT TERM GOAL #3   Title right knee flexion PROM >/= 90 degrees to assist in returning to driving   Time 4   Period Weeks   Status New     PT SHORT TERM GOAL #4   Title right knee pain decreased >/= 25% during daily activities   Time 4   Period Weeks   Status New           PT Long Term Goals - 12/27/16 7564      PT LONG TERM GOAL #1   Title independent with HEP   Time 8   Period Weeks   Status New     PT LONG TERM GOAL #2   Title walk without a walker in the community with minimal gait deficits   Time 8   Period Weeks    Status New     PT LONG TERM GOAL #3   Title bring right leg in and out of bed without left leg helping it   Time 8   Period Weeks   Status New     PT LONG TERM GOAL #4   Title return to driving due to increased right knee flexion AROM >/= 110 degrees   Time 8   Period Weeks   Status New     PT LONG TERM GOAL #5   Title right knee strength >/= 4/5 so she is able to go up down steps with minimal difficulty using a railing   Time 8   Period Weeks     Additional Long Term Goals   Additional Long Term Goals Yes     PT LONG TERM GOAL #6   Title knee extension >/= -5 degrees so she is able to walk with no assistive device and strength increased >/= 4/5   Time 8   Period Weeks   Status New     PT LONG TERM GOAL #7   Title right knee pain decreased >/= 75% with daily activities   Time 8   Period Weeks   Status New               Plan - 12/30/16 3329    Clinical Impression Statement Pt presents with RW antalgic gait due to pain. Pt able to tolerate all exercises well with good quad activation but weakness. Good ROM in extension, very limited knee flexion. Will begin to progress standing exercises and balance at next visit.    Rehab Potential Excellent   Clinical Impairments Affecting Rehab Potential s/p right total knee replacement 11/30/2016   PT Frequency 3x / week   PT Duration 8 weeks   PT Treatment/Interventions Cryotherapy;Stair training;Gait training;Ultrasound;Moist Heat;Therapeutic activities;Therapeutic exercise;Balance training;Neuromuscular re-education;Patient/family education;Passive range of motion;Scar mobilization;Manual techniques;Energy conservation;Vasopneumatic Device   PT Next Visit Plan Bike, standing exercises   Consulted and Agree with Plan of Care Patient      Patient will benefit from skilled therapeutic intervention in order to improve the following deficits and impairments:  Abnormal gait, Decreased range of motion, Difficulty walking,  Increased fascial restricitons, Decreased  endurance, Decreased activity tolerance, Pain, Decreased scar mobility, Impaired flexibility, Increased edema, Decreased strength, Decreased mobility  Visit Diagnosis: Muscle weakness (generalized)  Other abnormalities of gait and mobility  Acute pain of right knee  Stiffness of right knee, not elsewhere classified   Localized edema  Problem List Patient Active Problem List   Diagnosis Date Noted  . 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 Primary osteoarthritis of both knees 11/18/2016  . HTN (hypertension) 11/10/2016  . Cervical radiculopathy 09/19/2016  . Left arm pain 09/19/2016  . Unilateral primary osteoarthritis, right knee 09/30/2015  . Left lateral ankle pain 09/30/2015  . Plantar fasciitis of left foot 12/18/2013  . 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 Hydronephrosis, right 05/30/2013  . Nephrolithiasis 05/30/2013   Lavinia SharpsStacy Simpson, PT 01/06/17 9:13 AM Phone: [phone] Fax: [phone]  Dessa PhiKatherine Matthews PTA 12/30/2016, 10:02 AM  [facility] [address], [address] [place], [place], [address] Phone: [phone]   Fax:  [phone]  Name: Gwynneth AlimentSulwa Gimbel MRN: [id number] Date of Birth: 10/17/1966

## 2016-12-30 NOTE — Telephone Encounter (Signed)
rx will be ready for pick up after 1 pm when Dr Roda ShuttersXu signs it

## 2017-01-02 ENCOUNTER — Ambulatory Visit: Payer: Medicaid Other | Admitting: Physical Therapy

## 2017-01-02 ENCOUNTER — Encounter: Payer: Self-pay | Admitting: Physical Therapy

## 2017-01-02 DIAGNOSIS — R2689 Other abnormalities of gait and mobility: Secondary | ICD-10-CM

## 2017-01-02 DIAGNOSIS — M6281 Muscle weakness (generalized): Secondary | ICD-10-CM | POA: Diagnosis not present

## 2017-01-02 DIAGNOSIS — M25661 Stiffness of right knee, not elsewhere classified: Secondary | ICD-10-CM

## 2017-01-02 DIAGNOSIS — M25561 Pain in right knee: Secondary | ICD-10-CM

## 2017-01-02 NOTE — Therapy (Addendum)
[facility] [address], [address] [place], [place], [address] Phone: [phone]   Fax:  [phone]  Physical Therapy Treatment  Patient Details  Name: Catherine AlimentSulwa Patrick MRN: [id number] Date of Birth: 02/03/1966 Referring Provider: Dr. Gershon MusselNaiping Xu  Encounter Date: 01/02/2017      PT End of Session - 01/02/17 0858    Visit Number 4   Date for PT Re-Evaluation 02/21/17   Authorization Type CAFA valid until 02/14/2017   PT Start Time 0857  Pt late   PT Stop Time 0943   PT Time Calculation (min) 46 min   Activity Tolerance Patient tolerated treatment well   Behavior During Therapy [facility] for tasks assessed/performed      Past Medical History:  Diagnosis Date  . DJD (degenerative joint disease)   . Headache   . History of blood transfusion    2 times after child birth  . History of kidney stones    passed one in 2014- 2017  . Hypertension   . Kidney stones     Past Surgical History:  Procedure Laterality Date  . CESAREAN SECTION    . CESAREAN SECTION    . HERNIA REPAIR    . LIPOSUCTION  2002  . TOTAL KNEE ARTHROPLASTY Right 11/30/2016   Procedure: RIGHT TOTAL KNEE ARTHROPLASTY;  Surgeon: Tarry KosNaiping M Xu, MD;  Location: [facility] OR;  Service: Orthopedics;  Laterality: Right;    There were no vitals filed for this visit.      Subjective Assessment - 01/02/17 0857    Subjective Pt reports some pain this morning but took pain pill and is feeling better.   Pertinent History s/P right TKR 11/30/2016   How long can you walk comfortably? little pain with walking   Currently in Pain? Yes   Pain Score 4    Pain Location Knee   Pain Orientation Right   Pain Descriptors / Indicators Aching   Pain Type Surgical pain   Pain Onset 1 to 4 weeks ago   Pain Frequency Intermittent   Aggravating Factors  walking, beding, walking outisde   Pain Relieving Factors rest   Multiple Pain Sites No                         [facility]  Adult PT Treatment/Exercise - 01/02/17 0001      Knee/Hip Exercises: Stretches   Passive Hamstring Stretch Both;2 reps;10 seconds  Ankle propped   Gastroc Stretch 2 reps;Both;10 seconds  with strap     Knee/Hip Exercises: Aerobic   Recumbent Bike L0 x 8 minutes for ROM  Therapist present to discuss treatment     Knee/Hip Exercises: Standing   Heel Raises Both;2 sets;10 reps   Knee Flexion Strengthening;Right;10 reps;1 set   Hip Abduction Stengthening;Both;2 sets;10 reps   Hip Extension Stengthening;Both;2 sets;10 reps   Functional Squat --     Knee/Hip Exercises: Supine   Heel Slides Strengthening;Right;2 sets;10 reps     Modalities   Modalities Vasopneumatic     Vasopneumatic   Number Minutes Vasopneumatic  15 minutes   Vasopnuematic Location  Knee  Right   Vasopneumatic Pressure Medium   Vasopneumatic Temperature  3 snowflakes                  PT Short Term Goals - 01/02/17 [id number]      PT SHORT TERM GOAL #1   Title Independent with initial HEP   Time 4   Period  Weeks   Status On-going     PT SHORT TERM GOAL #2   Title right knee extension in sitting AROM -10 degrees due to increased strength   Time 4   Period Weeks   Status On-going     PT SHORT TERM GOAL #3   Title right knee flexion PROM >/= 90 degrees to assist in returning to driving   Time 4   Period Weeks   Status On-going     PT SHORT TERM GOAL #4   Title right knee pain decreased >/= 25% during daily activities   Time 4   Period Weeks   Status On-going           PT Long Term Goals - 01/02/17 1610      PT LONG TERM GOAL #1   Title independent with HEP   Time 8   Period Weeks   Status On-going     PT LONG TERM GOAL #2   Title walk without a walker in the community with minimal gait deficits   Time 8   Period Weeks   Status On-going     PT LONG TERM GOAL #3   Title bring right leg in and out of bed without left leg helping it   Time 8   Period Weeks   Status On-going      PT LONG TERM GOAL #4   Title return to driving due to increased right knee flexion AROM >/= 110 degrees   Time 8   Period Weeks   Status On-going     PT LONG TERM GOAL #5   Title right knee strength >/= 4/5 so she is able to go up down steps with minimal difficulty using a railing   Time 8   Period Weeks   Status On-going     PT LONG TERM GOAL #6   Title knee extension >/= -5 degrees so she is able to walk with no assistive device and strength increased >/= 4/5   Time 8   Period Weeks   Status On-going     PT LONG TERM GOAL #7   Title right knee pain decreased >/= 75% with daily activities   Time 8   Period Weeks   Status On-going               Plan - 01/02/17 0903    Clinical Impression Statement Pt continues to progress with Rt knee strength and ROM. Able to tolerate all standing exercises well with some discomfort. Pt continues to be limited with knee flexion but doing well with knee extension. Will continue to progress with knee strength and stability.    Rehab Potential Excellent   Clinical Impairments Affecting Rehab Potential s/p right total knee replacement 11/30/2016   PT Frequency 3x / week   PT Duration 8 weeks   PT Treatment/Interventions Cryotherapy;Stair training;Gait training;Ultrasound;Moist Heat;Therapeutic activities;Therapeutic exercise;Balance training;Neuromuscular re-education;Patient/family education;Passive range of motion;Scar mobilization;Manual techniques;Energy conservation;Vasopneumatic Device   PT Next Visit Plan Progress standing exercises and knee ROM; gait training with cane   Consulted and Agree with Plan of Care Patient      Patient will benefit from skilled therapeutic intervention in order to improve the following deficits and impairments:  Abnormal gait, Decreased range of motion, Difficulty walking, Increased fascial restricitons, Decreased endurance, Decreased activity tolerance, Pain, Decreased scar mobility, Impaired  flexibility, Increased edema, Decreased strength, Decreased mobility  Visit Diagnosis: Muscle weakness (generalized)  Other abnormalities of gait and mobility  Acute pain of  right knee  Stiffness of right knee, not elsewhere classified  Localized edema   Problem List Patient Active Problem List   Diagnosis Date Noted  . 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 Primary osteoarthritis of both knees 11/18/2016  . HTN (hypertension) 11/10/2016  . Cervical radiculopathy 09/19/2016  . Left arm pain 09/19/2016  . Unilateral primary osteoarthritis, right knee 09/30/2015  . Left lateral ankle pain 09/30/2015  . Plantar fasciitis of left foot 12/18/2013  . 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 Hydronephrosis, right 05/30/2013  . Nephrolithiasis 05/30/2013   Lavinia Sharps, PT 01/06/17 9:14 AM Phone: [phone] Fax: [phone]  Dessa Phi PTA 01/02/2017, 10:29 AM  [facility] [address], [address] [place], [place], [address] Phone: [phone]   Fax:  [phone]  Name: Catherine Patrick MRN: [id number] Date of Birth: 1966/08/23

## 2017-01-04 ENCOUNTER — Telehealth (INDEPENDENT_AMBULATORY_CARE_PROVIDER_SITE_OTHER): Payer: Self-pay | Admitting: Orthopaedic Surgery

## 2017-01-04 ENCOUNTER — Ambulatory Visit: Payer: Medicaid Other | Admitting: Physical Therapy

## 2017-01-04 ENCOUNTER — Encounter (INDEPENDENT_AMBULATORY_CARE_PROVIDER_SITE_OTHER): Payer: Self-pay

## 2017-01-04 ENCOUNTER — Encounter: Payer: Self-pay | Admitting: Physical Therapy

## 2017-01-04 DIAGNOSIS — M6281 Muscle weakness (generalized): Secondary | ICD-10-CM

## 2017-01-04 DIAGNOSIS — M25561 Pain in right knee: Secondary | ICD-10-CM

## 2017-01-04 DIAGNOSIS — R2689 Other abnormalities of gait and mobility: Secondary | ICD-10-CM

## 2017-01-04 DIAGNOSIS — M25661 Stiffness of right knee, not elsewhere classified: Secondary | ICD-10-CM

## 2017-01-04 NOTE — Telephone Encounter (Signed)
[facility] at [facility] front desk. Called pt

## 2017-01-04 NOTE — Telephone Encounter (Signed)
If yes,  please let me know what the letter needs to say. Thanks.

## 2017-01-04 NOTE — Telephone Encounter (Signed)
Please write that patient has had a knee replacement on whatever date she had it on and that she would benefit from her sister's assistance for 6 months postoperatively.

## 2017-01-04 NOTE — Telephone Encounter (Signed)
Patient would like for Dr Roda ShuttersXu to write a letter for the [facility] stating she will need help after surgery in order for her sister to be able to come to [place].

## 2017-01-04 NOTE — Therapy (Addendum)
[facility] [address], [address] [place], [place], [address] Phone: [phone]   Fax:  [phone]  Physical Therapy Treatment  Patient Details  Name: Catherine Patrick MRN: [id number] Date of Birth: October 28, 1966 Referring Provider: Dr. Gershon Mussel  Encounter Date: 01/04/2017      PT End of Session - 01/04/17 1140    Visit Number 5   Date for PT Re-Evaluation 02/21/17   Authorization Type CAFA valid until 02/14/2017   PT Start Time 1138   PT Stop Time 1233   PT Time Calculation (min) 55 min   Activity Tolerance Patient tolerated treatment well   Behavior During Therapy [facility] for tasks assessed/performed      Past Medical History:  Diagnosis Date  . DJD (degenerative joint disease)   . Headache   . History of blood transfusion    2 times after child birth  . History of kidney stones    passed one in 2014- 2017  . Hypertension   . Kidney stones     Past Surgical History:  Procedure Laterality Date  . CESAREAN SECTION    . CESAREAN SECTION    . HERNIA REPAIR    . LIPOSUCTION  2002  . TOTAL KNEE ARTHROPLASTY Right 11/30/2016   Procedure: RIGHT TOTAL KNEE ARTHROPLASTY;  Surgeon: Tarry Kos, MD;  [facility]: [facility];  Service: Orthopedics;  Laterality: Right;    There were no vitals filed for this visit.      Subjective Assessment - 01/04/17 1139    Subjective Pt reports knee feeling good. Pt reports no pain at the moment. Lt knee hurting worse than Right.  Pt drove self to therapy.    Pertinent History s/P right TKR 11/30/2016   How long can you walk comfortably? little pain with walking   Patient Stated Goals increase right knee flexible, walk without a walker; return to driving   Currently in Pain? No/denies   Pain Score 0-No pain                         [facility] Adult PT Treatment/Exercise - 01/04/17 0001      Knee/Hip Exercises: Stretches   Passive Hamstring Stretch Both;2 reps;10 seconds  Ankle  propped   Gastroc Stretch 2 reps;Both;10 seconds  with strap     Knee/Hip Exercises: Aerobic   Recumbent Bike L0 x 8 minutes for ROM  Therapist present to discuss treatment     Knee/Hip Exercises: Standing   Heel Raises Both;2 sets;10 reps   Knee Flexion Strengthening;Right;10 reps;1 set   Hip Abduction Stengthening;Both;2 sets;10 reps   Hip Extension Stengthening;Both;2 sets;10 reps   Forward Step Up Both;2 sets;10 reps   Gait Training Walking with cane  Navigating stairs     Knee/Hip Exercises: Seated   Long Arc Quad Strengthening;Both;2 sets;10 reps   Abduction/Adduction  Strengthening;Both;20 reps   Sit to [employer] 10 reps     Knee/Hip Exercises: Supine   Heel Slides AAROM;Right;10 reps     Modalities   Modalities Vasopneumatic     Vasopneumatic   Number Minutes Vasopneumatic  15 minutes   Vasopnuematic Location  Knee  Right   Vasopneumatic Pressure Medium   Vasopneumatic Temperature  3 snowflakes                  PT Short Term Goals - 01/04/17 1141      PT SHORT TERM GOAL #1   Title Independent with initial HEP  Time 4   Period Weeks   Status On-going     PT SHORT TERM GOAL #2   Title right knee extension in sitting AROM -10 degrees due to increased strength   Time 4   Period Weeks   Status On-going     PT SHORT TERM GOAL #3   Title right knee flexion PROM >/= 90 degrees to assist in returning to driving   Time 4   Period Weeks   Status On-going     PT SHORT TERM GOAL #4   Title right knee pain decreased >/= 25% during daily activities   Time 4   Period Weeks   Status On-going           PT Long Term Goals - 01/04/17 1141      PT LONG TERM GOAL #1   Title independent with HEP   Time 8   Period Weeks   Status On-going     PT LONG TERM GOAL #2   Title walk without a walker in the community with minimal gait deficits   Time 8   Period Weeks   Status On-going     PT LONG TERM GOAL #3   Title bring right leg in and out of bed  without left leg helping it   Time 8   Period Weeks   Status On-going     PT LONG TERM GOAL #4   Title return to driving due to increased right knee flexion AROM >/= 110 degrees   Time 8   Period Weeks   Status On-going     PT LONG TERM GOAL #5   Title right knee strength >/= 4/5 so she is able to go up down steps with minimal difficulty using a railing   Time 8   Period Weeks   Status On-going     PT LONG TERM GOAL #6   Title knee extension >/= -5 degrees so she is able to walk with no assistive device and strength increased >/= 4/5   Time 8   Period Weeks   Status On-going     PT LONG TERM GOAL #7   Title right knee pain decreased >/= 75% with daily activities   Time 8   Period Weeks   Status On-going               Plan - 01/04/17 1221    Clinical Impression Statement Pt presents with straight cane, not using walker anymore. Therapist adjusted hight for cane and educated patient on amb with cane. Education on going up and down stairs safely at home and in community. Pt able to tolerate all strengthening exercises well. Continue to progress with ROM in flexion.    Rehab Potential Excellent   Clinical Impairments Affecting Rehab Potential s/p right total knee replacement 11/30/2016   PT Frequency 3x / week   PT Duration 8 weeks   PT Treatment/Interventions Cryotherapy;Stair training;Gait training;Ultrasound;Moist Heat;Therapeutic activities;Therapeutic exercise;Balance training;Neuromuscular re-education;Patient/family education;Passive range of motion;Scar mobilization;Manual techniques;Energy conservation;Vasopneumatic Device   PT Next Visit Plan Continue LE strength and balance, flexion ROM   Consulted and Agree with Plan of Care Patient      Patient will benefit from skilled therapeutic intervention in order to improve the following deficits and impairments:  Abnormal gait, Decreased range of motion, Difficulty walking, Increased fascial restricitons, Decreased  endurance, Decreased activity tolerance, Pain, Decreased scar mobility, Impaired flexibility, Increased edema, Decreased strength, Decreased mobility  Visit Diagnosis: Muscle weakness (generalized)  Other abnormalities  of gait and mobility  Acute pain of right knee  Stiffness of right knee, not elsewhere classified  Localized edema   Problem List Patient Active Problem List   Diagnosis Date Noted  . 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 Primary osteoarthritis of both knees 11/18/2016  . HTN (hypertension) 11/10/2016  . Cervical radiculopathy 09/19/2016  . Left arm pain 09/19/2016  . Unilateral primary osteoarthritis, right knee 09/30/2015  . Left lateral ankle pain 09/30/2015  . Plantar fasciitis of left foot 12/18/2013  . 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 Hydronephrosis, right 05/30/2013  . Nephrolithiasis 05/30/2013   Lavinia SharpsStacy Simpson, PT 01/06/17 9:16 AM Phone: [phone] Fax: [phone]  Dessa PhiKatherine Kelechi Astarita PTA 01/04/2017, 12:41 PM  [facility] [address], [address] [place], [place], [address] Phone: [phone]   Fax:  [phone]  Name: Catherine Patrick MRN: [id number] Date of Birth: 09/28/1966

## 2017-01-06 ENCOUNTER — Encounter: Payer: Self-pay | Admitting: Physical Therapy

## 2017-01-06 ENCOUNTER — Ambulatory Visit: Payer: Medicaid Other | Attending: Orthopaedic Surgery | Admitting: Physical Therapy

## 2017-01-06 DIAGNOSIS — M6281 Muscle weakness (generalized): Secondary | ICD-10-CM | POA: Insufficient documentation

## 2017-01-06 DIAGNOSIS — R2689 Other abnormalities of gait and mobility: Secondary | ICD-10-CM | POA: Diagnosis present

## 2017-01-06 DIAGNOSIS — M25661 Stiffness of right knee, not elsewhere classified: Secondary | ICD-10-CM | POA: Diagnosis present

## 2017-01-06 DIAGNOSIS — R600 Localized edema: Secondary | ICD-10-CM | POA: Diagnosis present

## 2017-01-06 DIAGNOSIS — M25561 Pain in right knee: Secondary | ICD-10-CM

## 2017-01-06 NOTE — Therapy (Signed)
[facility] [address], [address] [place], [place], [address] Phone: [phone]   Fax:  [phone]  Physical Therapy Treatment  Patient Details  Name: Catherine AlimentSulwa Deemer MRN: [id number] Date of Birth: 07/03/1966 Referring Provider: Dr. Gershon MusselNaiping Xu  Encounter Date: 01/06/2017      PT End of Session - 01/06/17 0857    Visit Number 6   Date for PT Re-Evaluation 02/21/17   Authorization Type CAFA valid until 02/14/2017   PT Start Time 0855   PT Stop Time 0955   PT Time Calculation (min) 60 min   Activity Tolerance Patient tolerated treatment well   Behavior During Therapy [facility] for tasks assessed/performed      Past Medical History:  Diagnosis Date  . DJD (degenerative joint disease)   . Headache   . History of blood transfusion    2 times after child birth  . History of kidney stones    passed one in 2014- 2017  . Hypertension   . Kidney stones     Past Surgical History:  Procedure Laterality Date  . CESAREAN SECTION    . CESAREAN SECTION    . HERNIA REPAIR    . LIPOSUCTION  2002  . TOTAL KNEE ARTHROPLASTY Right 11/30/2016   Procedure: RIGHT TOTAL KNEE ARTHROPLASTY;  Surgeon: Tarry KosNaiping M Xu, MD;  Location: [facility];  Service: Orthopedics;  Laterality: Right;    There were no vitals filed for this visit.      Subjective Assessment - 01/06/17 0858    Subjective Pt reports no pain this AM. Did take pain medicine before coming.   Currently in Pain? No/denies            [facility] PT Assessment - 01/06/17 0001      PROM   Right Knee Extension 0   Right Knee Flexion 100                     [facility] Adult PT Treatment/Exercise - 01/06/17 0001      Knee/Hip Exercises: Aerobic   Recumbent Bike L0 Rocking x 10 min  Review of status with pt     Knee/Hip Exercises: Standing   Forward Step Up Right;2 sets;10 reps;Hand Hold: 2;Step Height: 6"  VC more LE vs arms   Rebounder weight shifting 3 ways 1 min each     Knee/Hip Exercises: Seated   Long Arc Quad Strengthening;Right;2 sets;10 reps  Added ball squeeze and 2#   Sit to [employer] 1 set;10 reps;without UE support  LT knee creptius     Vasopneumatic   Number Minutes Vasopneumatic  15 minutes   Vasopnuematic Location  Knee  Right   Vasopneumatic Pressure Medium   Vasopneumatic Temperature  3 snowflakes     Manual Therapy   Edema Management Retrograde massge RTLE   Soft tissue mobilization RT quads, lateral knee and scar                  PT Short Term Goals - 01/04/17 1141      PT SHORT TERM GOAL #1   Title Independent with initial HEP   Time 4   Period Weeks   Status On-going     PT SHORT TERM GOAL #2   Title right knee extension in sitting AROM -10 degrees due to increased strength   Time 4   Period Weeks   Status On-going     PT SHORT TERM GOAL #3   Title right knee flexion PROM >/=  90 degrees to assist in returning to driving   Time 4   Period Weeks   Status On-going     PT SHORT TERM GOAL #4   Title right knee pain decreased >/= 25% during daily activities   Time 4   Period Weeks   Status On-going           PT Long Term Goals - 01/04/17 1141      PT LONG TERM GOAL #1   Title independent with HEP   Time 8   Period Weeks   Status On-going     PT LONG TERM GOAL #2   Title walk without a walker in the community with minimal gait deficits   Time 8   Period Weeks   Status On-going     PT LONG TERM GOAL #3   Title bring right leg in and out of bed without left leg helping it   Time 8   Period Weeks   Status On-going     PT LONG TERM GOAL #4   Title return to driving due to increased right knee flexion AROM >/= 110 degrees   Time 8   Period Weeks   Status On-going     PT LONG TERM GOAL #5   Title right knee strength >/= 4/5 so she is able to go up down steps with minimal difficulty using a railing   Time 8   Period Weeks   Status On-going     PT LONG TERM GOAL #6   Title knee extension >/=  -5 degrees so she is able to walk with no assistive device and strength increased >/= 4/5   Time 8   Period Weeks   Status On-going     PT LONG TERM GOAL #7   Title right knee pain decreased >/= 75% with daily activities   Time 8   Period Weeks   Status On-going               Plan - 01/06/17 1610    Clinical Impression Statement Weakness in quad as demonstrated by how pt perform her step up exercise. Knee active flexion ROM improved nicely since eval. Mild swelling in both knees. Pt reports pain wise her LT knee bothers her more at this time.    Rehab Potential Excellent   Clinical Impairments Affecting Rehab Potential s/p right total knee replacement 11/30/2016   PT Frequency 3x / week   PT Duration 8 weeks   PT Treatment/Interventions Cryotherapy;Stair training;Gait training;Ultrasound;Moist Heat;Therapeutic activities;Therapeutic exercise;Balance training;Neuromuscular re-education;Patient/family education;Passive range of motion;Scar mobilization;Manual techniques;Energy conservation;Vasopneumatic Device   PT Next Visit Plan quad strength, soft tissue work to [employer]  with MFR peripatella, game ready for swelling   Consulted and Agree with Plan of Care Patient      Patient will benefit from skilled therapeutic intervention in order to improve the following deficits and impairments:  Abnormal gait, Decreased range of motion, Difficulty walking, Increased fascial restricitons, Decreased endurance, Decreased activity tolerance, Pain, Decreased scar mobility, Impaired flexibility, Increased edema, Decreased strength, Decreased mobility  Visit Diagnosis: Muscle weakness (generalized)  Other abnormalities of gait and mobility  Acute pain of right knee  Stiffness of right knee, not elsewhere classified  Localized edema     Problem List Patient Active Problem List   Diagnosis Date Noted  . 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 Primary osteoarthritis of both knees  11/18/2016  . HTN (hypertension) 11/10/2016  . Cervical radiculopathy 09/19/2016  . Left arm  pain 09/19/2016  . Unilateral primary osteoarthritis, right knee 09/30/2015  . Left lateral ankle pain 09/30/2015  . Plantar fasciitis of left foot 12/18/2013  . 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 Hydronephrosis, right 05/30/2013  . Nephrolithiasis 05/30/2013    Anice Wilshire, PTA 01/06/2017, 9:42 AM  [facility] [address], [address] [place], [place], [address] Phone: [phone]   Fax:  [phone]  Name: Natasia Sanko MRN: [id number] Date of Birth: [birth year]

## 2017-01-09 ENCOUNTER — Ambulatory Visit: Payer: Medicaid Other | Admitting: Physical Therapy

## 2017-01-09 ENCOUNTER — Encounter: Payer: Self-pay | Admitting: Physical Therapy

## 2017-01-09 DIAGNOSIS — M25561 Pain in right knee: Secondary | ICD-10-CM

## 2017-01-09 DIAGNOSIS — R2689 Other abnormalities of gait and mobility: Secondary | ICD-10-CM

## 2017-01-09 DIAGNOSIS — R600 Localized edema: Secondary | ICD-10-CM

## 2017-01-09 DIAGNOSIS — M25661 Stiffness of right knee, not elsewhere classified: Secondary | ICD-10-CM

## 2017-01-09 DIAGNOSIS — M6281 Muscle weakness (generalized): Secondary | ICD-10-CM | POA: Diagnosis not present

## 2017-01-09 NOTE — Therapy (Signed)
[facility] [address], [place], [place], [address] Phone: [phone]   Fax:  [phone]  Physical Therapy Treatment  Patient Details  Name: Catherine Patrick MRN: [id number] Date of Birth: [birth year] Referring Provider: Dr. Frankey Shown  Encounter Date: 01/09/2017      PT End of Session - 01/09/17 0901    Visit Number 7   Date for PT Re-Evaluation 02/21/17   Authorization Type CAFA valid until 02/14/2017   PT Start Time 0853   PT Stop Time 0946   PT Time Calculation (min) 53 min   Activity Tolerance --   Behavior During Therapy --      Past Medical History:  Diagnosis Date  . DJD (degenerative joint disease)   . Headache   . History of blood transfusion    2 times after child birth  . History of kidney stones    passed one in 2014- 2017  . Hypertension   . Kidney stones     Past Surgical History:  Procedure Laterality Date  . CESAREAN SECTION    . CESAREAN SECTION    . HERNIA REPAIR    . LIPOSUCTION  2002  . TOTAL KNEE ARTHROPLASTY Right 11/30/2016   Procedure: RIGHT TOTAL KNEE ARTHROPLASTY;  Surgeon: Leandrew Koyanagi, MD;  Location: [facility];  Service: Orthopedics;  Laterality: Right;    There were no vitals filed for this visit.      Subjective Assessment - 01/09/17 0902    Subjective No new complaints. Felt ok after last session.   Currently in Pain? No/denies   Multiple Pain Sites No                         [facility] Adult PT Treatment/Exercise - 01/09/17 0001      Knee/Hip Exercises: Aerobic   Recumbent Bike L0 x10 min  was able to make full revolution today but slow, some comps      Knee/Hip Exercises: Machines for Strengthening   Total Gym Leg Press Seat 5: RTLE 30# 10x     Knee/Hip Exercises: Standing   Lateral Step Up Right;1 set;10 reps;Hand Hold: 1;Step Height: 6"   Forward Step Up Right;2 sets;10 reps;Hand Hold: 1;Step Height: 6"   Rebounder weight shifting 3 ways 1 min  each  No UE     Knee/Hip Exercises: Seated   Long Arc Quad Strengthening;Right;3 sets;10 reps;Weights   Long Arc Quad Weight 2 lbs.     Moist Heat Therapy   Number Minutes Moist Heat 15 Minutes   Moist Heat Location --  Rt knee     Manual Therapy   Edema Management Retrograde massge RTLE   Soft tissue mobilization RT quads, lateral knee and scar                  PT Short Term Goals - 01/09/17 4709      PT SHORT TERM GOAL #1   Title Independent with initial HEP   Time 4   Period Weeks   Status Achieved     PT SHORT TERM GOAL #2   Title right knee extension in sitting AROM -10 degrees due to increased strength   Time 4   Period Weeks   Status Achieved     PT SHORT TERM GOAL #3   Title right knee flexion PROM >/= 90 degrees to assist in returning to driving   Time 4   Period Weeks   Status Achieved  PT SHORT TERM GOAL #4   Title right knee pain decreased >/= 25% during daily activities   Time 4   Period Weeks   Status Achieved  75%           PT Long Term Goals - 01/04/17 1141      PT LONG TERM GOAL #1   Title independent with HEP   Time 8   Period Weeks   Status On-going     PT LONG TERM GOAL #2   Title walk without a walker in the community with minimal gait deficits   Time 8   Period Weeks   Status On-going     PT LONG TERM GOAL #3   Title bring right leg in and out of bed without left leg helping it   Time 8   Period Weeks   Status On-going     PT LONG TERM GOAL #4   Title return to driving due to increased right knee flexion AROM >/= 110 degrees   Time 8   Period Weeks   Status On-going     PT LONG TERM GOAL #5   Title right knee strength >/= 4/5 so she is able to go up down steps with minimal difficulty using a railing   Time 8   Period Weeks   Status On-going     PT LONG TERM GOAL #6   Title knee extension >/= -5 degrees so she is able to walk with no assistive device and strength increased >/= 4/5   Time 8   Period  Weeks   Status On-going     PT LONG TERM GOAL #7   Title right knee pain decreased >/= 75% with daily activities   Time 8   Period Weeks   Status On-going               Plan - 01/09/17 0901    Clinical Impression Statement All short term goals met today, ROM improving and strength is also improving. Pt was able to perform RTLE leg press today, pt can not do with LT as it hurts the knee.    Rehab Potential Excellent   Clinical Impairments Affecting Rehab Potential s/p right total knee replacement 11/30/2016   PT Frequency 3x / week   PT Duration 8 weeks   Consulted and Agree with Plan of Care Patient      Patient will benefit from skilled therapeutic intervention in order to improve the following deficits and impairments:  Abnormal gait, Decreased range of motion, Difficulty walking, Increased fascial restricitons, Decreased endurance, Decreased activity tolerance, Pain, Decreased scar mobility, Impaired flexibility, Increased edema, Decreased strength, Decreased mobility  Visit Diagnosis: Muscle weakness (generalized)  Other abnormalities of gait and mobility  Acute pain of right knee  Stiffness of right knee, not elsewhere classified  Localized edema     Problem List Patient Active Problem List   Diagnosis Date Noted  . 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 Primary osteoarthritis of both knees 11/18/2016  . HTN (hypertension) 11/10/2016  . Cervical radiculopathy 09/19/2016  . Left arm pain 09/19/2016  . Unilateral primary osteoarthritis, right knee 09/30/2015  . Left lateral ankle pain 09/30/2015  . Plantar fasciitis of left foot 12/18/2013  . 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 Hydronephrosis, right 05/30/2013  . Nephrolithiasis 05/30/2013    Patrick,Catherine, PTA 01/09/2017, 3:18 PM  [facility] [address], [place], [place],  [address] Phone: [phone]   Fax:  [phone]  Name: Catherine Patrick MRN: [id number] Date of Birth: 1966-10-27

## 2017-01-10 ENCOUNTER — Ambulatory Visit (INDEPENDENT_AMBULATORY_CARE_PROVIDER_SITE_OTHER): Payer: Self-pay | Admitting: Orthopaedic Surgery

## 2017-01-10 ENCOUNTER — Ambulatory Visit (INDEPENDENT_AMBULATORY_CARE_PROVIDER_SITE_OTHER): Payer: MEDICAID

## 2017-01-10 DIAGNOSIS — M1711 Unilateral primary osteoarthritis, right knee: Secondary | ICD-10-CM

## 2017-01-10 NOTE — Progress Notes (Signed)
7 week postop right TKA.  Doing well.  Very happy with progress.  Doing outpatient PT.  xrays show stable TKA.  ROM is 0-100.  F/u 6 weeks for recheck.  Dental prophylaxis reinforced.  Patient may want to do left TKA in about 6 weeks.  D/c aspirin.  May drive.

## 2017-01-11 ENCOUNTER — Ambulatory Visit: Payer: Medicaid Other | Admitting: Physical Therapy

## 2017-01-11 ENCOUNTER — Encounter: Payer: Self-pay | Admitting: Physical Therapy

## 2017-01-11 DIAGNOSIS — R600 Localized edema: Secondary | ICD-10-CM

## 2017-01-11 DIAGNOSIS — R2689 Other abnormalities of gait and mobility: Secondary | ICD-10-CM

## 2017-01-11 DIAGNOSIS — M6281 Muscle weakness (generalized): Secondary | ICD-10-CM | POA: Diagnosis not present

## 2017-01-11 DIAGNOSIS — M25661 Stiffness of right knee, not elsewhere classified: Secondary | ICD-10-CM

## 2017-01-11 DIAGNOSIS — M25561 Pain in right knee: Secondary | ICD-10-CM

## 2017-01-11 NOTE — Therapy (Signed)
[facility] [address] [place], [place], [address] Phone: [phone]   Fax:  [phone]  Physical Therapy Treatment  Patient Details  Name: Catherine Patrick MRN: [id number] Date of Birth: 11-30-1966 Referring Provider: Dr. Gershon Mussel  Encounter Date: 01/11/2017      PT End of Session - 01/11/17 1151    Visit Number 8   Date for PT Re-Evaluation 02/21/17   Authorization Type CAFA valid until 02/14/2017   PT Start Time 1149   PT Stop Time 1240   PT Time Calculation (min) 51 min   Activity Tolerance Patient tolerated treatment well   Behavior During Therapy [facility] for tasks assessed/performed      Past Medical History:  Diagnosis Date  . DJD (degenerative joint disease)   . Headache   . History of blood transfusion    2 times after child birth  . History of kidney stones    passed one in 2014- 2017  . Hypertension   . Kidney stones     Past Surgical History:  Procedure Laterality Date  . CESAREAN SECTION    . CESAREAN SECTION    . HERNIA REPAIR    . LIPOSUCTION  2002  . TOTAL KNEE ARTHROPLASTY Right 11/30/2016   Procedure: RIGHT TOTAL KNEE ARTHROPLASTY;  Surgeon: Tarry Kos, MD;  [facility];  Service: Orthopedics;  Laterality: Right;    There were no vitals filed for this visit.      Subjective Assessment - 01/11/17 1150    Subjective Patient reports knee doing well today, had pain this morning but took medicine.    Pertinent History s/P right TKR 11/30/2016   How long can you walk comfortably? little pain with walking   Patient Stated Goals increase right knee flexible, walk without a walker; return to driving   Currently in Pain? No/denies   Pain Score 0-No pain                         [facility] Adult PT Treatment/Exercise - 01/11/17 0001      Knee/Hip Exercises: Stretches   Passive Hamstring Stretch Both;2 reps;10 seconds  Ankle propped   Gastroc Stretch --     Knee/Hip  Exercises: Aerobic   Recumbent Bike L0 x8 min  was able to make full revolution today but slow, some comps      Knee/Hip Exercises: Machines for Strengthening   Total Gym Leg Press Seat 5: RTLE 30# 10x     Knee/Hip Exercises: Standing   Forward Step Up Right;2 sets;10 reps;Hand Hold: 1;Step Height: 6"   Rebounder weight shifting 3 ways 1 min each  No UE     Knee/Hip Exercises: Seated   Long Arc Quad Strengthening;Right;3 sets;10 reps;Weights   Long Arc Quad Weight 2 lbs.     Knee/Hip Exercises: Supine   Heel Slides AAROM;Right;10 reps     Modalities   Modalities Vasopneumatic     Vasopneumatic   Number Minutes Vasopneumatic  15 minutes   Vasopnuematic Location  Knee  Right   Vasopneumatic Pressure Medium   Vasopneumatic Temperature  3 snowflakes                  PT Short Term Goals - 01/09/17 2637      PT SHORT TERM GOAL #1   Title Independent with initial HEP   Time 4   Period Weeks   Status Achieved     PT SHORT TERM  GOAL #2   Title right knee extension in sitting AROM -10 degrees due to increased strength   Time 4   Period Weeks   Status Achieved     PT SHORT TERM GOAL #3   Title right knee flexion PROM >/= 90 degrees to assist in returning to driving   Time 4   Period Weeks   Status Achieved     PT SHORT TERM GOAL #4   Title right knee pain decreased >/= 25% during daily activities   Time 4   Period Weeks   Status Achieved  75%           PT Long Term Goals - 01/04/17 1141      PT LONG TERM GOAL #1   Title independent with HEP   Time 8   Period Weeks   Status On-going     PT LONG TERM GOAL #2   Title walk without a walker in the community with minimal gait deficits   Time 8   Period Weeks   Status On-going     PT LONG TERM GOAL #3   Title bring right leg in and out of bed without left leg helping it   Time 8   Period Weeks   Status On-going     PT LONG TERM GOAL #4   Title return to driving due to increased right knee  flexion AROM >/= 110 degrees   Time 8   Period Weeks   Status On-going     PT LONG TERM GOAL #5   Title right knee strength >/= 4/5 so she is able to go up down steps with minimal difficulty using a railing   Time 8   Period Weeks   Status On-going     PT LONG TERM GOAL #6   Title knee extension >/= -5 degrees so she is able to walk with no assistive device and strength increased >/= 4/5   Time 8   Period Weeks   Status On-going     PT LONG TERM GOAL #7   Title right knee pain decreased >/= 75% with daily activities   Time 8   Period Weeks   Status On-going               Plan - 01/11/17 1213    Clinical Impression Statement Pt continues to progress with Right knee strength and stability. Limited by Lt knee pain. Will continue to progress balance and gait pattern along iwth strengthening and ROM.    Rehab Potential Excellent   Clinical Impairments Affecting Rehab Potential s/p right total knee replacement 11/30/2016   PT Frequency 3x / week   PT Duration 8 weeks   PT Treatment/Interventions Cryotherapy;Stair training;Gait training;Ultrasound;Moist Heat;Therapeutic activities;Therapeutic exercise;Balance training;Neuromuscular re-education;Patient/family education;Passive range of motion;Scar mobilization;Manual techniques;Energy conservation;Vasopneumatic Device   PT Next Visit Plan Progress standing balance, gait, modalities as needed   Consulted and Agree with Plan of Care Patient      Patient will benefit from skilled therapeutic intervention in order to improve the following deficits and impairments:  Abnormal gait, Decreased range of motion, Difficulty walking, Increased fascial restricitons, Decreased endurance, Decreased activity tolerance, Pain, Decreased scar mobility, Impaired flexibility, Increased edema, Decreased strength, Decreased mobility  Visit Diagnosis: Muscle weakness (generalized)  Other abnormalities of gait and mobility  Acute pain of right  knee  Stiffness of right knee, not elsewhere classified  Localized edema     Problem List Patient Active Problem List   Diagnosis Date Noted  .  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 Primary osteoarthritis of both knees 11/18/2016  . HTN (hypertension) 11/10/2016  . Cervical radiculopathy 09/19/2016  . Left arm pain 09/19/2016  . Unilateral primary osteoarthritis, right knee 09/30/2015  . Left lateral ankle pain 09/30/2015  . Plantar fasciitis of left foot 12/18/2013  . 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 Hydronephrosis, right 05/30/2013  . Nephrolithiasis 05/30/2013    Dessa Phi PTA 01/11/2017, 1:11 PM  [facility] [address] [place], [place], [address] Phone: [phone]   Fax:  [phone]  Name: Catherine Patrick MRN: [id number] Date of Birth: 1966-05-03

## 2017-01-13 ENCOUNTER — Encounter: Payer: Self-pay | Admitting: Physical Therapy

## 2017-01-13 ENCOUNTER — Ambulatory Visit: Payer: Medicaid Other | Admitting: Physical Therapy

## 2017-01-13 ENCOUNTER — Telehealth (INDEPENDENT_AMBULATORY_CARE_PROVIDER_SITE_OTHER): Payer: Self-pay | Admitting: *Deleted

## 2017-01-13 DIAGNOSIS — M6281 Muscle weakness (generalized): Secondary | ICD-10-CM | POA: Diagnosis not present

## 2017-01-13 DIAGNOSIS — R2689 Other abnormalities of gait and mobility: Secondary | ICD-10-CM

## 2017-01-13 DIAGNOSIS — R600 Localized edema: Secondary | ICD-10-CM

## 2017-01-13 DIAGNOSIS — M25561 Pain in right knee: Secondary | ICD-10-CM

## 2017-01-13 DIAGNOSIS — M25661 Stiffness of right knee, not elsewhere classified: Secondary | ICD-10-CM

## 2017-01-13 NOTE — Therapy (Signed)
[facility] [address] [place], [place], [address] Phone: [phone]   Fax:  [phone]  Physical Therapy Treatment  Patient Details  Name: Catherine Patrick MRN: [id number] Date of Birth: [birth year] Referring Provider: Dr. Gershon Patrick  Encounter Date: 01/13/2017      PT End of Session - 01/13/17 0847    Visit Number 9   Date for PT Re-Evaluation 02/21/17   Authorization Type CAFA valid until 02/14/2017   PT Start Time 0845   PT Stop Time 0938   PT Time Calculation (min) 53 min   Activity Tolerance Patient tolerated treatment well   Behavior During Therapy [facility] for tasks assessed/performed      Past Medical History:  Diagnosis Date  . DJD (degenerative joint disease)   . Headache   . History of blood transfusion    2 times after child birth  . History of kidney stones    passed one in 2014- 2017  . Hypertension   . Kidney stones     Past Surgical History:  Procedure Laterality Date  . CESAREAN SECTION    . CESAREAN SECTION    . HERNIA REPAIR    . LIPOSUCTION  2002  . TOTAL KNEE ARTHROPLASTY Right 11/30/2016   Procedure: RIGHT TOTAL KNEE ARTHROPLASTY;  Surgeon: Catherine Kos, MD;  [facility];  Service: Orthopedics;  Laterality: Right;    There were no vitals filed for this visit.      Subjective Assessment - 01/13/17 0847    Subjective Pt reports knee is doing ok   Pertinent History s/P right TKR 11/30/2016   How long can you walk comfortably? little pain with walking   Patient Stated Goals increase right knee flexible, walk without a walker; return to driving   Currently in Pain? No/denies   Pain Score 0-No pain            [facility] PT Assessment - 01/13/17 0001      PROM   Right Knee Extension 0   Right Knee Flexion 100                     [facility] Adult PT Treatment/Exercise - 01/13/17 0001      Knee/Hip Exercises: Aerobic   Nustep L2 x 5 minutes  Therapist present to  discuss treatment     Knee/Hip Exercises: Machines for Strengthening   Total Gym Leg Press Seat 5: RTLE 35# 10x     Knee/Hip Exercises: Standing   Heel Raises Both;2 sets;10 reps   Knee Flexion Strengthening;Right;10 reps;1 set   Hip Abduction Stengthening;Both;2 sets;10 reps   Hip Extension Stengthening;Both;2 sets;10 reps   Forward Step Up Right;2 sets;10 reps;Hand Hold: 1;Step Height: 6"   Other Standing Knee Exercises Ladder walking forward, side  verbal cues for heel strike     Knee/Hip Exercises: Seated   Long Arc Quad Strengthening;Right;3 sets;10 reps;Weights   Long Arc Quad Weight 3 lbs.   Abduction/Adduction  Strengthening;Both;20 reps     Knee/Hip Exercises: Supine   Heel Slides AAROM;Right;10 reps     Modalities   Modalities Vasopneumatic     Vasopneumatic   Number Minutes Vasopneumatic  15 minutes   Vasopnuematic Location  Knee  Right   Vasopneumatic Pressure Medium   Vasopneumatic Temperature  3 snowflakes                  PT Short Term Goals - 01/09/17 6962      PT  SHORT TERM GOAL #1   Title Independent with initial HEP   Time 4   Period Weeks   Status Achieved     PT SHORT TERM GOAL #2   Title right knee extension in sitting AROM -10 degrees due to increased strength   Time 4   Period Weeks   Status Achieved     PT SHORT TERM GOAL #3   Title right knee flexion PROM >/= 90 degrees to assist in returning to driving   Time 4   Period Weeks   Status Achieved     PT SHORT TERM GOAL #4   Title right knee pain decreased >/= 25% during daily activities   Time 4   Period Weeks   Status Achieved  75%           PT Long Term Goals - 01/13/17 [id number]      PT LONG TERM GOAL #2   Title walk without a walker in the community with minimal gait deficits   Baseline Using cane   Time 8   Period Weeks   Status Achieved               Plan - 01/13/17 [id number]    Clinical Impression Statement Pt doing well with all standing exercises and  progressing with Rt knee strength and stability. Patient is no longer using walker for community mobility, using straight cane. Continues to be limited by Lt knee pain. Continues to have some increased swelling and tightness in Rt knee. Pt will continiue to benefit from skilled therapy for LE strengthening for dynamic balance.    Rehab Potential Excellent   Clinical Impairments Affecting Rehab Potential s/p right total knee replacement 11/30/2016   PT Frequency 3x / week   PT Duration 8 weeks   PT Treatment/Interventions Cryotherapy;Stair training;Gait training;Ultrasound;Moist Heat;Therapeutic activities;Therapeutic exercise;Balance training;Neuromuscular re-education;Patient/family education;Passive range of motion;Scar mobilization;Manual techniques;Energy conservation;Vasopneumatic Device   PT Next Visit Plan Progress standing balance, gait, modalities as needed   Consulted and Agree with Plan of Care Patient      Patient will benefit from skilled therapeutic intervention in order to improve the following deficits and impairments:  Abnormal gait, Decreased range of motion, Difficulty walking, Increased fascial restricitons, Decreased endurance, Decreased activity tolerance, Pain, Decreased scar mobility, Impaired flexibility, Increased edema, Decreased strength, Decreased mobility  Visit Diagnosis: Muscle weakness (generalized)  Other abnormalities of gait and mobility  Acute pain of right knee  Stiffness of right knee, not elsewhere classified  Localized edema     Problem List Patient Active Problem List   Diagnosis Date Noted  . 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 Primary osteoarthritis of both knees 11/18/2016  . HTN (hypertension) 11/10/2016  . Cervical radiculopathy 09/19/2016  . Left arm pain 09/19/2016  . Unilateral primary osteoarthritis, right knee 09/30/2015  . Left lateral ankle pain 09/30/2015  . Plantar fasciitis of left foot 12/18/2013  . Metatarsal  deformity 12/18/2013  . 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 Hydronephrosis, right 05/30/2013  . Nephrolithiasis 05/30/2013    Catherine Patrick PTA 01/13/2017, 9:32 AM  [facility] [address] [place], [place], [address] Phone: [phone]   Fax:  [phone]  Name: Catherine Patrick MRN: [id number] Date of Birth: 05/30/1966

## 2017-01-13 NOTE — Telephone Encounter (Signed)
Pt came in for letter. Pt stated this is not what she needed. Pt requesting call back to explain what she is needing. She left a letter last time she was in for Dr. Roda ShuttersXu explaining what was needed.

## 2017-01-16 ENCOUNTER — Ambulatory Visit: Payer: Medicaid Other | Admitting: Physical Therapy

## 2017-01-16 ENCOUNTER — Encounter: Payer: Self-pay | Admitting: Physical Therapy

## 2017-01-16 DIAGNOSIS — R600 Localized edema: Secondary | ICD-10-CM

## 2017-01-16 DIAGNOSIS — M25561 Pain in right knee: Secondary | ICD-10-CM

## 2017-01-16 DIAGNOSIS — R2689 Other abnormalities of gait and mobility: Secondary | ICD-10-CM

## 2017-01-16 DIAGNOSIS — M6281 Muscle weakness (generalized): Secondary | ICD-10-CM

## 2017-01-16 DIAGNOSIS — M25661 Stiffness of right knee, not elsewhere classified: Secondary | ICD-10-CM

## 2017-01-16 NOTE — Therapy (Signed)
[facility] [address] [place], [place], [address] Phone: [phone]   Fax:  [phone]  Physical Therapy Treatment  Patient Details  Name: Catherine Patrick MRN: [id number] Date of Birth: 04-16-1966 Referring Provider: Dr. Frankey Shown  Encounter Date: 01/16/2017      PT End of Session - 01/16/17 1056    Visit Number 10   Date for PT Re-Evaluation 02/21/17   Authorization Type CAFA valid until 02/14/2017   PT Start Time 1015   PT Stop Time 1110   PT Time Calculation (min) 55 min   Activity Tolerance Patient tolerated treatment well   Behavior During Therapy [facility] for tasks assessed/performed      Past Medical History:  Diagnosis Date  . DJD (degenerative joint disease)   . Headache   . History of blood transfusion    2 times after child birth  . History of kidney stones    passed one in 2014- 2017  . Hypertension   . Kidney stones     Past Surgical History:  Procedure Laterality Date  . CESAREAN SECTION    . CESAREAN SECTION    . HERNIA REPAIR    . LIPOSUCTION  2002  . TOTAL KNEE ARTHROPLASTY Right 11/30/2016   Procedure: RIGHT TOTAL KNEE ARTHROPLASTY;  Surgeon: Leandrew Koyanagi, MD;  Location: [place];  Service: Orthopedics;  Laterality: Right;    There were no vitals filed for this visit.      Subjective Assessment - 01/16/17 1025    Subjective I am driving now.  I see MD 02/07/2017. I can go up steps with greater ease with step to step pattern.  I can get into the bed easier and not help my right leg in. I can bend my knee easier.    Pertinent History s/P right TKR 11/30/2016   How long can you walk comfortably? little pain with walking   Patient Stated Goals increase right knee flexible, walk without a walker; return to driving   Currently in Pain? No/denies            [facility] PT Assessment - 01/16/17 0001      PROM   Right Knee Extension 0   Right Knee Flexion 100                      [facility] Adult PT Treatment/Exercise - 01/16/17 0001      Knee/Hip Exercises: Aerobic   Recumbent Bike L0 x5 min     Knee/Hip Exercises: Machines for Strengthening   Total Gym Leg Press Seat 5: RTLE 35# 10x3; bil. 50#  no pain     Knee/Hip Exercises: Standing   Heel Raises Both;2 sets;10 reps  on step   Knee Flexion Strengthening;Right;10 reps;1 set;3 sets  2#     Modalities   Modalities Vasopneumatic     Vasopneumatic   Number Minutes Vasopneumatic  15 minutes   Vasopnuematic Location  Knee  Right   Vasopneumatic Pressure Medium   Vasopneumatic Temperature  3 snowflakes     Manual Therapy   Manual Therapy Soft tissue mobilization   Manual therapy comments PROM to right knee for flexion   Soft tissue mobilization right quads, scar and gastroc                  PT Short Term Goals - 01/09/17 9735      PT SHORT TERM GOAL #1   Title Independent with initial HEP  Time 4   Period Weeks   Status Achieved     PT SHORT TERM GOAL #2   Title right knee extension in sitting AROM -10 degrees due to increased strength   Time 4   Period Weeks   Status Achieved     PT SHORT TERM GOAL #3   Title right knee flexion PROM >/= 90 degrees to assist in returning to driving   Time 4   Period Weeks   Status Achieved     PT SHORT TERM GOAL #4   Title right knee pain decreased >/= 25% during daily activities   Time 4   Period Weeks   Status Achieved  75%           PT Long Term Goals - 01/16/17 1020      PT LONG TERM GOAL #1   Title independent with HEP   Time 8   Period Weeks   Status On-going  still learning     PT LONG TERM GOAL #2   Title walk without a walker in the community with minimal gait deficits   Baseline Using cane   Time 8   Period Weeks   Status Achieved     PT LONG TERM GOAL #3   Title bring right leg in and out of bed without left leg helping it   Time 8   Period Weeks   Status Achieved     PT LONG TERM GOAL #4   Title return to  driving due to increased right knee flexion AROM >/= 110 degrees   Time 8   Period Weeks   Status On-going  driving     PT LONG TERM GOAL #5   Title right knee strength >/= 4/5 so she is able to go up down steps with minimal difficulty using a railing   Time 8   Period Weeks   Status On-going     PT LONG TERM GOAL #6   Title knee extension >/= -5 degrees so she is able to walk with no assistive device and strength increased >/= 4/5   Time 8   Period Weeks   Status On-going     PT LONG TERM GOAL #7   Title right knee pain decreased >/= 75% with daily activities   Time 8   Period Weeks   Status On-going  has to take ibuprofen               Plan - 01/16/17 1056    Clinical Impression Statement Patient has to take Ibuprofen for pain in right knee.  Patient walks with decreaed right knee extension. Patient was able to increase weight with her exercises due to increased strength. Patient has met her sTG's.  Patient able to go up stairs with greater ease and does not have to use her hands to assist right leg into bed.  Patient will benefit form skilled therapy to increased strength and ROM of right leg.    Rehab Potential Excellent   Clinical Impairments Affecting Rehab Potential s/p right total knee replacement 11/30/2016   PT Frequency 3x / week   PT Duration 8 weeks   PT Treatment/Interventions Cryotherapy;Stair training;Gait training;Ultrasound;Moist Heat;Therapeutic activities;Therapeutic exercise;Balance training;Neuromuscular re-education;Patient/family education;Passive range of motion;Scar mobilization;Manual techniques;Energy conservation;Vasopneumatic Device   PT Next Visit Plan Progress standing balance, gait, modalities as needed; work on AROM for right knee strength   PT Home Exercise Plan progress as needed   Consulted and Agree with Plan of Care Patient  Patient will benefit from skilled therapeutic intervention in order to improve the following deficits  and impairments:  Abnormal gait, Decreased range of motion, Difficulty walking, Increased fascial restricitons, Decreased endurance, Decreased activity tolerance, Pain, Decreased scar mobility, Impaired flexibility, Increased edema, Decreased strength, Decreased mobility  Visit Diagnosis: Muscle weakness (generalized)  Other abnormalities of gait and mobility  Acute pain of right knee  Stiffness of right knee, not elsewhere classified  Localized edema     Problem List Patient Active Problem List   Diagnosis Date Noted  . 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 Primary osteoarthritis of both knees 11/18/2016  . HTN (hypertension) 11/10/2016  . Cervical radiculopathy 09/19/2016  . Left arm pain 09/19/2016  . Unilateral primary osteoarthritis, right knee 09/30/2015  . Left lateral ankle pain 09/30/2015  . Plantar fasciitis of left foot 12/18/2013  . 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 Hydronephrosis, right 05/30/2013  . Nephrolithiasis 05/30/2013    Earlie Counts, PT 01/16/17 11:00 AM   [facility] [address], [place], [place], [address] Phone: [phone]   Fax:  [phone]  Name: Catherine Patrick MRN: [id number] Date of Birth: Aug 20, 1966

## 2017-01-18 ENCOUNTER — Ambulatory Visit: Payer: Medicaid Other | Admitting: Physical Therapy

## 2017-01-18 ENCOUNTER — Encounter: Payer: Self-pay | Admitting: Physical Therapy

## 2017-01-18 DIAGNOSIS — R600 Localized edema: Secondary | ICD-10-CM

## 2017-01-18 DIAGNOSIS — R2689 Other abnormalities of gait and mobility: Secondary | ICD-10-CM

## 2017-01-18 DIAGNOSIS — M6281 Muscle weakness (generalized): Secondary | ICD-10-CM

## 2017-01-18 DIAGNOSIS — M25661 Stiffness of right knee, not elsewhere classified: Secondary | ICD-10-CM

## 2017-01-18 DIAGNOSIS — M25561 Pain in right knee: Secondary | ICD-10-CM

## 2017-01-18 NOTE — Therapy (Signed)
[facility] [address] [place], [place], [address] Phone: [phone]   Fax:  [phone]  Physical Therapy Treatment  Patient Details  Name: Catherine Patrick MRN: [id number] Date of Birth: 11/26/1966 Referring Provider: Dr. Gershon Mussel  Encounter Date: 01/18/2017      PT End of Session - 01/18/17 1227    Visit Number 11   Date for PT Re-Evaluation 02/21/17   Authorization Type CAFA valid until 02/14/2017   PT Start Time 1152   PT Stop Time 1240   PT Time Calculation (min) 48 min   Activity Tolerance Patient tolerated treatment well   Behavior During Therapy [facility] for tasks assessed/performed      Past Medical History:  Diagnosis Date  . DJD (degenerative joint disease)   . Headache   . History of blood transfusion    2 times after child birth  . History of kidney stones    passed one in 2014- 2017  . Hypertension   . Kidney stones     Past Surgical History:  Procedure Laterality Date  . CESAREAN SECTION    . CESAREAN SECTION    . HERNIA REPAIR    . LIPOSUCTION  2002  . TOTAL KNEE ARTHROPLASTY Right 11/30/2016   Procedure: RIGHT TOTAL KNEE ARTHROPLASTY;  Surgeon: Tarry Kos, MD;  Location: [facility];  Service: Orthopedics;  Laterality: Right;    There were no vitals filed for this visit.      Subjective Assessment - 01/18/17 1154    Subjective My knee was sore and swollen from last time.     Pertinent History s/P right TKR 11/30/2016   How long can you walk comfortably? little pain with walking   Patient Stated Goals increase right knee flexible, walk without a walker; return to driving   Currently in Pain? Yes   Pain Score 4    Pain Location Knee   Pain Orientation Right   Pain Descriptors / Indicators Sore   Pain Type Surgical pain   Pain Onset 1 to 4 weeks ago   Pain Frequency Intermittent   Aggravating Factors  bending, walking   Pain Relieving Factors rest   Multiple Pain Sites No             [facility] PT Assessment - 01/18/17 0001      PROM   Right Knee Flexion 102                     [facility] Adult PT Treatment/Exercise - 01/18/17 0001      Knee/Hip Exercises: Aerobic   Recumbent Bike L0 x6 min     Knee/Hip Exercises: Machines for Strengthening   Total Gym Leg Press Seat 5: RTLE 35# 10x3; bil. 50#  no pain     Knee/Hip Exercises: Standing   Heel Raises Both;2 sets;10 reps  on step   Forward Step Up Right;2 sets;10 reps;Hand Hold: 1;Step Height: 6"   Rebounder weight shifting 3 ways 1 min each  No UE     Knee/Hip Exercises: Supine   Short Arc Quad Sets Strengthening;Right;1 set;10 reps  hold 10 sec; able to fully straigthen right knee   Other Supine Knee/Hip Exercises feet on red physioball working on knee flexion     Modalities   Modalities Vasopneumatic     Vasopneumatic   Number Minutes Vasopneumatic  15 minutes   Vasopnuematic Location  Knee  Right   Vasopneumatic Pressure Medium   Vasopneumatic Temperature  3  snowflakes                PT Education - 01/18/17 1227    Education provided No          PT Short Term Goals - 01/09/17 0907      PT SHORT TERM GOAL #1   Title Independent with initial HEP   Time 4   Period Weeks   Status Achieved     PT SHORT TERM GOAL #2   Title right knee extension in sitting AROM -10 degrees due to increased strength   Time 4   Period Weeks   Status Achieved     PT SHORT TERM GOAL #3   Title right knee flexion PROM >/= 90 degrees to assist in returning to driving   Time 4   Period Weeks   Status Achieved     PT SHORT TERM GOAL #4   Title right knee pain decreased >/= 25% during daily activities   Time 4   Period Weeks   Status Achieved  75%           PT Long Term Goals - 01/16/17 1020      PT LONG TERM GOAL #1   Title independent with HEP   Time 8   Period Weeks   Status On-going  still learning     PT LONG TERM GOAL #2   Title walk without a walker in the  community with minimal gait deficits   Baseline Using cane   Time 8   Period Weeks   Status Achieved     PT LONG TERM GOAL #3   Title bring right leg in and out of bed without left leg helping it   Time 8   Period Weeks   Status Achieved     PT LONG TERM GOAL #4   Title return to driving due to increased right knee flexion AROM >/= 110 degrees   Time 8   Period Weeks   Status On-going  driving     PT LONG TERM GOAL #5   Title right knee strength >/= 4/5 so she is able to go up down steps with minimal difficulty using a railing   Time 8   Period Weeks   Status On-going     PT LONG TERM GOAL #6   Title knee extension >/= -5 degrees so she is able to walk with no assistive device and strength increased >/= 4/5   Time 8   Period Weeks   Status On-going     PT LONG TERM GOAL #7   Title right knee pain decreased >/= 75% with daily activities   Time 8   Period Weeks   Status On-going  has to take ibuprofen               Plan - 01/18/17 1228    Clinical Impression Statement Patient able to fully extend her right knee on step ups for first time.  Patient has greater ease with knee flexion when rolling the physioball back and forth.  Patient had difficulty with going all the way around the bike today. Patient will benefit from skilled therapy to increased strength and ROM of right knee.    Rehab Potential Excellent   Clinical Impairments Affecting Rehab Potential s/p right total knee replacement 11/30/2016   PT Frequency 3x / week   PT Duration 8 weeks   PT Treatment/Interventions Cryotherapy;Stair training;Gait training;Ultrasound;Moist Heat;Therapeutic activities;Therapeutic exercise;Balance training;Neuromuscular re-education;Patient/family education;Passive range of motion;Scar mobilization;Manual  techniques;Energy conservation;Vasopneumatic Device   PT Next Visit Plan Progress standing balance, gait, modalities as needed; work on AROM for right knee strength   PT  Home Exercise Plan progress as needed   Consulted and Agree with Plan of Care Patient      Patient will benefit from skilled therapeutic intervention in order to improve the following deficits and impairments:  Abnormal gait, Decreased range of motion, Difficulty walking, Increased fascial restricitons, Decreased endurance, Decreased activity tolerance, Pain, Decreased scar mobility, Impaired flexibility, Increased edema, Decreased strength, Decreased mobility  Visit Diagnosis: Muscle weakness (generalized)  Other abnormalities of gait and mobility  Acute pain of right knee  Stiffness of right knee, not elsewhere classified  Localized edema     Problem List Patient Active Problem List   Diagnosis Date Noted  . 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 Primary osteoarthritis of both knees 11/18/2016  . HTN (hypertension) 11/10/2016  . Cervical radiculopathy 09/19/2016  . Left arm pain 09/19/2016  . Unilateral primary osteoarthritis, right knee 09/30/2015  . Left lateral ankle pain 09/30/2015  . Plantar fasciitis of left foot 12/18/2013  . 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 Hydronephrosis, right 05/30/2013  . Nephrolithiasis 05/30/2013    Eulis Foster, PT 01/18/17 12:31 PM   [facility] [address], [address] [place], [place], [address] Phone: [phone]   Fax:  [phone]  Name: Catherine Patrick MRN: [id number] Date of Birth: Dec 27, 1965

## 2017-01-18 NOTE — Telephone Encounter (Signed)
Letter is ready for pick up at the front desk. Pt aware

## 2017-01-20 ENCOUNTER — Ambulatory Visit: Payer: Medicaid Other | Admitting: Physical Therapy

## 2017-01-20 ENCOUNTER — Encounter: Payer: Self-pay | Admitting: Physical Therapy

## 2017-01-20 DIAGNOSIS — M25561 Pain in right knee: Secondary | ICD-10-CM

## 2017-01-20 DIAGNOSIS — M6281 Muscle weakness (generalized): Secondary | ICD-10-CM

## 2017-01-20 DIAGNOSIS — R600 Localized edema: Secondary | ICD-10-CM

## 2017-01-20 DIAGNOSIS — R2689 Other abnormalities of gait and mobility: Secondary | ICD-10-CM

## 2017-01-20 DIAGNOSIS — M25661 Stiffness of right knee, not elsewhere classified: Secondary | ICD-10-CM

## 2017-01-20 NOTE — Therapy (Signed)
[facility] [address] [place], [place], [address] Phone: [phone]   Fax:  [phone]  Physical Therapy Treatment  Patient Details  Name: Catherine Patrick MRN: [id number] Date of Birth: [birth year] Referring Provider: Dr. Gershon Mussel  Encounter Date: 01/20/2017      PT End of Session - 01/20/17 0904    Visit Number 12   Date for PT Re-Evaluation 02/21/17   Authorization Type CAFA valid until 02/14/2017   PT Start Time 0858   PT Stop Time 0944   PT Time Calculation (min) 46 min   Activity Tolerance Patient tolerated treatment well   Behavior During Therapy [facility] for tasks assessed/performed      Past Medical History:  Diagnosis Date  . DJD (degenerative joint disease)   . Headache   . History of blood transfusion    2 times after child birth  . History of kidney stones    passed one in 2014- 2017  . Hypertension   . Kidney stones     Past Surgical History:  Procedure Laterality Date  . CESAREAN SECTION    . CESAREAN SECTION    . HERNIA REPAIR    . LIPOSUCTION  2002  . TOTAL KNEE ARTHROPLASTY Right 11/30/2016   Procedure: RIGHT TOTAL KNEE ARTHROPLASTY;  Surgeon: Tarry Kos, MD;  Location: [facility];  Service: Orthopedics;  Laterality: Right;    There were no vitals filed for this visit.      Subjective Assessment - 01/20/17 0904    Subjective Pt reports knee doing well today.    Pertinent History s/P right TKR 11/30/2016   How long can you walk comfortably? little pain with walking   Patient Stated Goals increase right knee flexible, walk without a walker; return to driving   Currently in Pain? Yes   Pain Score 4    Pain Location Knee   Pain Orientation Right   Pain Descriptors / Indicators Sore   Pain Type Surgical pain   Pain Onset 1 to 4 weeks ago   Pain Frequency Intermittent   Aggravating Factors  bending, walking   Pain Relieving Factors rest                          [facility] Adult PT Treatment/Exercise - 01/20/17 0001      Knee/Hip Exercises: Stretches   Active Hamstring Stretch Right;3 reps;10 seconds  green strap     Knee/Hip Exercises: Aerobic   Recumbent Bike L0 x6 min  for ROM   Nustep L2 x 5 minutes  Therapist present to discuss treatment     Knee/Hip Exercises: Machines for Strengthening   Total Gym Leg Press Seat 5: RTLE 35# 10x3  no pain     Knee/Hip Exercises: Standing   Other Standing Knee Exercises --     Knee/Hip Exercises: Seated   Long Arc Quad Strengthening;Right;3 sets;10 reps;Weights   Long Arc Quad Weight 4 lbs.     Knee/Hip Exercises: Supine   Short Arc Quad Sets Strengthening;Right;1 set;10 reps  #5 5 second holds   Heel Slides AAROM;Right;10 reps     Knee/Hip Exercises: Sidelying   Hip ABduction Strengthening;Right;2 sets;10 reps     Modalities   Modalities Vasopneumatic     Vasopneumatic   Number Minutes Vasopneumatic  15 minutes   Vasopnuematic Location  Knee  Right   Vasopneumatic Pressure Medium   Vasopneumatic Temperature  3 snowflakes  PT Short Term Goals - 01/09/17 0907      PT SHORT TERM GOAL #1   Title Independent with initial HEP   Time 4   Period Weeks   Status Achieved     PT SHORT TERM GOAL #2   Title right knee extension in sitting AROM -10 degrees due to increased strength   Time 4   Period Weeks   Status Achieved     PT SHORT TERM GOAL #3   Title right knee flexion PROM >/= 90 degrees to assist in returning to driving   Time 4   Period Weeks   Status Achieved     PT SHORT TERM GOAL #4   Title right knee pain decreased >/= 25% during daily activities   Time 4   Period Weeks   Status Achieved  75%           PT Long Term Goals - 01/20/17 16100937      PT LONG TERM GOAL #1   Title independent with HEP   Time 8   Status On-going               Plan - 01/20/17 0957    Clinical Impression Statement Pt continues to progress with Rt knee  strength and ROM. Pt is limited by Lt knee pain. All exercsies today focused on Rt knee strength. Pt will continue to benefit from skilled therapy for knee strength and stability.    Rehab Potential Excellent   Clinical Impairments Affecting Rehab Potential s/p right total knee replacement 11/30/2016   PT Frequency 3x / week   PT Duration 8 weeks   PT Treatment/Interventions Cryotherapy;Stair training;Gait training;Ultrasound;Moist Heat;Therapeutic activities;Therapeutic exercise;Balance training;Neuromuscular re-education;Patient/family education;Passive range of motion;Scar mobilization;Manual techniques;Energy conservation;Vasopneumatic Device   PT Next Visit Plan Progress standing balance, gait, modalities as needed; work on AROM for right knee strength   Consulted and Agree with Plan of Care Patient      Patient will benefit from skilled therapeutic intervention in order to improve the following deficits and impairments:  Abnormal gait, Decreased range of motion, Difficulty walking, Increased fascial restricitons, Decreased endurance, Decreased activity tolerance, Pain, Decreased scar mobility, Impaired flexibility, Increased edema, Decreased strength, Decreased mobility  Visit Diagnosis: Muscle weakness (generalized)  Other abnormalities of gait and mobility  Acute pain of right knee  Stiffness of right knee, not elsewhere classified  Localized edema     Problem List Patient Active Problem List   Diagnosis Date Noted  . 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 Primary osteoarthritis of both knees 11/18/2016  . HTN (hypertension) 11/10/2016  . Cervical radiculopathy 09/19/2016  . Left arm pain 09/19/2016  . Unilateral primary osteoarthritis, right knee 09/30/2015  . Left lateral ankle pain 09/30/2015  . Plantar fasciitis of left foot 12/18/2013  . 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 Hydronephrosis, right 05/30/2013  .  Nephrolithiasis 05/30/2013    Dessa PhiKatherine Jacinta Penalver PTA 01/20/2017, 10:03 AM  [facility] [address], [address] [place], [place], [address] Phone: [phone]   Fax:  [phone]  Name: Catherine Patrick MRN: [id number] Date of Birth: 11/28/1966

## 2017-01-23 ENCOUNTER — Encounter: Payer: Self-pay | Admitting: Physical Therapy

## 2017-01-23 ENCOUNTER — Ambulatory Visit: Payer: Medicaid Other | Admitting: Physical Therapy

## 2017-01-23 DIAGNOSIS — R2689 Other abnormalities of gait and mobility: Secondary | ICD-10-CM

## 2017-01-23 DIAGNOSIS — M25561 Pain in right knee: Secondary | ICD-10-CM

## 2017-01-23 DIAGNOSIS — M6281 Muscle weakness (generalized): Secondary | ICD-10-CM

## 2017-01-23 DIAGNOSIS — M25661 Stiffness of right knee, not elsewhere classified: Secondary | ICD-10-CM

## 2017-01-23 DIAGNOSIS — R600 Localized edema: Secondary | ICD-10-CM

## 2017-01-23 NOTE — Therapy (Signed)
[facility] [address], [place], [place], [address] Phone: [phone]   Fax:  [phone]  Physical Therapy Treatment  Patient Details  Name: Catherine Patrick MRN: [id number] Date of Birth: 10/07/1966 Referring Provider: Dr. Frankey Shown  Encounter Date: 01/23/2017      PT End of Session - 01/23/17 0904    Visit Number 13   Date for PT Re-Evaluation 02/21/17   Authorization Type CAFA valid until 02/14/2017   PT Start Time 0854   PT Stop Time 0940   PT Time Calculation (min) 46 min   Activity Tolerance Patient limited by pain   Behavior During Therapy [facility] for tasks assessed/performed      Past Medical History:  Diagnosis Date  . DJD (degenerative joint disease)   . Headache   . History of blood transfusion    2 times after child birth  . History of kidney stones    passed one in 2014- 2017  . Hypertension   . Kidney stones     Past Surgical History:  Procedure Laterality Date  . CESAREAN SECTION    . CESAREAN SECTION    . HERNIA REPAIR    . LIPOSUCTION  2002  . TOTAL KNEE ARTHROPLASTY Right 11/30/2016   Procedure: RIGHT TOTAL KNEE ARTHROPLASTY;  Surgeon: Leandrew Koyanagi, MD;  Location: [place];  Service: Orthopedics;  Laterality: Right;    There were no vitals filed for this visit.      Subjective Assessment - 01/23/17 0856    Subjective Pt reports having more pain in Rt knee than before. Continues to also have pain in Lt knee.    Pertinent History s/P right TKR 11/30/2016   How long can you walk comfortably? little pain with walking   Patient Stated Goals increase right knee flexible, walk without a walker; return to driving   Currently in Pain? Yes   Pain Score 6    Pain Location Knee   Pain Orientation Right   Pain Descriptors / Indicators Sore   Pain Type Surgical pain   Pain Onset 1 to 4 weeks ago   Pain Frequency Intermittent   Aggravating Factors  bending, walking   Pain Relieving Factors rest    Multiple Pain Sites No                         [facility] Adult PT Treatment/Exercise - 01/23/17 0001      Knee/Hip Exercises: Stretches   Active Hamstring Stretch Right;3 reps;10 seconds  green strap     Knee/Hip Exercises: Aerobic   Recumbent Bike L0 x6 min  for ROM, therapist present to discuss treatment     Knee/Hip Exercises: Machines for Strengthening   Total Gym Leg Press Seat 5: RTLE 35# 10x3  no pain     Knee/Hip Exercises: Standing   Forward Step Up Right;2 sets;10 reps;Hand Hold: 1;Step Height: 6"  Practicing navigating stairs up and down     Knee/Hip Exercises: Supine   Heel Slides AAROM;Right;10 reps   Other Supine Knee/Hip Exercises 3 direction hip raises  2x10 each     Modalities   Modalities Vasopneumatic     Vasopneumatic   Number Minutes Vasopneumatic  15 minutes   Vasopnuematic Location  Knee   Vasopneumatic Pressure Medium   Vasopneumatic Temperature  3 snowflakes                  PT Short Term Goals - 01/09/17 0174  PT SHORT TERM GOAL #1   Title Independent with initial HEP   Time 4   Period Weeks   Status Achieved     PT SHORT TERM GOAL #2   Title right knee extension in sitting AROM -10 degrees due to increased strength   Time 4   Period Weeks   Status Achieved     PT SHORT TERM GOAL #3   Title right knee flexion PROM >/= 90 degrees to assist in returning to driving   Time 4   Period Weeks   Status Achieved     PT SHORT TERM GOAL #4   Title right knee pain decreased >/= 25% during daily activities   Time 4   Period Weeks   Status Achieved  75%           PT Long Term Goals - 01/23/17 4696      PT LONG TERM GOAL #1   Title independent with HEP   Time 8   Period Weeks   Status On-going     PT LONG TERM GOAL #4   Title return to driving due to increased right knee flexion AROM >/= 110 degrees   Baseline Pt is driving some   Time 8   Period Weeks   Status On-going     PT LONG TERM GOAL  #5   Title right knee strength >/= 4/5 so she is able to go up down steps with minimal difficulty using a railing   Time 8   Period Weeks   Status On-going               Plan - 01/23/17 2952    Clinical Impression Statement Pt having increased pain in Rt knee and continues to be limited with all standing exercises due to Lt knee pain. Pt has met all short term goals and continues to progress towards long term goals. Pt will continue to benefit from skilled therapy for knee strength and stability.    Rehab Potential Excellent   Clinical Impairments Affecting Rehab Potential s/p right total knee replacement 11/30/2016   PT Frequency 3x / week   PT Duration 8 weeks   PT Treatment/Interventions Cryotherapy;Stair training;Gait training;Ultrasound;Moist Heat;Therapeutic activities;Therapeutic exercise;Balance training;Neuromuscular re-education;Patient/family education;Passive range of motion;Scar mobilization;Manual techniques;Energy conservation;Vasopneumatic Device   PT Next Visit Plan Progress standing balance, gait, modalities as needed; work on AROM for right knee strength   Consulted and Agree with Plan of Care Patient      Patient will benefit from skilled therapeutic intervention in order to improve the following deficits and impairments:  Abnormal gait, Decreased range of motion, Difficulty walking, Increased fascial restricitons, Decreased endurance, Decreased activity tolerance, Pain, Decreased scar mobility, Impaired flexibility, Increased edema, Decreased strength, Decreased mobility  Visit Diagnosis: Muscle weakness (generalized)  Other abnormalities of gait and mobility  Acute pain of right knee  Stiffness of right knee, not elsewhere classified  Localized edema     Problem List Patient Active Problem List   Diagnosis Date Noted  . 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 Primary osteoarthritis of both knees 11/18/2016  . HTN (hypertension) 11/10/2016  .  Cervical radiculopathy 09/19/2016  . Left arm pain 09/19/2016  . Unilateral primary osteoarthritis, right knee 09/30/2015  . Left lateral ankle pain 09/30/2015  . Plantar fasciitis of left foot 12/18/2013  . 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 Hydronephrosis, right 05/30/2013  . Nephrolithiasis 05/30/2013    Mikle Bosworth PTA 01/23/2017,  9:[facility] [address] [place], [place], [address] Phone: [phone]   Fax:  [phone]  Name: Catherine Patrick MRN: [id number] Date of Birth: 12-05-1966

## 2017-01-25 ENCOUNTER — Ambulatory Visit: Payer: Medicaid Other | Admitting: Physical Therapy

## 2017-01-25 ENCOUNTER — Encounter: Payer: Self-pay | Admitting: Physical Therapy

## 2017-01-25 DIAGNOSIS — R2689 Other abnormalities of gait and mobility: Secondary | ICD-10-CM

## 2017-01-25 DIAGNOSIS — M25561 Pain in right knee: Secondary | ICD-10-CM

## 2017-01-25 DIAGNOSIS — M6281 Muscle weakness (generalized): Secondary | ICD-10-CM | POA: Diagnosis not present

## 2017-01-25 NOTE — Therapy (Signed)
[facility] [address], [place], [place], [address] Phone: [phone]   Fax:  [phone]  Physical Therapy Treatment  Patient Details  Name: Catherine Patrick MRN: [id number] Date of Birth: [birth year] Referring Provider: Dr. Frankey Shown  Encounter Date: 01/25/2017      PT End of Session - 01/25/17 1153    Visit Number 14   Date for PT Re-Evaluation 02/21/17   Authorization Type CAFA valid until 02/14/2017   PT Start Time 1149   PT Stop Time 1231   PT Time Calculation (min) 42 min   Activity Tolerance Patient limited by pain   Behavior During Therapy [facility] for tasks assessed/performed      Past Medical History:  Diagnosis Date  . DJD (degenerative joint disease)   . Headache   . History of blood transfusion    2 times after child birth  . History of kidney stones    passed one in 2014- 2017  . Hypertension   . Kidney stones     Past Surgical History:  Procedure Laterality Date  . CESAREAN SECTION    . CESAREAN SECTION    . HERNIA REPAIR    . LIPOSUCTION  2002  . TOTAL KNEE ARTHROPLASTY Right 11/30/2016   Procedure: RIGHT TOTAL KNEE ARTHROPLASTY;  Surgeon: Leandrew Koyanagi, MD;  Location: [facility];  Service: Orthopedics;  Laterality: Right;    There were no vitals filed for this visit.      Subjective Assessment - 01/25/17 1152    Subjective Pt reports no pain in knee, doing well.   Pertinent History s/P right TKR 11/30/2016   How long can you walk comfortably? little pain with walking   Patient Stated Goals increase right knee flexible, walk without a walker; return to driving   Currently in Pain? No/denies   Pain Score 0-No pain            [facility] PT Assessment - 01/25/17 0001      PROM   Right Knee Extension 0   Right Knee Flexion 110                     [facility] Adult PT Treatment/Exercise - 01/25/17 0001      Knee/Hip Exercises: Aerobic   Recumbent Bike L0 x8 min  for ROM,  therapist present to discuss treatment   Nustep L3 x 8 minutes  Legs only     Knee/Hip Exercises: Machines for Strengthening   Total Gym Leg Press Seat 5: RTLE 35# 10x3  no pain     Knee/Hip Exercises: Standing   Forward Step Up Right;2 sets;10 reps;Hand Hold: 1;Step Height: 6"  Practicing navigating stairs up and down   SLS 3 x30 seconds  On blue mat     Knee/Hip Exercises: Seated   Long Arc Quad Strengthening;Right;3 sets;10 reps;Weights   Long Arc Quad Weight 5 lbs.     Knee/Hip Exercises: Supine   Heel Slides AAROM;Right;10 reps   Other Supine Knee/Hip Exercises 3 direction hip raises  2x10 Lt knee kicking                  PT Short Term Goals - 01/09/17 0907      PT SHORT TERM GOAL #1   Title Independent with initial HEP   Time 4   Period Weeks   Status Achieved     PT SHORT TERM GOAL #2   Title right knee extension in sitting AROM -10 degrees  due to increased strength   Time 4   Period Weeks   Status Achieved     PT SHORT TERM GOAL #3   Title right knee flexion PROM >/= 90 degrees to assist in returning to driving   Time 4   Period Weeks   Status Achieved     PT SHORT TERM GOAL #4   Title right knee pain decreased >/= 25% during daily activities   Time 4   Period Weeks   Status Achieved  75%           PT Long Term Goals - 01/25/17 1203      PT LONG TERM GOAL #5   Title right knee strength >/= 4/5 so she is able to go up down steps with minimal difficulty using a railing   Baseline Only difficult due to Lt knee pain   Time 8   Period Weeks   Status Achieved     PT LONG TERM GOAL #6   Title knee extension >/= -5 degrees so she is able to walk with no assistive device and strength increased >/= 4/5   Time 8   Period Weeks   Status Achieved     PT LONG TERM GOAL #7   Title right knee pain decreased >/= 75% with daily activities   Baseline Has to take pain meds   Time 8   Period Weeks   Status On-going                Plan - 01/25/17 1200    Clinical Impression Statement Pt continues to have knee flexion ROM limitation in Rt knee. Pt limited with exercsies due to Lt knee pain. Pt reports no limitation with daily activities due to Rt knee, only limited due to Lt knee. Pt has met all short term goals and most long term goals. Pt will continue to benefit from skilled therapy for knee stability and ROM.   Rehab Potential Excellent   Clinical Impairments Affecting Rehab Potential s/p right total knee replacement 11/30/2016   PT Frequency 3x / week   PT Duration 8 weeks   PT Treatment/Interventions Cryotherapy;Stair training;Gait training;Ultrasound;Moist Heat;Therapeutic activities;Therapeutic exercise;Balance training;Neuromuscular re-education;Patient/family education;Passive range of motion;Scar mobilization;Manual techniques;Energy conservation;Vasopneumatic Device   PT Next Visit Plan Progress standing balance, gait, modalities as needed; work on AROM for right knee strength   Consulted and Agree with Plan of Care Patient      Patient will benefit from skilled therapeutic intervention in order to improve the following deficits and impairments:  Abnormal gait, Decreased range of motion, Difficulty walking, Increased fascial restricitons, Decreased endurance, Decreased activity tolerance, Pain, Decreased scar mobility, Impaired flexibility, Increased edema, Decreased strength, Decreased mobility  Visit Diagnosis: Other abnormalities of gait and mobility  Muscle weakness (generalized)  Acute pain of right knee     Problem List Patient Active Problem List   Diagnosis Date Noted  . 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 Primary osteoarthritis of both knees 11/18/2016  . HTN (hypertension) 11/10/2016  . Cervical radiculopathy 09/19/2016  . Left arm pain 09/19/2016  . Unilateral primary osteoarthritis, right knee 09/30/2015  . Left lateral ankle pain 09/30/2015  . Plantar fasciitis of left foot  12/18/2013  . 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 Hydronephrosis, right 05/30/2013  . Nephrolithiasis 05/30/2013    Mikle Bosworth PTA 01/25/2017, 12:30 PM  [facility] [address], [place], [place], [address] Phone:  [phone]   Fax:  [phone]  Name: Dorcas Melito MRN: [id number] Date of Birth: 1966/02/16

## 2017-01-27 ENCOUNTER — Ambulatory Visit: Payer: Medicaid Other | Admitting: Physical Therapy

## 2017-01-27 DIAGNOSIS — M6281 Muscle weakness (generalized): Secondary | ICD-10-CM

## 2017-01-27 DIAGNOSIS — M25561 Pain in right knee: Secondary | ICD-10-CM

## 2017-01-27 DIAGNOSIS — M25661 Stiffness of right knee, not elsewhere classified: Secondary | ICD-10-CM

## 2017-01-27 DIAGNOSIS — R600 Localized edema: Secondary | ICD-10-CM

## 2017-01-27 DIAGNOSIS — R2689 Other abnormalities of gait and mobility: Secondary | ICD-10-CM

## 2017-01-27 NOTE — Therapy (Signed)
[facility] [address], [address] [place], [place], [address] Phone: [phone]   Fax:  [phone]  Physical Therapy Treatment  Patient Details  Name: Catherine Patrick MRN: [id number] Date of Birth: 02/21/1966 Referring Provider: Dr. Gershon MusselNaiping Xu  Encounter Date: 01/27/2017      PT End of Session - 01/27/17 0853    Visit Number 15   Date for PT Re-Evaluation 02/21/17   Authorization Type CAFA valid until 02/14/2017   PT Start Time 0850   PT Stop Time 0938   PT Time Calculation (min) 48 min   Activity Tolerance Patient limited by pain   Behavior [facility] for tasks assessed/performed      Past Medical History:  Diagnosis Date  . DJD (degenerative joint disease)   . Headache   . History of blood transfusion    2 times after child birth  . History of kidney stones    passed one in 2014- 2017  . Hypertension   . Kidney stones     Past Surgical History:  Procedure Laterality Date  . CESAREAN SECTION    . CESAREAN SECTION    . HERNIA REPAIR    . LIPOSUCTION  2002  . TOTAL KNEE ARTHROPLASTY Right 11/30/2016   Procedure: RIGHT TOTAL KNEE ARTHROPLASTY;  Surgeon: Tarry KosNaiping M Xu, MD;  Location: [facility] OR;  Service: Orthopedics;  Laterality: Right;    There were no vitals filed for this visit.      Subjective Assessment - 01/27/17 [id number]    Subjective Pt reports right knee feeling fine, pain in Lt knee   Pertinent History s/P right TKR 11/30/2016   How long can you walk comfortably? little pain with walking   Patient Stated Goals increase right knee flexible, walk without a walker; return to driving   Currently in Pain? No/denies   Pain Score 0-No pain                         [facility] Adult PT Treatment/Exercise - 01/27/17 0001      Knee/Hip Exercises: Aerobic   Recumbent Bike L0 x8 min  for ROM, therapist present to discuss treatment   Nustep --     Knee/Hip Exercises: Machines for Strengthening    Total Gym Leg Press --     Knee/Hip Exercises: Standing   Forward Step Up Right;2 sets;10 reps;Hand Hold: 1;Step Height: 6"  Onto BOSU   SLS 3 x30 seconds  On blue mat     Knee/Hip Exercises: Seated   Long Arc Quad Strengthening;Right;3 sets;10 reps;Weights   Long Arc Quad Weight 5 lbs.     Knee/Hip Exercises: Supine   Short Arc Quad Sets Strengthening;Right;1 set;10 reps  #5 5 second holds   Heel Slides AAROM;Right;10 reps   Other Supine Knee/Hip Exercises 4 direction hip raises  2x10 Lt knee kicking     Modalities   Modalities Vasopneumatic     Vasopneumatic   Number Minutes Vasopneumatic  15 minutes   Vasopnuematic Location  Knee   Vasopneumatic Pressure Medium   Vasopneumatic Temperature  3 snow flakes                  PT Short Term Goals - 01/09/17 [id number]      PT SHORT TERM GOAL #1   Title Independent with initial HEP   Time 4   Period Weeks   Status Achieved     PT SHORT TERM GOAL #2  Title right knee extension in sitting AROM -10 degrees due to increased strength   Time 4   Period Weeks   Status Achieved     PT SHORT TERM GOAL #3   Title right knee flexion PROM >/= 90 degrees to assist in returning to driving   Time 4   Period Weeks   Status Achieved     PT SHORT TERM GOAL #4   Title right knee pain decreased >/= 25% during daily activities   Time 4   Period Weeks   Status Achieved  75%           PT Long Term Goals - 01/25/17 1203      PT LONG TERM GOAL #5   Title right knee strength >/= 4/5 so she is able to go up down steps with minimal difficulty using a railing   Baseline Only difficult due to Lt knee pain   Time 8   Period Weeks   Status Achieved     PT LONG TERM GOAL #6   Title knee extension >/= -5 degrees so she is able to walk with no assistive device and strength increased >/= 4/5   Time 8   Period Weeks   Status Achieved     PT LONG TERM GOAL #7   Title right knee pain decreased >/= 75% with daily activities    Baseline Has to take pain meds   Time 8   Period Weeks   Status On-going               Plan - 01/27/17 1610    Clinical Impression Statement Pt able to tolerate all exercises well with no increase in pain. Rt knee continues to progress in strength and ROM. Pt limited bu Lt knee pain. Pt will continue to benefit from skilled therapy for knee stability and ROM.    Rehab Potential Excellent   Clinical Impairments Affecting Rehab Potential s/p right total knee replacement 11/30/2016   PT Frequency 3x / week   PT Duration 8 weeks   PT Treatment/Interventions Cryotherapy;Stair training;Gait training;Ultrasound;Moist Heat;Therapeutic activities;Therapeutic exercise;Balance training;Neuromuscular re-education;Patient/family education;Passive range of motion;Scar mobilization;Manual techniques;Energy conservation;Vasopneumatic Device   PT Next Visit Plan Progress standing balance, gait, modalities as needed; work on AROM for right knee strength   Consulted and Agree with Plan of Care Patient      Patient will benefit from skilled therapeutic intervention in order to improve the following deficits and impairments:  Abnormal gait, Decreased range of motion, Difficulty walking, Increased fascial restricitons, Decreased endurance, Decreased activity tolerance, Pain, Decreased scar mobility, Impaired flexibility, Increased edema, Decreased strength, Decreased mobility  Visit Diagnosis: Other abnormalities of gait and mobility  Muscle weakness (generalized)  Acute pain of right knee  Stiffness of right knee, not elsewhere classified  Localized edema     Problem List Patient Active Problem List   Diagnosis Date Noted  . 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 Primary osteoarthritis of both knees 11/18/2016  . HTN (hypertension) 11/10/2016  . Cervical radiculopathy 09/19/2016  . Left arm pain 09/19/2016  . Unilateral primary osteoarthritis, right knee 09/30/2015  . Left  lateral ankle pain 09/30/2015  . Plantar fasciitis of left foot 12/18/2013  . 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 Hydronephrosis, right 05/30/2013  . Nephrolithiasis 05/30/2013    Dessa Phi PTA 01/27/2017, 9:38 AM  [facility] [address] [place], [place], [address] Phone: [phone]  Fax:  [phone]  Name: Catherine Patrick MRN: [id number] Date of Birth: September 15, 1966

## 2017-01-30 ENCOUNTER — Ambulatory Visit: Payer: Medicaid Other

## 2017-01-30 DIAGNOSIS — M25661 Stiffness of right knee, not elsewhere classified: Secondary | ICD-10-CM

## 2017-01-30 DIAGNOSIS — M25561 Pain in right knee: Secondary | ICD-10-CM

## 2017-01-30 DIAGNOSIS — R2689 Other abnormalities of gait and mobility: Secondary | ICD-10-CM

## 2017-01-30 DIAGNOSIS — R600 Localized edema: Secondary | ICD-10-CM

## 2017-01-30 DIAGNOSIS — M6281 Muscle weakness (generalized): Secondary | ICD-10-CM

## 2017-01-30 NOTE — Therapy (Signed)
[facility] [address], [address] [place], [place], [address] Phone: [phone]   Fax:  [phone]  Physical Therapy Treatment  Patient Details  Name: Catherine Patrick MRN: [id number] Date of Birth: Apr 10, 1966 Referring Provider: Dr. Gershon Mussel  Encounter Date: 01/30/2017      PT End of Session - 01/30/17 1434    Visit Number 16   Date for PT Re-Evaluation 02/21/17   Authorization Type CAFA valid until 02/14/2017   PT Start Time 1400   PT Stop Time 1456   PT Time Calculation (min) 56 min   Activity Tolerance Patient tolerated treatment well   Behavior During Therapy [facility] for tasks assessed/performed      Past Medical History:  Diagnosis Date  . DJD (degenerative joint disease)   . Headache   . History of blood transfusion    2 times after child birth  . History of kidney stones    passed one in 2014- 2017  . Hypertension   . Kidney stones     Past Surgical History:  Procedure Laterality Date  . CESAREAN SECTION    . CESAREAN SECTION    . HERNIA REPAIR    . LIPOSUCTION  2002  . TOTAL KNEE ARTHROPLASTY Right 11/30/2016   Procedure: RIGHT TOTAL KNEE ARTHROPLASTY;  Surgeon: Tarry Kos, MD;  Location: [facility] OR;  Service: Orthopedics;  Laterality: Right;    There were no vitals filed for this visit.      Subjective Assessment - 01/30/17 1402    Subjective No pain today.     Currently in Pain? No/denies            [facility] PT Assessment - 01/30/17 0001      PROM   Right Knee Flexion 110                     [facility] Adult PT Treatment/Exercise - 01/30/17 0001      Knee/Hip Exercises: Aerobic   Recumbent Bike L0 x8 min  for ROM, therapist present to discuss treatment     Knee/Hip Exercises: Machines for Strengthening   Total Gym Leg Press Seat 5: RTLE 35# 10x3  no pain     Knee/Hip Exercises: Standing   Forward Step Up Right;2 sets;10 reps;Hand Hold: 1;Step Height: 6"  Onto BOSU   SLS 3 x30  seconds  On blue mat     Knee/Hip Exercises: Seated   Long Arc Quad Strengthening;Right;3 sets;10 reps;Weights   Long Arc Quad Weight 5 lbs.     Knee/Hip Exercises: Supine   Short Arc Quad Sets Strengthening;Right;1 set;10 reps  5# 5 second holds   Heel Slides AAROM;Right;10 reps   Other Supine Knee/Hip Exercises 4 direction hip raises  2x10 Lt knee kicking     Modalities   Modalities Vasopneumatic     Vasopneumatic   Number Minutes Vasopneumatic  15 minutes   Vasopnuematic Location  Knee   Vasopneumatic Pressure Medium   Vasopneumatic Temperature  3 snow flakes                  PT Short Term Goals - 01/09/17 0907      PT SHORT TERM GOAL #1   Title Independent with initial HEP   Time 4   Period Weeks   Status Achieved     PT SHORT TERM GOAL #2   Title right knee extension in sitting AROM -10 degrees due to increased strength   Time 4  Period Weeks   Status Achieved     PT SHORT TERM GOAL #3   Title right knee flexion PROM >/= 90 degrees to assist in returning to driving   Time 4   Period Weeks   Status Achieved     PT SHORT TERM GOAL #4   Title right knee pain decreased >/= 25% during daily activities   Time 4   Period Weeks   Status Achieved  75%           PT Long Term Goals - 01/30/17 1403      PT LONG TERM GOAL #4   Title return to driving due to increased right knee flexion AROM >/= 110 degrees   Status Achieved     PT LONG TERM GOAL #7   Title right knee pain decreased >/= 75% with daily activities   Status Achieved               Plan - 01/30/17 1413    Clinical Impression Statement Pt is progressing well with Rt knee s/p TKA.  Pt is most limited by Lt knee pain at this time.  Pt denies any pain in the Rt knee with activity.  Pt will continue to benfit from skilled PT to strengthen Rt knee to prepare for probable Lt knee replacement soon.     Rehab Potential Excellent   Clinical Impairments Affecting Rehab Potential s/p  right total knee replacement 11/30/2016   PT Frequency 3x / week   PT Duration 8 weeks   PT Treatment/Interventions Cryotherapy;Stair training;Gait training;Ultrasound;Moist Heat;Therapeutic activities;Therapeutic exercise;Balance training;Neuromuscular re-education;Patient/family education;Passive range of motion;Scar mobilization;Manual techniques;Energy conservation;Vasopneumatic Device   PT Next Visit Plan Progress standing balance, gait, modalities as needed; work on AROM for right knee strength   Consulted and Agree with Plan of Care Patient      Patient will benefit from skilled therapeutic intervention in order to improve the following deficits and impairments:  Abnormal gait, Decreased range of motion, Difficulty walking, Increased fascial restricitons, Decreased endurance, Decreased activity tolerance, Pain, Decreased scar mobility, Impaired flexibility, Increased edema, Decreased strength, Decreased mobility  Visit Diagnosis: Other abnormalities of gait and mobility  Muscle weakness (generalized)  Acute pain of right knee  Stiffness of right knee, not elsewhere classified  Localized edema     Problem List Patient Active Problem List   Diagnosis Date Noted  . 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 Primary osteoarthritis of both knees 11/18/2016  . HTN (hypertension) 11/10/2016  . Cervical radiculopathy 09/19/2016  . Left arm pain 09/19/2016  . Unilateral primary osteoarthritis, right knee 09/30/2015  . Left lateral ankle pain 09/30/2015  . Plantar fasciitis of left foot 12/18/2013  . 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 Hydronephrosis, right 05/30/2013  . Nephrolithiasis 05/30/2013     Lorrene ReidKelly Chelsey Redondo, PT 01/30/17 2:46 PM  [facility] [address], [address] [place], [place], [address] Phone: [phone]   Fax:  [phone]  Name: Gwynneth AlimentSulwa Leitz MRN:  [id number] Date of Birth: 12/22/1965

## 2017-02-01 ENCOUNTER — Ambulatory Visit: Payer: Medicaid Other

## 2017-02-01 DIAGNOSIS — M6281 Muscle weakness (generalized): Secondary | ICD-10-CM | POA: Diagnosis not present

## 2017-02-01 DIAGNOSIS — R2689 Other abnormalities of gait and mobility: Secondary | ICD-10-CM

## 2017-02-01 DIAGNOSIS — M25661 Stiffness of right knee, not elsewhere classified: Secondary | ICD-10-CM

## 2017-02-01 DIAGNOSIS — M25561 Pain in right knee: Secondary | ICD-10-CM

## 2017-02-01 DIAGNOSIS — R600 Localized edema: Secondary | ICD-10-CM

## 2017-02-01 NOTE — Therapy (Signed)
[facility] [address] [place], [place], [address] Phone: [phone]   Fax:  [phone]  Physical Therapy Treatment  Patient Details  Name: Catherine Patrick MRN: [id number] Date of Birth: 1966/09/04 Referring Provider: Dr. Gershon Mussel  Encounter Date: 02/01/2017      PT End of Session - 02/01/17 1222    Visit Number 17   Date for PT Re-Evaluation 02/21/17   Authorization Type CAFA valid until 02/14/2017   PT Start Time 1145   PT Stop Time 1240   PT Time Calculation (min) 55 min   Activity Tolerance Patient tolerated treatment well   Behavior During Therapy [facility] for tasks assessed/performed      Past Medical History:  Diagnosis Date  . DJD (degenerative joint disease)   . Headache   . History of blood transfusion    2 times after child birth  . History of kidney stones    passed one in 2014- 2017  . Hypertension   . Kidney stones     Past Surgical History:  Procedure Laterality Date  . CESAREAN SECTION    . CESAREAN SECTION    . HERNIA REPAIR    . LIPOSUCTION  2002  . TOTAL KNEE ARTHROPLASTY Right 11/30/2016   Procedure: RIGHT TOTAL KNEE ARTHROPLASTY;  Surgeon: Tarry Kos, MD;  Location: [facility] OR;  Service: Orthopedics;  Laterality: Right;    There were no vitals filed for this visit.      Subjective Assessment - 02/01/17 1156    Subjective I took my pain medication this morning.  No pain now.     Patient Stated Goals increase right knee flexible, walk without a walker; return to driving   Currently in Pain? No/denies                         [facility] Adult PT Treatment/Exercise - 02/01/17 0001      Knee/Hip Exercises: Aerobic   Recumbent Bike L0 x8 min  for ROM, therapist present to discuss treatment     Knee/Hip Exercises: Machines for Strengthening   Total Gym Leg Press Seat 5: RTLE 35# 10x3  attempted 40#, too heavy     Knee/Hip Exercises: Standing   Forward Step Up Right;2  sets;10 reps;Hand Hold: 1;Step Height: 6"  using BOSU   SLS 3 x30 seconds  On blue mat   Rebounder weight shifting 3 ways 1 min each     Knee/Hip Exercises: Seated   Long Arc Quad Strengthening;Right;3 sets;10 reps;Weights   Long Arc Quad Weight 5 lbs.     Knee/Hip Exercises: Supine   Short Arc Quad Sets Strengthening;Right;1 set;10 reps  5# 5 second holds   Other Supine Knee/Hip Exercises 4 direction hip raises  2x10 Lt knee kicking     Modalities   Modalities Vasopneumatic     Vasopneumatic   Number Minutes Vasopneumatic  15 minutes   Vasopnuematic Location  Knee   Vasopneumatic Pressure Medium   Vasopneumatic Temperature  3 snow flakes                  PT Short Term Goals - 01/09/17 0907      PT SHORT TERM GOAL #1   Title Independent with initial HEP   Time 4   Period Weeks   Status Achieved     PT SHORT TERM GOAL #2   Title right knee extension in sitting AROM -10 degrees due to increased strength  Time 4   Period Weeks   Status Achieved     PT SHORT TERM GOAL #3   Title right knee flexion PROM >/= 90 degrees to assist in returning to driving   Time 4   Period Weeks   Status Achieved     PT SHORT TERM GOAL #4   Title right knee pain decreased >/= 25% during daily activities   Time 4   Period Weeks   Status Achieved  75%           PT Long Term Goals - 01/30/17 1403      PT LONG TERM GOAL #4   Title return to driving due to increased right knee flexion AROM >/= 110 degrees   Status Achieved     PT LONG TERM GOAL #7   Title right knee pain decreased >/= 75% with daily activities   Status Achieved               Plan - 02/01/17 1156    Clinical Impression Statement Pt is progressing well with Rt knee s/p TKA.  Pt is most limited by Lt knee pain at this time.  Pt able to tolerate exercise in the clinic for Rt knee.  Pt with continued Rt knee endurance deficits and edema s/p surgery.  Pt will continue to benefit from skilled PT  for Rt knee strength, flexiblity and edema management on Rt to prepare for probable Lt knee replacement soon.   Rehab Potential Excellent   PT Frequency 3x / week   PT Duration 8 weeks   PT Treatment/Interventions Cryotherapy;Stair training;Gait training;Ultrasound;Moist Heat;Therapeutic activities;Therapeutic exercise;Balance training;Neuromuscular re-education;Patient/family education;Passive range of motion;Scar mobilization;Manual techniques;Energy conservation;Vasopneumatic Device   PT Next Visit Plan Progress standing balance, gait, modalities as needed; work on AROM for right knee strength.  Note for MD 02/06/17 (pt has appointment 02/07/17)   Consulted and Agree with Plan of Care Patient      Patient will benefit from skilled therapeutic intervention in order to improve the following deficits and impairments:  Abnormal gait, Decreased range of motion, Difficulty walking, Increased fascial restricitons, Decreased endurance, Decreased activity tolerance, Pain, Decreased scar mobility, Impaired flexibility, Increased edema, Decreased strength, Decreased mobility  Visit Diagnosis: Other abnormalities of gait and mobility  Muscle weakness (generalized)  Acute pain of right knee  Stiffness of right knee, not elsewhere classified  Localized edema     Problem List Patient Active Problem List   Diagnosis Date Noted  . 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 Primary osteoarthritis of both knees 11/18/2016  . HTN (hypertension) 11/10/2016  . Cervical radiculopathy 09/19/2016  . Left arm pain 09/19/2016  . Unilateral primary osteoarthritis, right knee 09/30/2015  . Left lateral ankle pain 09/30/2015  . Plantar fasciitis of left foot 12/18/2013  . 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 Hydronephrosis, right 05/30/2013  . Nephrolithiasis 05/30/2013     Lorrene ReidKelly Takacs, PT 02/01/17 12:25 PM    [facility] [address], [address] [place], [place], [address] Phone: [phone]   Fax:  [phone]  Name: Catherine Patrick MRN: [id number] Date of Birth: 09/20/1966

## 2017-02-02 MED FILL — IBUPROFEN 800 MG TABLET: 800 | 10 days supply | Qty: 30 | Fill #2

## 2017-02-03 ENCOUNTER — Ambulatory Visit: Payer: Medicaid Other | Attending: Orthopaedic Surgery | Admitting: Physical Therapy

## 2017-02-03 DIAGNOSIS — M6281 Muscle weakness (generalized): Secondary | ICD-10-CM | POA: Diagnosis present

## 2017-02-03 DIAGNOSIS — R600 Localized edema: Secondary | ICD-10-CM | POA: Insufficient documentation

## 2017-02-03 DIAGNOSIS — M25661 Stiffness of right knee, not elsewhere classified: Secondary | ICD-10-CM | POA: Diagnosis present

## 2017-02-03 DIAGNOSIS — M25561 Pain in right knee: Secondary | ICD-10-CM | POA: Insufficient documentation

## 2017-02-03 DIAGNOSIS — R2689 Other abnormalities of gait and mobility: Secondary | ICD-10-CM | POA: Insufficient documentation

## 2017-02-03 NOTE — Therapy (Signed)
[facility] [address], [address] [place], [place], [address] Phone: [phone]   Fax:  [phone]  Physical Therapy Treatment  Patient Details  Name: Catherine Patrick MRN: [id number] Date of Birth: 06/02/1966 Referring Provider: Dr. Gershon MusselNaiping Xu  Encounter Date: 02/03/2017      PT End of Session - 02/03/17 0900    Visit Number 18   Date for PT Re-Evaluation 02/21/17   Authorization Type CAFA valid until 02/14/2017   PT Start Time 0848   PT Stop Time 0940   PT Time Calculation (min) 52 min   Activity Tolerance Patient tolerated treatment well   Behavior During Therapy [facility] for tasks assessed/performed      Past Medical History:  Diagnosis Date  . DJD (degenerative joint disease)   . Headache   . History of blood transfusion    2 times after child birth  . History of kidney stones    passed one in 2014- 2017  . Hypertension   . Kidney stones     Past Surgical History:  Procedure Laterality Date  . CESAREAN SECTION    . CESAREAN SECTION    . HERNIA REPAIR    . LIPOSUCTION  2002  . TOTAL KNEE ARTHROPLASTY Right 11/30/2016   Procedure: RIGHT TOTAL KNEE ARTHROPLASTY;  Surgeon: Tarry KosNaiping M Xu, MD;  [facility];  Service: Orthopedics;  Laterality: Right;    There were no vitals filed for this visit.      Subjective Assessment - 02/03/17 0853    Subjective Right knee is doing okay.  I am hoping to get the Left one done in 2-3 weeks. I will see the doctor on Tuesday.   Pertinent History s/P right TKR 11/30/2016   Currently in Pain? Yes   Pain Score 4    Pain Location Knee   Pain Orientation Right   Pain Descriptors / Indicators Sore   Pain Type Surgical pain   Pain Onset More than a month ago   Pain Frequency Intermittent   Aggravating Factors  bending   Pain Relieving Factors rest   Multiple Pain Sites No            [facility] PT Assessment - 02/03/17 0001      PROM   Right Knee Extension 0   Right Knee  Flexion 110                     [facility] Adult PT Treatment/Exercise - 02/03/17 0001      Knee/Hip Exercises: Aerobic   Recumbent Bike L0 x5 min  for ROM, therapist present to discuss treatment   Nustep L3 x 5 minutes  Legs only: PT present to discuss goals     Knee/Hip Exercises: Machines for Strengthening   Total Gym Leg Press Seat 5: RTLE 35# 10x3  attempted 40#, too heavy     Knee/Hip Exercises: Standing   Forward Step Up Right;2 sets;10 reps;Hand Hold: 1;Step Height: 6"  using BOSU   SLS 3 x30 seconds  On blue mat   Rebounder weight shifting 3 ways 1 min each     Knee/Hip Exercises: Seated   Long Arc Quad --   Long Arc Quad Edison InternationalWeight --     Vasopneumatic   Number Minutes Vasopneumatic  15 minutes   Vasopnuematic Location  Knee   Vasopneumatic Pressure Medium   Vasopneumatic Temperature  3 snow flakes  PT Short Term Goals - 02/03/17 0905      PT SHORT TERM GOAL #1   Title Independent with initial HEP   Time 4   Period Weeks   Status Achieved     PT SHORT TERM GOAL #2   Title right knee extension in sitting AROM -10 degrees due to increased strength   Time 4   Period Weeks   Status Achieved     PT SHORT TERM GOAL #3   Title right knee flexion PROM >/= 90 degrees to assist in returning to driving   Time 4   Period Weeks   Status Achieved     PT SHORT TERM GOAL #4   Title right knee pain decreased >/= 25% during daily activities   Time 4   Period Weeks   Status Achieved           PT Long Term Goals - 02/03/17 4098      PT LONG TERM GOAL #1   Title independent with HEP   Time 8   Period Weeks   Status On-going     PT LONG TERM GOAL #2   Title walk without a walker in the community with minimal gait deficits   Time 8   Period Weeks   Status Achieved     PT LONG TERM GOAL #3   Title bring right leg in and out of bed without left leg helping it   Time 8   Period Weeks   Status Achieved     PT LONG TERM  GOAL #4   Title return to driving due to increased right knee flexion AROM >/= 110 degrees   Time 8   Period Weeks   Status Achieved     PT LONG TERM GOAL #5   Title right knee strength >/= 4/5 so she is able to go up down steps with minimal difficulty using a railing   Time 8   Period Weeks   Status Achieved     PT LONG TERM GOAL #6   Title knee extension >/= -5 degrees so she is able to walk with no assistive device and strength increased >/= 4/5   Time 8   Period Weeks   Status Achieved     PT LONG TERM GOAL #7   Title right knee pain decreased >/= 75% with daily activities   Baseline Has to take pain meds (ibuprophen 1x/day at the most)   Time 8   Period Weeks   Status Achieved               Plan - 02/03/17 0911  Clinical impression: The patient has demonstrated increased strength and ROM. She continues to benefit from skilled PT for progression of her HEP for maximum strength and ROM benefits and continues to benefit from edema management of Rt knee.   Rehab Potential Excellent   Clinical Impairments Affecting Rehab Potential s/p right total knee replacement 11/30/2016   PT Treatment/Interventions Cryotherapy;Stair training;Gait training;Ultrasound;Moist Heat;Therapeutic activities;Therapeutic exercise;Balance training;Neuromuscular re-education;Patient/family education;Passive range of motion;Scar mobilization;Manual techniques;Energy conservation;Vasopneumatic Device   PT Next Visit Plan Progress standing balance, gait, modalities as needed; work on AROM for right knee strength. (MD appointment 02/07/17)   Consulted and Agree with Plan of Care Patient      Patient will benefit from skilled therapeutic intervention in order to improve the following deficits and impairments:  Abnormal gait, Decreased range of motion, Difficulty walking, Increased fascial restricitons, Decreased endurance, Decreased activity tolerance, Pain, Decreased scar  mobility, Impaired flexibility,  Increased edema, Decreased strength, Decreased mobility  Visit Diagnosis: Other abnormalities of gait and mobility  Muscle weakness (generalized)  Acute pain of right knee  Stiffness of right knee, not elsewhere classified  Localized edema     Problem List Patient Active Problem List   Diagnosis Date Noted  . 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 Primary osteoarthritis of both knees 11/18/2016  . HTN (hypertension) 11/10/2016  . Cervical radiculopathy 09/19/2016  . Left arm pain 09/19/2016  . Unilateral primary osteoarthritis, right knee 09/30/2015  . Left lateral ankle pain 09/30/2015  . Plantar fasciitis of left foot 12/18/2013  . 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 Hydronephrosis, right 05/30/2013  . Nephrolithiasis 05/30/2013    Vincente Poli, PT 02/03/2017, 9:27 AM  [facility] [address], [address] [place], [place], [address] Phone: [phone]   Fax:  [phone]  Name: Catherine Patrick MRN: [id number] Date of Birth: [birth year]

## 2017-02-06 ENCOUNTER — Encounter: Payer: Self-pay | Admitting: Physical Therapy

## 2017-02-06 ENCOUNTER — Ambulatory Visit: Payer: Medicaid Other | Admitting: Physical Therapy

## 2017-02-06 DIAGNOSIS — R2689 Other abnormalities of gait and mobility: Secondary | ICD-10-CM | POA: Diagnosis not present

## 2017-02-06 DIAGNOSIS — R600 Localized edema: Secondary | ICD-10-CM

## 2017-02-06 DIAGNOSIS — M6281 Muscle weakness (generalized): Secondary | ICD-10-CM

## 2017-02-06 DIAGNOSIS — M25561 Pain in right knee: Secondary | ICD-10-CM

## 2017-02-06 DIAGNOSIS — M25661 Stiffness of right knee, not elsewhere classified: Secondary | ICD-10-CM

## 2017-02-06 NOTE — Therapy (Signed)
[facility] [address] [place], [place], [address] Phone: [phone]   Fax:  [phone]  Physical Therapy Treatment  Patient Details  Name: Catherine Patrick MRN: [id number] Date of Birth: [birth year] Referring Provider: Dr. Frankey Shown  Encounter Date: 02/06/2017      PT End of Session - 02/06/17 0852    Visit Number 19   Date for PT Re-Evaluation 02/21/17   Authorization Type CAFA valid until 02/14/2017   PT Start Time 0846   PT Stop Time 0924   PT Time Calculation (min) 38 min   Activity Tolerance Patient tolerated treatment well   Behavior During Therapy [facility] for tasks assessed/performed      Past Medical History:  Diagnosis Date  . DJD (degenerative joint disease)   . Headache   . History of blood transfusion    2 times after child birth  . History of kidney stones    passed one in 2014- 2017  . Hypertension   . Kidney stones     Past Surgical History:  Procedure Laterality Date  . CESAREAN SECTION    . CESAREAN SECTION    . HERNIA REPAIR    . LIPOSUCTION  2002  . TOTAL KNEE ARTHROPLASTY Right 11/30/2016   Procedure: RIGHT TOTAL KNEE ARTHROPLASTY;  Surgeon: Leandrew Koyanagi, MD;  Location: [place];  Service: Orthopedics;  Laterality: Right;    There were no vitals filed for this visit.      Subjective Assessment - 02/06/17 0851    Subjective Myu left knee just hurts. Right knee is fine just stiff sometimes.    Pertinent History s/P right TKR 11/30/2016   How long can you walk comfortably? little pain with walking   Patient Stated Goals increase right knee flexible, walk without a walker; return to driving   Currently in Pain? No/denies   Pain Score 0-No pain                         [facility] Adult PT Treatment/Exercise - 02/06/17 0001      Knee/Hip Exercises: Stretches   Active Hamstring Stretch Right;3 reps;10 seconds  green strap   Quad Stretch Right;2 reps;10 seconds   Gastroc  Stretch Right;2 reps;10 seconds     Knee/Hip Exercises: Aerobic   Recumbent Bike L0 x8 min  for ROM, therapist present to discuss treatment   Nustep L3 x 8 minutes  Legs only: PT present to discuss goals     Knee/Hip Exercises: Machines for Strengthening   Cybex Knee Extension 2x10 #10   Cybex Knee Flexion 2x10 #10   Total Gym Leg Press Seat 5: RTLE 35# 10x3  no pain     Knee/Hip Exercises: Standing   Forward Step Up Right;2 sets;10 reps;Hand Hold: 1;Step Height: 6"  using BOSU   SLS 3 x30 seconds  On blue mat                  PT Short Term Goals - 02/03/17 0905      PT SHORT TERM GOAL #1   Title Independent with initial HEP   Time 4   Period Weeks   Status Achieved     PT SHORT TERM GOAL #2   Title right knee extension in sitting AROM -10 degrees due to increased strength   Time 4   Period Weeks   Status Achieved     PT SHORT TERM GOAL #3   Title right knee  flexion PROM >/= 90 degrees to assist in returning to driving   Time 4   Period Weeks   Status Achieved     PT SHORT TERM GOAL #4   Title right knee pain decreased >/= 25% during daily activities   Time 4   Period Weeks   Status Achieved           PT Long Term Goals - 02/06/17 2778      PT LONG TERM GOAL #1   Title independent with HEP   Time 8   Period Weeks   Status On-going               Plan - 02/06/17 0903    Clinical Impression Statement Pt is seeing MD tomorrow to discuss progress and Lt knee. Pt is limited with standing and gait exercises in therapy due to Lt knee pain. Pt has met most goals for Rt knee but continues to have weakness and antaligic gait due to Lt knee pain. Pt will continue to benefit from skilled therapy for LE strengthening and stability.    Rehab Potential Excellent   Clinical Impairments Affecting Rehab Potential s/p right total knee replacement 11/30/2016   PT Frequency 3x / week   PT Duration 8 weeks   PT Treatment/Interventions Cryotherapy;Stair  training;Gait training;Ultrasound;Moist Heat;Therapeutic activities;Therapeutic exercise;Balance training;Neuromuscular re-education;Patient/family education;Passive range of motion;Scar mobilization;Manual techniques;Energy conservation;Vasopneumatic Device      Patient will benefit from skilled therapeutic intervention in order to improve the following deficits and impairments:  Abnormal gait, Decreased range of motion, Difficulty walking, Increased fascial restricitons, Decreased endurance, Decreased activity tolerance, Pain, Decreased scar mobility, Impaired flexibility, Increased edema, Decreased strength, Decreased mobility  Visit Diagnosis: Other abnormalities of gait and mobility  Muscle weakness (generalized)  Acute pain of right knee  Stiffness of right knee, not elsewhere classified  Localized edema     Problem List Patient Active Problem List   Diagnosis Date Noted  . 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 Primary osteoarthritis of both knees 11/18/2016  . HTN (hypertension) 11/10/2016  . Cervical radiculopathy 09/19/2016  . Left arm pain 09/19/2016  . Unilateral primary osteoarthritis, right knee 09/30/2015  . Left lateral ankle pain 09/30/2015  . Plantar fasciitis of left foot 12/18/2013  . 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 Hydronephrosis, right 05/30/2013  . Nephrolithiasis 05/30/2013    Mikle Bosworth PTA 02/06/2017, 9:22 AM  [facility] [address], [place], [place], [address] Phone: [phone]   Fax:  [phone]  Name: Catherine Patrick MRN: [id number] Date of Birth: 1966-05-07

## 2017-02-07 ENCOUNTER — Ambulatory Visit (INDEPENDENT_AMBULATORY_CARE_PROVIDER_SITE_OTHER): Payer: Self-pay | Admitting: Orthopaedic Surgery

## 2017-02-07 ENCOUNTER — Encounter (INDEPENDENT_AMBULATORY_CARE_PROVIDER_SITE_OTHER): Payer: Self-pay | Admitting: Orthopaedic Surgery

## 2017-02-07 DIAGNOSIS — M1711 Unilateral primary osteoarthritis, right knee: Secondary | ICD-10-CM

## 2017-02-07 DIAGNOSIS — M1712 Unilateral primary osteoarthritis, left knee: Secondary | ICD-10-CM

## 2017-02-07 NOTE — Progress Notes (Signed)
Office Visit Note   Patient: Catherine Patrick           Date of Birth: 11/21/1966           MRN: [id number] Visit Date: 02/07/2017              Requested by: Dessa PhiJosalyn Funches, MD [address] [place], [place] [address] PCP: Lora PaulaFUNCHES, JOSALYN C, MD   Assessment & Plan: Visit Diagnoses: No diagnosis found.  Plan: Right knee is doing well and progressing with physical therapy well. At this point Catherine Patrick would like to proceed with a left total knee replacement. Catherine Patrick understands risks benefits alternatives. We'll plan on doing this in the near future.  Follow-Up Instructions: No Follow-up on file.   Orders:  No orders of the defined types were placed in this encounter.  No orders of the defined types were placed in this encounter.     Procedures: No procedures performed   Clinical Data: No additional findings.   Subjective: Chief Complaint  Patient presents with  . Right Knee - Routine Post Op    Patient comes in today for her right knee replacement follow-up and also discussion of left knee replacement. Catherine Patrick is doing well from the right knee. Catherine Patrick'll like to have the left knee done as soon as possible.    Review of Systems  Constitutional: Negative.   HENT: Negative.   Eyes: Negative.   Respiratory: Negative.   Cardiovascular: Negative.   Endocrine: Negative.   Musculoskeletal: Negative.   Neurological: Negative.   Hematological: Negative.   Psychiatric/Behavioral: Negative.   All other systems reviewed and are negative.    Objective: Vital Signs: LMP 03/19/2014   Physical Exam  Constitutional: Catherine Patrick is oriented to person, place, and time. Catherine Patrick appears well-developed and well-nourished.  Pulmonary/Chest: Effort normal.  Neurological: Catherine Patrick is alert and oriented to person, place, and time.  Skin: Skin is warm. Capillary refill takes less than 2 seconds.  Psychiatric: Catherine Patrick has a normal mood and affect. Her behavior is normal. Judgment and thought content normal.  Nursing  note and vitals reviewed.   Ortho Exam Exam of the bilateral knees stable. Specialty Comments:  No specialty comments available.  Imaging: No results found.   PMFS History: Patient Active Problem List   Diagnosis Date Noted  . 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 Primary osteoarthritis of both knees 11/18/2016  . HTN (hypertension) 11/10/2016  . Cervical radiculopathy 09/19/2016  . Left arm pain 09/19/2016  . Unilateral primary osteoarthritis, right knee 09/30/2015  . Left lateral ankle pain 09/30/2015  . Plantar fasciitis of left foot 12/18/2013  . 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 Hydronephrosis, right 05/30/2013  . Nephrolithiasis 05/30/2013   Past Medical History:  Diagnosis Date  . DJD (degenerative joint disease)   . Headache   . History of blood transfusion    2 times after child birth  . History of kidney stones    passed one in 2014- 2017  . Hypertension   . Kidney stones     Family History  Problem Relation Age of Onset  . Cancer Neg Hx   . Diabetes Neg Hx   . Heart failure Neg Hx   . Hyperlipidemia Neg Hx   . Hypertension Neg Hx     Past Surgical History:  Procedure Laterality Date  . CESAREAN SECTION    . CESAREAN SECTION    . HERNIA REPAIR    . LIPOSUCTION  2002  . TOTAL KNEE ARTHROPLASTY Right 11/30/2016   Procedure: RIGHT TOTAL KNEE ARTHROPLASTY;  Surgeon: Tarry Kos, MD;  [facility];  Service: Orthopedics;  Laterality: Right;   Social History   Occupational History  . Not on file.   Social History Main Topics  . Smoking status: Never Smoker  . Smokeless tobacco: Never Used  . Alcohol use No  . Drug use: No  . Sexual activity: Yes    Birth control/ protection: None

## 2017-02-08 ENCOUNTER — Ambulatory Visit: Payer: Medicaid Other | Admitting: Physical Therapy

## 2017-02-08 ENCOUNTER — Encounter: Payer: Self-pay | Admitting: Physical Therapy

## 2017-02-08 DIAGNOSIS — R2689 Other abnormalities of gait and mobility: Secondary | ICD-10-CM | POA: Diagnosis not present

## 2017-02-08 DIAGNOSIS — M25661 Stiffness of right knee, not elsewhere classified: Secondary | ICD-10-CM

## 2017-02-08 DIAGNOSIS — R600 Localized edema: Secondary | ICD-10-CM

## 2017-02-08 DIAGNOSIS — M6281 Muscle weakness (generalized): Secondary | ICD-10-CM

## 2017-02-08 DIAGNOSIS — M25561 Pain in right knee: Secondary | ICD-10-CM

## 2017-02-08 NOTE — Therapy (Signed)
[facility] [address] [place], [place], [address] Phone: [phone]   Fax:  [phone]  Physical Therapy Treatment  Patient Details  Name: Catherine Patrick MRN: [id number] Date of Birth: [birth year] Referring Provider: Dr. Frankey Shown  Encounter Date: 02/08/2017      PT End of Session - 02/08/17 1155    Visit Number 20   Date for PT Re-Evaluation 02/21/17   Authorization Type CAFA valid until 02/14/2017   PT Start Time 1146   PT Stop Time 1220   PT Time Calculation (min) 34 min   Activity Tolerance Patient tolerated treatment well   Behavior During Therapy [facility] for tasks assessed/performed      Past Medical History:  Diagnosis Date  . DJD (degenerative joint disease)   . Headache   . History of blood transfusion    2 times after child birth  . History of kidney stones    passed one in 2014- 2017  . Hypertension   . Kidney stones     Past Surgical History:  Procedure Laterality Date  . CESAREAN SECTION    . CESAREAN SECTION    . HERNIA REPAIR    . LIPOSUCTION  2002  . TOTAL KNEE ARTHROPLASTY Right 11/30/2016   Procedure: RIGHT TOTAL KNEE ARTHROPLASTY;  Surgeon: Leandrew Koyanagi, MD;  Location: [place];  Service: Orthopedics;  Laterality: Right;    There were no vitals filed for this visit.      Subjective Assessment - 02/08/17 1154    Subjective Pt saw MD yesterday and is scheduled to have Lt knee replacment in April   Pertinent History s/P right TKR 11/30/2016   How long can you walk comfortably? little pain with walking   Patient Stated Goals increase right knee flexible, walk without a walker; return to driving   Currently in Pain? No/denies   Pain Score 0-No pain            [facility] PT Assessment - 02/08/17 0001      Assessment   Medical Diagnosis M17.11 unilateral primary osteoarthritis, right kneee   Referring Provider Dr. Frankey Shown   Onset Date/Surgical Date 11/30/16   Prior Therapy 3 home  health visits     Precautions   Precautions None     Restrictions   Weight Bearing Restrictions No     Balance Screen   Has the patient fallen in the past 6 months No   Has the patient had a decrease in activity level because of a fear of falling?  No   Is the patient reluctant to leave their home because of a fear of falling?  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, walking     Cognition   Overall Cognitive Status Within Functional Limits for tasks assessed     Observation/Other Assessments   Skin Integrity scar on right knee has decreased mobility   Focus on Therapeutic Outcomes (FOTO)  20%  limitation     Posture/Postural Control   Posture/Postural Control No significant limitations     PROM   Right Knee Extension 0   Right Knee Flexion 110     Strength   Right Hip Flexion 5/5   Right Hip Extension 5/5   Right Hip ABduction 5/5   Right/Left Knee Right   Right Knee Flexion 4+/5   Right Knee Extension 5/5   Right/Left Ankle Right   Right Ankle Plantar Flexion 5/5     Transfers   Transfers Sit to Supine   Sit to Supine 7: Independent                     [facility] Adult PT Treatment/Exercise - 02/08/17 0001      Knee/Hip Exercises: Stretches   Pension scheme manager reps;10 seconds   Gastroc Stretch Right;2 reps;10 seconds     Knee/Hip Exercises: Aerobic   Recumbent Bike L0 x7 min  for ROM, therapist present to discuss treatment     Knee/Hip Exercises: Machines for Strengthening    Cybex Knee Extension 2x10 #10   Cybex Knee Flexion 2x10 #10   Total Gym Leg Press Seat 5: RTLE 35# 10x3  no pain     Knee/Hip Exercises: Standing   Forward Step Up Right;2 sets;10 reps;Hand Hold: 1;Step Height: 6"  using BOSU   SLS 3 x30 seconds  On blue mat                  PT Short Term Goals - 02/08/17 1157      PT SHORT TERM GOAL #1   Title Independent with initial HEP   Time 4   Status Achieved     PT SHORT TERM GOAL #2   Title right knee extension in sitting AROM -10 degrees due to increased strength   Status Achieved     PT SHORT TERM GOAL #3   Title right knee flexion PROM >/= 90 degrees to assist in returning to driving   Status Achieved     PT SHORT TERM GOAL #4   Title right knee pain decreased >/= 25% during daily activities   Status Achieved           PT Long Term Goals - 02/08/17 1157      PT LONG TERM GOAL #1   Title independent with HEP   Status Achieved     PT LONG TERM GOAL #2   Title walk without a walker in the community with minimal gait deficits   Status Achieved     PT LONG TERM GOAL #3   Title bring right leg in and out of bed without left leg helping it   Status Achieved     PT LONG TERM GOAL #4   Title return to driving due to increased right knee flexion AROM >/= 110 degrees   Status Achieved     PT LONG TERM GOAL #5   Title right knee strength >/= 4/5 so she is able to go up down steps with minimal difficulty using a railing   Status Achieved     PT LONG TERM GOAL #6   Title knee extension >/= -5 degrees so she is able to walk with no assistive device and strength increased >/= 4/5   Status Achieved     PT LONG TERM GOAL #7   Title right knee pain decreased >/= 75% with daily activities   Status Achieved  Plan - 02/08/17 1158    Clinical Impression Statement Pt will discharge from therapy today. Pt has met all short term and all long term goals for Rt knee. Pt has good understanding of  home exercise program and has been educated on importance of continueing to strengthen at home. Pt FOTO score indidcated a 20% limitation due to Right knee. Pt is ready to continue exercises independently.       Patient will benefit from skilled therapeutic intervention in order to improve the following deficits and impairments:     Visit Diagnosis: Other abnormalities of gait and mobility  Muscle weakness (generalized)  Acute pain of right knee  Stiffness of right knee, not elsewhere classified  Localized edema     Problem List Patient Active Problem List   Diagnosis Date Noted  . 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 Primary osteoarthritis of both knees 11/18/2016  . HTN (hypertension) 11/10/2016  . Cervical radiculopathy 09/19/2016  . Left arm pain 09/19/2016  . Unilateral primary osteoarthritis, right knee 09/30/2015  . Left lateral ankle pain 09/30/2015  . Plantar fasciitis of left foot 12/18/2013  . 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 Hydronephrosis, right 05/30/2013  . Nephrolithiasis 05/30/2013    Mikle Bosworth, PTA 02/08/17 12:30 PM PHYSICAL THERAPY DISCHARGE SUMMARY  Visits from Start of Care: 20  Current functional level related to goals / functional outcomes: See above for current status.  Pt will have Lt knee replaced next month.     Remaining deficits: See above.  Limited by Lt knee at this time.    Education / Equipment: HEP Plan: Patient agrees to discharge.  Patient goals were met. Patient is being discharged due to meeting the stated rehab goals.  ?????        Sigurd Sos, PT 02/08/17 1:15 PM   [facility] [address] [place], [place], [address] Phone: [phone]   Fax:  [phone]  Name: Catherine Patrick MRN: [id number] Date of Birth: 09/23/1966

## 2017-02-10 ENCOUNTER — Encounter: Payer: Medicaid Other | Admitting: Physical Therapy

## 2017-02-13 ENCOUNTER — Ambulatory Visit: Payer: Medicaid Other

## 2017-02-17 ENCOUNTER — Encounter: Payer: Medicaid Other | Admitting: Physical Therapy

## 2017-02-22 ENCOUNTER — Ambulatory Visit: Payer: Medicaid Other

## 2017-03-01 ENCOUNTER — Ambulatory Visit: Payer: Medicaid Other

## 2017-03-09 ENCOUNTER — Other Ambulatory Visit (INDEPENDENT_AMBULATORY_CARE_PROVIDER_SITE_OTHER): Payer: Self-pay | Admitting: Orthopaedic Surgery

## 2017-03-09 DIAGNOSIS — M1712 Unilateral primary osteoarthritis, left knee: Secondary | ICD-10-CM

## 2017-03-14 ENCOUNTER — Encounter (HOSPITAL_COMMUNITY)
Admission: RE | Admit: 2017-03-14 | Discharge: 2017-03-14 | Disposition: A | Discharge status: Home / Self Care | Payer: Medicaid Other | Source: Ambulatory Visit | Attending: Orthopaedic Surgery | Admitting: Orthopaedic Surgery

## 2017-03-14 ENCOUNTER — Encounter (HOSPITAL_COMMUNITY): Payer: Self-pay

## 2017-03-14 DIAGNOSIS — N200 Calculus of kidney: Secondary | ICD-10-CM | POA: Insufficient documentation

## 2017-03-14 DIAGNOSIS — I10 Essential (primary) hypertension: Secondary | ICD-10-CM | POA: Insufficient documentation

## 2017-03-14 DIAGNOSIS — Z01812 Encounter for preprocedural laboratory examination: Secondary | ICD-10-CM | POA: Diagnosis not present

## 2017-03-14 DIAGNOSIS — N1330 Unspecified hydronephrosis: Secondary | ICD-10-CM | POA: Diagnosis not present

## 2017-03-14 DIAGNOSIS — M170 Bilateral primary osteoarthritis of knee: Secondary | ICD-10-CM | POA: Insufficient documentation

## 2017-03-14 DIAGNOSIS — M722 Plantar fascial fibromatosis: Secondary | ICD-10-CM | POA: Insufficient documentation

## 2017-03-14 LAB — CBC WITH DIFFERENTIAL/PLATELET
BASOS PCT: 0 %
Basophils Absolute: 0 10*3/uL (ref 0.0–0.1)
EOS PCT: 2 %
Eosinophils Absolute: 0.2 10*3/uL (ref 0.0–0.7)
HEMATOCRIT: 42.7 % (ref 36.0–46.0)
Hemoglobin: 13.8 g/dL (ref 12.0–15.0)
LYMPHS PCT: 39 %
Lymphs Abs: 3.6 10*3/uL (ref 0.7–4.0)
MCH: 28.4 pg (ref 26.0–34.0)
MCHC: 32.3 g/dL (ref 30.0–36.0)
MCV: 87.9 fL (ref 78.0–100.0)
MONO ABS: 0.5 10*3/uL (ref 0.1–1.0)
Monocytes Relative: 6 %
NEUTROS ABS: 5 10*3/uL (ref 1.7–7.7)
Neutrophils Relative %: 53 %
Platelets: 226 10*3/uL (ref 150–400)
RBC: 4.86 MIL/uL (ref 3.87–5.11)
RDW: 14.1 % (ref 11.5–15.5)
WBC: 9.4 10*3/uL (ref 4.0–10.5)

## 2017-03-14 LAB — URINALYSIS, ROUTINE W REFLEX MICROSCOPIC
Bilirubin Urine: NEGATIVE
GLUCOSE, UA: NEGATIVE mg/dL
Hgb urine dipstick: NEGATIVE
KETONES UR: NEGATIVE mg/dL
Nitrite: NEGATIVE
PROTEIN: NEGATIVE mg/dL
Specific Gravity, Urine: 1.023 (ref 1.005–1.030)
pH: 5 (ref 5.0–8.0)

## 2017-03-14 LAB — COMPREHENSIVE METABOLIC PANEL
ALT: 17 U/L (ref 14–54)
ANION GAP: 11 (ref 5–15)
AST: 25 U/L (ref 15–41)
Albumin: 3.9 g/dL (ref 3.5–5.0)
Alkaline Phosphatase: 154 U/L — ABNORMAL HIGH (ref 38–126)
BILIRUBIN TOTAL: 0.3 mg/dL (ref 0.3–1.2)
BUN: 17 mg/dL (ref 6–20)
CHLORIDE: 105 mmol/L (ref 101–111)
CO2: 27 mmol/L (ref 22–32)
Calcium: 9.5 mg/dL (ref 8.9–10.3)
Creatinine, Ser: 0.6 mg/dL (ref 0.44–1.00)
GFR calc Af Amer: >60 mL/min (ref >60)
Glucose, Bld: 151 mg/dL — ABNORMAL HIGH (ref 65–99)
POTASSIUM: 3.6 mmol/L (ref 3.5–5.1)
Sodium: 143 mmol/L (ref 135–145)
TOTAL PROTEIN: 7.1 g/dL (ref 6.5–8.1)

## 2017-03-14 LAB — PROTIME-INR
INR: 1.12
PROTHROMBIN TIME: 14.5 s (ref 11.4–15.2)

## 2017-03-14 LAB — TYPE AND SCREEN
ABO/RH(D): O POS
ANTIBODY SCREEN: NEGATIVE

## 2017-03-14 LAB — C-REACTIVE PROTEIN: CRP: <0.8 mg/dL (ref <1.0)

## 2017-03-14 LAB — SURGICAL PCR SCREEN
MRSA, PCR: NEGATIVE
STAPHYLOCOCCUS AUREUS: NEGATIVE

## 2017-03-14 LAB — APTT: APTT: 28 s (ref 24–36)

## 2017-03-14 LAB — ABO/RH: ABO/RH(D): O POS

## 2017-03-14 LAB — SEDIMENTATION RATE: SED RATE: 15 mm/h (ref 0–22)

## 2017-03-14 NOTE — Pre-Procedure Instructions (Addendum)
Arda Daggs  03/14/2017      [id number] - [place], [place] - [address] AT [facility] [address] [place] [address] Phone: [phone] Fax: [phone]  [facility] [address] [place] - [address] [place] [address] Phone: [phone] Fax: [phone]  [facility] - [place], [place] - [address] [place] [address] Phone: [phone] Fax: [phone]    Your procedure is scheduled on April 12  Report to [facility] Admitting at 1150 A.M.  Call this number if you have problems the morning of surgery:  [phone]   Remember:  Do not eat food or drink liquids after midnight.   Take these medicines the morning of surgery with A SIP OF WATER NONE  Take all other medications as prescribed except 7 days prior to surgery STOP taking any Aspirin, Aleve, Naproxen, Ibuprofen, Motrin, Advil, Goody's, BC's, all herbal medications, fish oil, and all vitamins   Do not wear jewelry, make-up or nail polish.  Do not wear lotions, powders, or perfumes, or deoderant.  Do not shave 48 hours prior to surgery.  Men may shave face and neck.  Do not bring valuables to the hospital.  [facility] is not responsible for any belongings or valuables.  Contacts, dentures or bridgework may not be worn into surgery.  Leave your suitcase in the car.  After surgery it may be brought to your room.  For patients admitted to the hospital, discharge time will be determined by your treatment team.  Patients discharged the day of surgery will not be allowed to drive home.    Special instructions:   [facility]- Preparing For Surgery  Before surgery, you can play an important role. Because skin is not sterile, your skin needs to be as free of germs as possible. You can reduce the number of germs on your skin by washing with CHG (chlorahexidine  gluconate) Soap before surgery.  CHG is an antiseptic cleaner which kills germs and bonds with the skin to continue killing germs even after washing.  Please do not use if you have an allergy to CHG or antibacterial soaps. If your skin becomes reddened/irritated stop using the CHG.  Do not shave (including legs and underarms) for at least 48 hours prior to first CHG shower. It is OK to shave your face.  Please follow these instructions carefully.   1. Shower the NIGHT BEFORE SURGERY and the MORNING OF SURGERY with CHG.   2. If you chose to wash your hair, wash your hair first as usual with your normal shampoo.  3. After you shampoo, rinse your hair and body thoroughly to remove the shampoo.  4. Use CHG as you would any other liquid soap. You can apply CHG directly to the skin and wash gently with a scrungie or a clean washcloth.   5. Apply the CHG Soap to your body ONLY FROM THE NECK DOWN.  Do not use on open wounds or open sores. Avoid contact with your eyes, ears, mouth and genitals (private parts). Wash genitals (private parts) with your normal soap.  6. Wash thoroughly, paying special attention to the area where your surgery will be performed.  7. Thoroughly rinse your body with warm water from the neck down.  8. DO NOT shower/wash with your normal soap after using and rinsing off the CHG Soap.  9.  Pat yourself dry with a CLEAN TOWEL.   10. Wear CLEAN PAJAMAS   11. Place CLEAN SHEETS on your bed the night of your first shower and DO NOT SLEEP WITH PETS.    Day of Surgery: Do not apply any deodorants/lotions. Please wear clean clothes to the hospital/surgery center.      Please read over the following fact sheets that you were given.

## 2017-03-14 NOTE — Progress Notes (Signed)
PCP - Josalyn Funches Cardiologist - denies  Chest x-ray - not needed EKG - 12/01/16 Stress Test - denies ECHO - denies Cardiac Cath - denies     Patient denies shortness of breath, fever, cough and chest pain at PAT appointment   Patient verbalized understanding of instructions that was given to them at the PAT appointment. Patient expressed that there were no further questions.  Patient was also instructed that they will need to review over the PAT instructions again at home before the surgery.

## 2017-03-15 MED ORDER — TRANEXAMIC ACID 1000 MG/10ML IV SOLN
1000.0000 mg | INTRAVENOUS | Status: AC
  Administered 2017-03-16: 1000 mg via INTRAVENOUS
  Filled 2017-03-15: qty 10

## 2017-03-15 MED ORDER — CEFAZOLIN SODIUM-DEXTROSE 2-4 GM/100ML-% IV SOLN
2.0000 g | INTRAVENOUS | Status: AC
  Administered 2017-03-16: 2 g via INTRAVENOUS
  Filled 2017-03-15 (×2): qty 100

## 2017-03-15 MED ORDER — BUPIVACAINE LIPOSOME 1.3 % IJ SUSP
20.0000 mL | INTRAMUSCULAR | Status: DC
  Filled 2017-03-15: qty 20

## 2017-03-15 NOTE — Progress Notes (Signed)
Anesthesia Chart Review: Patient is a 51 year old female scheduled for left TKA tomorrow by Dr. Erlinda Hong.  History includes non-smoker, HTN, nephrolithiasis, DJD, headaches, right TKA 11/30/16. BMI is consistent with obesity.   PCP is Dr. Boykin Nearing.  BP (!) 153/95   Pulse (!) 118 Comment: notified Jessica RN  Temp 36.7 C   Resp 20   Ht 4' 9"  (1.448 m)   Wt 171 lb 12.8 oz (77.9 kg)   LMP 03/19/2014   SpO2 99%   BMI 37.18 kg/m  Unfortunately, HR was not rechecked at 03/14/17 PAT. I spoke with patient, and she will return today for repeat vitals--->BP 132/82, HR 78, O2 sat 100%. Heart RRR, no murmur noted. Lungs clear. She denied shortness of breath, fever, cough and chest pain at PAT appointment.  Multiple medications are listed, but the only ones documented as taking are vitamin D, ibuprofen (holding for surgery).   EKG 12/01/16: NSR at 72 bpm.   Preoperative labs noted. Glucose 151. Cr 0.60. ALK PH 154. AST/ALT 25/17. CBC, PT/PTT WNL. T&S done. UA showed small leukocytes, negative nitrites.  She tolerated right TKA in December. Recheck HR and BP okay today. If no acute changes then I would anticipate that she could proceed as planned.  George Hugh [facility] Short Stay Center/Anesthesiology Phone [phone] 03/15/2017 12:01 PM

## 2017-03-16 ENCOUNTER — Inpatient Hospital Stay (HOSPITAL_COMMUNITY): Payer: Medicaid Other | Admitting: Anesthesiology

## 2017-03-16 ENCOUNTER — Inpatient Hospital Stay (HOSPITAL_COMMUNITY)
Admission: RE | Admit: 2017-03-16 | Discharge: 2017-03-20 | DRG: 470 | Disposition: A | Discharge status: Home Health | Payer: Medicaid Other | Source: Ambulatory Visit | Attending: Orthopaedic Surgery | Admitting: Orthopaedic Surgery

## 2017-03-16 ENCOUNTER — Encounter (HOSPITAL_COMMUNITY): Payer: Self-pay | Admitting: *Deleted

## 2017-03-16 ENCOUNTER — Inpatient Hospital Stay (HOSPITAL_COMMUNITY): Payer: Medicaid Other

## 2017-03-16 ENCOUNTER — Encounter (HOSPITAL_COMMUNITY)
Admission: RE | Disposition: A | Discharge status: Home Health | Payer: Self-pay | Source: Ambulatory Visit | Attending: Orthopaedic Surgery

## 2017-03-16 ENCOUNTER — Inpatient Hospital Stay (HOSPITAL_COMMUNITY): Payer: Medicaid Other | Admitting: Vascular Surgery

## 2017-03-16 DIAGNOSIS — Z91018 Allergy to other foods: Secondary | ICD-10-CM | POA: Diagnosis not present

## 2017-03-16 DIAGNOSIS — M1712 Unilateral primary osteoarthritis, left knee: Secondary | ICD-10-CM | POA: Diagnosis present

## 2017-03-16 DIAGNOSIS — Z7982 Long term (current) use of aspirin: Secondary | ICD-10-CM

## 2017-03-16 DIAGNOSIS — Z888 Allergy status to other drugs, medicaments and biological substances status: Secondary | ICD-10-CM

## 2017-03-16 DIAGNOSIS — Z79899 Other long term (current) drug therapy: Secondary | ICD-10-CM

## 2017-03-16 DIAGNOSIS — I10 Essential (primary) hypertension: Secondary | ICD-10-CM | POA: Diagnosis present

## 2017-03-16 DIAGNOSIS — R51 Headache: Secondary | ICD-10-CM | POA: Diagnosis not present

## 2017-03-16 DIAGNOSIS — D62 Acute posthemorrhagic anemia: Secondary | ICD-10-CM | POA: Diagnosis not present

## 2017-03-16 DIAGNOSIS — Z96651 Presence of right artificial knee joint: Secondary | ICD-10-CM | POA: Diagnosis present

## 2017-03-16 DIAGNOSIS — Z96659 Presence of unspecified artificial knee joint: Secondary | ICD-10-CM

## 2017-03-16 HISTORY — PX: TOTAL KNEE ARTHROPLASTY: SHX125

## 2017-03-16 SURGERY — ARTHROPLASTY, KNEE, TOTAL
Anesthesia: Spinal | Site: Knee | Laterality: Left

## 2017-03-16 MED ORDER — MIDAZOLAM HCL 5 MG/5ML IJ SOLN
INTRAMUSCULAR | Status: DC | PRN
  Administered 2017-03-16: 2 mg via INTRAVENOUS

## 2017-03-16 MED ORDER — OXYCODONE HCL ER 10 MG PO T12A
10.0000 mg | EXTENDED_RELEASE_TABLET | Freq: Two times a day (BID) | ORAL | Status: DC
  Administered 2017-03-16 – 2017-03-20 (×8): 10 mg via ORAL
  Filled 2017-03-16 (×8): qty 1

## 2017-03-16 MED ORDER — METHOCARBAMOL 1000 MG/10ML IJ SOLN
500.0000 mg | Freq: Four times a day (QID) | INTRAVENOUS | Status: DC | PRN
  Filled 2017-03-16: qty 5

## 2017-03-16 MED ORDER — PHENYLEPHRINE HCL 10 MG/ML IJ SOLN
INTRAMUSCULAR | Status: DC | PRN
  Administered 2017-03-16: 80 ug via INTRAVENOUS

## 2017-03-16 MED ORDER — ONDANSETRON HCL 4 MG PO TABS: 4.0000 mg | ORAL_TABLET | Freq: Three times a day (TID) | ORAL | 0 refills | Status: DC | PRN

## 2017-03-16 MED ORDER — ACETAMINOPHEN 500 MG PO TABS
ORAL_TABLET | ORAL | Status: AC
  Filled 2017-03-16: qty 2

## 2017-03-16 MED ORDER — ACETAMINOPHEN 650 MG RE SUPP: 650.0000 mg | Freq: Four times a day (QID) | RECTAL | Status: DC | PRN

## 2017-03-16 MED ORDER — AMLODIPINE BESYLATE 5 MG PO TABS: 5.0000 mg | ORAL_TABLET | Freq: Every day | ORAL | Status: DC

## 2017-03-16 MED ORDER — PHENOL 1.4 % MT LIQD: 1.0000 | OROMUCOSAL | Status: DC | PRN

## 2017-03-16 MED ORDER — OXYCODONE HCL 5 MG PO TABS
5.0000 mg | ORAL_TABLET | ORAL | Status: DC | PRN
  Administered 2017-03-17 – 2017-03-20 (×10): 10 mg via ORAL
  Filled 2017-03-16 (×10): qty 2

## 2017-03-16 MED ORDER — TRANEXAMIC ACID 1000 MG/10ML IV SOLN
1000.0000 mg | Freq: Once | INTRAVENOUS | Status: AC
  Administered 2017-03-16: 1000 mg via INTRAVENOUS
  Filled 2017-03-16: qty 10

## 2017-03-16 MED ORDER — PROMETHAZINE HCL 25 MG PO TABS: 25.0000 mg | ORAL_TABLET | Freq: Four times a day (QID) | ORAL | 1 refills | Status: DC | PRN

## 2017-03-16 MED ORDER — FENTANYL CITRATE (PF) 250 MCG/5ML IJ SOLN
INTRAMUSCULAR | Status: AC
  Filled 2017-03-16: qty 5

## 2017-03-16 MED ORDER — ACETAMINOPHEN 325 MG PO TABS
650.0000 mg | ORAL_TABLET | Freq: Four times a day (QID) | ORAL | Status: DC | PRN
  Administered 2017-03-20: 650 mg via ORAL
  Filled 2017-03-16 (×2): qty 2

## 2017-03-16 MED ORDER — SODIUM CHLORIDE 0.9 % IJ SOLN
INTRAMUSCULAR | Status: DC | PRN
  Administered 2017-03-16: 40 mL

## 2017-03-16 MED ORDER — METOCLOPRAMIDE HCL 5 MG PO TABS: 5.0000 mg | ORAL_TABLET | Freq: Three times a day (TID) | ORAL | Status: DC | PRN

## 2017-03-16 MED ORDER — ASPIRIN EC 325 MG PO TBEC
325.0000 mg | DELAYED_RELEASE_TABLET | Freq: Two times a day (BID) | ORAL | Status: DC
  Administered 2017-03-17 – 2017-03-20 (×7): 325 mg via ORAL
  Filled 2017-03-16 (×7): qty 1

## 2017-03-16 MED ORDER — POLYETHYLENE GLYCOL 3350 17 G PO PACK: 17.0000 g | PACK | Freq: Every day | ORAL | Status: DC | PRN

## 2017-03-16 MED ORDER — OXYCODONE HCL ER 10 MG PO T12A: 10.0000 mg | EXTENDED_RELEASE_TABLET | Freq: Two times a day (BID) | ORAL | 0 refills | Status: DC

## 2017-03-16 MED ORDER — PROPOFOL 500 MG/50ML IV EMUL
INTRAVENOUS | Status: DC | PRN
  Administered 2017-03-16: 10 ug/kg/min via INTRAVENOUS

## 2017-03-16 MED ORDER — ASPIRIN EC 325 MG PO TBEC: 325.0000 mg | DELAYED_RELEASE_TABLET | Freq: Two times a day (BID) | ORAL | 0 refills | Status: DC

## 2017-03-16 MED ORDER — MORPHINE SULFATE (PF) 2 MG/ML IV SOLN
1.0000 mg | INTRAVENOUS | Status: DC | PRN
  Administered 2017-03-17: 1 mg via INTRAVENOUS
  Filled 2017-03-16: qty 1

## 2017-03-16 MED ORDER — CHLORHEXIDINE GLUCONATE 4 % EX LIQD: 60.0000 mL | Freq: Once | CUTANEOUS | Status: DC

## 2017-03-16 MED ORDER — SORBITOL 70 % SOLN: 30.0000 mL | Freq: Every day | Status: DC | PRN

## 2017-03-16 MED ORDER — METHOCARBAMOL 750 MG PO TABS: 750.0000 mg | ORAL_TABLET | Freq: Two times a day (BID) | ORAL | 0 refills | Status: DC | PRN

## 2017-03-16 MED ORDER — FENTANYL CITRATE (PF) 100 MCG/2ML IJ SOLN
INTRAMUSCULAR | Status: DC | PRN
  Administered 2017-03-16: 50 ug via INTRAVENOUS

## 2017-03-16 MED ORDER — POVIDONE-IODINE 10 % EX SOLN: CUTANEOUS | Status: DC | PRN

## 2017-03-16 MED ORDER — VITAMIN D 1000 UNITS PO TABS
1000.0000 [IU] | ORAL_TABLET | Freq: Every day | ORAL | Status: DC
  Administered 2017-03-17 – 2017-03-20 (×4): 1000 [IU] via ORAL
  Filled 2017-03-16 (×5): qty 1

## 2017-03-16 MED ORDER — SENNOSIDES-DOCUSATE SODIUM 8.6-50 MG PO TABS: 1.0000 | ORAL_TABLET | Freq: Every evening | ORAL | 1 refills | Status: DC | PRN

## 2017-03-16 MED ORDER — BUPIVACAINE HCL (PF) 0.5 % IJ SOLN
INTRAMUSCULAR | Status: DC | PRN
  Administered 2017-03-16: 2.5 mL

## 2017-03-16 MED ORDER — OXYCODONE HCL 5 MG PO TABS: 5.0000 mg | ORAL_TABLET | ORAL | 0 refills | Status: DC | PRN

## 2017-03-16 MED ORDER — METHOCARBAMOL 500 MG PO TABS
500.0000 mg | ORAL_TABLET | Freq: Four times a day (QID) | ORAL | Status: DC | PRN
  Administered 2017-03-16 – 2017-03-20 (×5): 500 mg via ORAL
  Filled 2017-03-16 (×4): qty 1

## 2017-03-16 MED ORDER — DEXTROSE 5 % IV SOLN
INTRAVENOUS | Status: DC | PRN
  Administered 2017-03-16: 25 ug/min via INTRAVENOUS

## 2017-03-16 MED ORDER — PROMETHAZINE HCL 25 MG/ML IJ SOLN: 6.2500 mg | INTRAMUSCULAR | Status: DC | PRN

## 2017-03-16 MED ORDER — METOCLOPRAMIDE HCL 5 MG/ML IJ SOLN: 5.0000 mg | Freq: Three times a day (TID) | INTRAMUSCULAR | Status: DC | PRN

## 2017-03-16 MED ORDER — METHOCARBAMOL 500 MG PO TABS
ORAL_TABLET | ORAL | Status: AC
  Filled 2017-03-16: qty 1

## 2017-03-16 MED ORDER — ONDANSETRON HCL 4 MG/2ML IJ SOLN
4.0000 mg | Freq: Four times a day (QID) | INTRAMUSCULAR | Status: DC | PRN
  Administered 2017-03-16: 4 mg via INTRAVENOUS
  Filled 2017-03-16: qty 2

## 2017-03-16 MED ORDER — MAGNESIUM CITRATE PO SOLN: 1.0000 | Freq: Once | ORAL | Status: DC | PRN

## 2017-03-16 MED ORDER — CEFAZOLIN SODIUM-DEXTROSE 2-4 GM/100ML-% IV SOLN
2.0000 g | Freq: Four times a day (QID) | INTRAVENOUS | Status: AC
  Administered 2017-03-16 – 2017-03-17 (×2): 2 g via INTRAVENOUS
  Filled 2017-03-16 (×2): qty 100

## 2017-03-16 MED ORDER — BUPIVACAINE LIPOSOME 1.3 % IJ SUSP
20.0000 mL | INTRAMUSCULAR | Status: AC
  Administered 2017-03-16: 20 mL
  Filled 2017-03-16: qty 20

## 2017-03-16 MED ORDER — SODIUM CHLORIDE 0.9 % IR SOLN
Status: DC | PRN
  Administered 2017-03-16: 3000 mL

## 2017-03-16 MED ORDER — KETOROLAC TROMETHAMINE 30 MG/ML IJ SOLN
INTRAMUSCULAR | Status: AC
  Administered 2017-03-16: 30 mg
  Filled 2017-03-16: qty 1

## 2017-03-16 MED ORDER — 0.9 % SODIUM CHLORIDE (POUR BTL) OPTIME
TOPICAL | Status: DC | PRN
  Administered 2017-03-16: 1000 mL

## 2017-03-16 MED ORDER — MIDAZOLAM HCL 2 MG/2ML IJ SOLN
INTRAMUSCULAR | Status: AC
  Filled 2017-03-16: qty 2

## 2017-03-16 MED ORDER — PROPOFOL 10 MG/ML IV BOLUS
INTRAVENOUS | Status: DC | PRN
  Administered 2017-03-16: 30 mg via INTRAVENOUS

## 2017-03-16 MED ORDER — ALUM & MAG HYDROXIDE-SIMETH 200-200-20 MG/5ML PO SUSP: 30.0000 mL | ORAL | Status: DC | PRN

## 2017-03-16 MED ORDER — KETOROLAC TROMETHAMINE 15 MG/ML IJ SOLN
30.0000 mg | Freq: Four times a day (QID) | INTRAMUSCULAR | Status: AC
  Administered 2017-03-16 – 2017-03-17 (×4): 30 mg via INTRAVENOUS
  Filled 2017-03-16 (×3): qty 2

## 2017-03-16 MED ORDER — TRANEXAMIC ACID 1000 MG/10ML IV SOLN
2000.0000 mg | INTRAVENOUS | Status: AC
  Administered 2017-03-16: 2000 mg via TOPICAL
  Filled 2017-03-16: qty 20

## 2017-03-16 MED ORDER — DEXAMETHASONE SODIUM PHOSPHATE 10 MG/ML IJ SOLN
10.0000 mg | Freq: Once | INTRAMUSCULAR | Status: AC
  Administered 2017-03-17: 10 mg via INTRAVENOUS
  Filled 2017-03-16: qty 1

## 2017-03-16 MED ORDER — PROPOFOL 10 MG/ML IV BOLUS
INTRAVENOUS | Status: AC
  Filled 2017-03-16: qty 20

## 2017-03-16 MED ORDER — ONDANSETRON HCL 4 MG PO TABS: 4.0000 mg | ORAL_TABLET | Freq: Four times a day (QID) | ORAL | Status: DC | PRN

## 2017-03-16 MED ORDER — MIDAZOLAM HCL 2 MG/2ML IJ SOLN
INTRAMUSCULAR | Status: AC
  Administered 2017-03-16: 1 mg
  Filled 2017-03-16: qty 2

## 2017-03-16 MED ORDER — LACTATED RINGERS IV SOLN
INTRAVENOUS | Status: DC
  Administered 2017-03-16 (×3): via INTRAVENOUS

## 2017-03-16 MED ORDER — MENTHOL 3 MG MT LOZG: 1.0000 | LOZENGE | OROMUCOSAL | Status: DC | PRN

## 2017-03-16 MED ORDER — ACETAMINOPHEN 500 MG PO TABS
1000.0000 mg | ORAL_TABLET | Freq: Four times a day (QID) | ORAL | Status: AC
  Administered 2017-03-16 – 2017-03-17 (×4): 1000 mg via ORAL
  Filled 2017-03-16 (×2): qty 2

## 2017-03-16 MED ORDER — HYDROMORPHONE HCL 1 MG/ML IJ SOLN: 0.2500 mg | INTRAMUSCULAR | Status: DC | PRN

## 2017-03-16 MED ORDER — FENTANYL CITRATE (PF) 100 MCG/2ML IJ SOLN
INTRAMUSCULAR | Status: AC
  Administered 2017-03-16: 50 ug
  Filled 2017-03-16: qty 2

## 2017-03-16 MED ORDER — DIPHENHYDRAMINE HCL 12.5 MG/5ML PO ELIX: 25.0000 mg | ORAL_SOLUTION | ORAL | Status: DC | PRN

## 2017-03-16 MED ORDER — SODIUM CHLORIDE 0.9 % IV SOLN
INTRAVENOUS | Status: DC
  Administered 2017-03-16: 19:00:00 via INTRAVENOUS

## 2017-03-16 SURGICAL SUPPLY — 75 items
ALCOHOL ISOPROPYL (RUBBING) (MISCELLANEOUS) ×3 IMPLANT
APL SKNCLS STERI-STRIP NONHPOA (GAUZE/BANDAGES/DRESSINGS)
BAG DECANTER FOR FLEXI CONT (MISCELLANEOUS) ×3 IMPLANT
BANDAGE ACE 6X5 VEL STRL LF (GAUZE/BANDAGES/DRESSINGS) ×2 IMPLANT
BANDAGE ESMARK 6X9 LF (GAUZE/BANDAGES/DRESSINGS) ×1 IMPLANT
BENZOIN TINCTURE PRP APPL 2/3 (GAUZE/BANDAGES/DRESSINGS) ×1 IMPLANT
BLADE SAW SGTL 13.0X1.19X90.0M (BLADE) ×3 IMPLANT
BNDG CMPR 9X6 STRL LF SNTH (GAUZE/BANDAGES/DRESSINGS) ×1
BNDG CMPR MED 10X6 ELC LF (GAUZE/BANDAGES/DRESSINGS) ×1
BNDG ELASTIC 6X10 VLCR STRL LF (GAUZE/BANDAGES/DRESSINGS) ×2 IMPLANT
BNDG ESMARK 6X9 LF (GAUZE/BANDAGES/DRESSINGS) ×3
BOWL SMART MIX CTS (DISPOSABLE) ×3 IMPLANT
CAPT KNEE TRIATH TK-4 ×2 IMPLANT
CLOSURE STERI-STRIP 1/2X4 (GAUZE/BANDAGES/DRESSINGS) ×2
CLSR STERI-STRIP ANTIMIC 1/2X4 (GAUZE/BANDAGES/DRESSINGS) ×4 IMPLANT
COVER SURGICAL LIGHT HANDLE (MISCELLANEOUS) ×3 IMPLANT
CUFF TOURNIQUET SINGLE 34IN LL (TOURNIQUET CUFF) ×3 IMPLANT
CUFF TOURNIQUET SINGLE 44IN (TOURNIQUET CUFF) IMPLANT
DRAPE EXTREMITY T 121X128X90 (DRAPE) ×3 IMPLANT
DRAPE HALF SHEET 40X57 (DRAPES) ×3 IMPLANT
DRAPE INCISE IOBAN 66X45 STRL (DRAPES) ×2 IMPLANT
DRAPE ORTHO SPLIT 77X108 STRL (DRAPES) ×6
DRAPE SURG 17X11 SM STRL (DRAPES) ×6 IMPLANT
DRAPE SURG ORHT 6 SPLT 77X108 (DRAPES) ×2 IMPLANT
DRSG AQUACEL AG ADV 3.5X14 (GAUZE/BANDAGES/DRESSINGS) ×3 IMPLANT
DURAPREP 26ML APPLICATOR (WOUND CARE) ×6 IMPLANT
ELECT CAUTERY BLADE 6.4 (BLADE) ×3 IMPLANT
ELECT REM PT RETURN 9FT ADLT (ELECTROSURGICAL) ×3
ELECTRODE REM PT RTRN 9FT ADLT (ELECTROSURGICAL) ×1 IMPLANT
GLOVE BIOGEL PI IND STRL 6.5 (GLOVE) IMPLANT
GLOVE BIOGEL PI INDICATOR 6.5 (GLOVE) ×2
GLOVE SKINSENSE NS SZ7.5 (GLOVE) ×2
GLOVE SKINSENSE STRL SZ7.5 (GLOVE) ×1 IMPLANT
GLOVE SURG SS PI 6.0 STRL IVOR (GLOVE) ×2 IMPLANT
GLOVE SURG SYN 7.5  E (GLOVE) ×8
GLOVE SURG SYN 7.5 E (GLOVE) ×4 IMPLANT
GLOVE SURG SYN 7.5 PF PI (GLOVE) ×4 IMPLANT
GOWN STRL REIN XL XLG (GOWN DISPOSABLE) ×3 IMPLANT
GOWN STRL REUS W/ TWL LRG LVL3 (GOWN DISPOSABLE) ×1 IMPLANT
GOWN STRL REUS W/TWL LRG LVL3 (GOWN DISPOSABLE) ×6
HANDPIECE INTERPULSE COAX TIP (DISPOSABLE) ×3
HOOD PEEL AWAY FLYTE STAYCOOL (MISCELLANEOUS) ×10 IMPLANT
KIT BASIN OR (CUSTOM PROCEDURE TRAY) ×3 IMPLANT
KIT ROOM TURNOVER OR (KITS) ×3 IMPLANT
MANIFOLD NEPTUNE II (INSTRUMENTS) ×3 IMPLANT
MARKER SKIN DUAL TIP RULER LAB (MISCELLANEOUS) ×3 IMPLANT
NDL FILTER BLUNT 18X1 1/2 (NEEDLE) IMPLANT
NDL SPNL 18GX3.5 QUINCKE PK (NEEDLE) ×1 IMPLANT
NEEDLE FILTER BLUNT 18X 1/2SAF (NEEDLE) ×2
NEEDLE FILTER BLUNT 18X1 1/2 (NEEDLE) ×1 IMPLANT
NEEDLE SPNL 18GX3.5 QUINCKE PK (NEEDLE) ×3 IMPLANT
NS IRRIG 1000ML POUR BTL (IV SOLUTION) ×3 IMPLANT
PACK TOTAL JOINT (CUSTOM PROCEDURE TRAY) ×3 IMPLANT
PAD ARMBOARD 7.5X6 YLW CONV (MISCELLANEOUS) ×6 IMPLANT
PADDING CAST COTTON 6X4 STRL (CAST SUPPLIES) ×2 IMPLANT
SAW OSC TIP CART 19.5X105X1.3 (SAW) ×3 IMPLANT
SET HNDPC FAN SPRY TIP SCT (DISPOSABLE) ×1 IMPLANT
STAPLER VISISTAT 35W (STAPLE) IMPLANT
SUCTION FRAZIER HANDLE 10FR (MISCELLANEOUS) ×2
SUCTION TUBE FRAZIER 10FR DISP (MISCELLANEOUS) ×1 IMPLANT
SUT ETHILON 2 0 FS 18 (SUTURE) IMPLANT
SUT MNCRL AB 4-0 PS2 18 (SUTURE) ×2 IMPLANT
SUT VIC AB 0 CT1 27 (SUTURE) ×6
SUT VIC AB 0 CT1 27XBRD ANBCTR (SUTURE) ×2 IMPLANT
SUT VIC AB 1 CTX 27 (SUTURE) ×9 IMPLANT
SUT VIC AB 2-0 CT1 27 (SUTURE) ×9
SUT VIC AB 2-0 CT1 TAPERPNT 27 (SUTURE) ×3 IMPLANT
SYR 50ML LL SCALE MARK (SYRINGE) ×3 IMPLANT
SYRINGE 20CC LL (MISCELLANEOUS) ×2 IMPLANT
TOWEL OR 17X24 6PK STRL BLUE (TOWEL DISPOSABLE) ×3 IMPLANT
TOWEL OR 17X26 10 PK STRL BLUE (TOWEL DISPOSABLE) ×3 IMPLANT
TRAY CATH 16FR W/PLASTIC CATH (SET/KITS/TRAYS/PACK) IMPLANT
TRAY FOLEY CATH SILVER 16FR (SET/KITS/TRAYS/PACK) ×2 IMPLANT
UNDERPAD 30X30 (UNDERPADS AND DIAPERS) ×3 IMPLANT
WRAP KNEE MAXI GEL POST OP (GAUZE/BANDAGES/DRESSINGS) ×3 IMPLANT

## 2017-03-16 NOTE — Anesthesia Procedure Notes (Signed)
Spinal  Patient location during procedure: OR Start time: 03/16/2017 2:02 PM End time: 03/16/2017 2:09 PM Staffing Anesthesiologist: Javonte Elenes, Iona Beard Performed: anesthesiologist  Preanesthetic Checklist Completed: patient identified, site marked, surgical consent, pre-op evaluation, timeout performed, IV checked, risks and benefits discussed and monitors and equipment checked Spinal Block Patient position: sitting Prep: Betadine Patient monitoring: heart rate, continuous pulse ox and blood pressure Location: L4-5 Injection technique: single-shot Needle Needle type: Sprotte  Needle gauge: 24 G Needle length: 9 cm Additional Notes Expiration date of kit checked and confirmed. Patient tolerated procedure well, without complications.

## 2017-03-16 NOTE — Op Note (Signed)
Total Knee Arthroplasty Procedure Note  Preoperative diagnosis: Left knee osteoarthritis  Postoperative diagnosis:same  Operative procedure: Left total knee arthroplasty. CPT [phone]  Surgeon: N. Glee Arvin, MD  Assistants: Patrick Jupiter, RNFA  Anesthesia: Spinal, regional  Tourniquet time: 75 mins  Implants used: Stryker Uncemented Femur: Triatholon PS 3 Tibia: 4 Patella: 29 mm, 9 thick Polyethylene: 9 mm  Indication: Catherine Patrick is a 51 y.o. year old female with a history of knee pain. Having failed conservative management, the patient elected to proceed with a total knee arthroplasty.  We have reviewed the risk and benefits of the surgery and they elected to proceed after voicing understanding.  Procedure:  After informed consent was obtained and understanding of the risk were voiced including but not limited to bleeding, infection, damage to surrounding structures including nerves and vessels, blood clots, leg length inequality and the failure to achieve desired results, the operative extremity was marked with verbal confirmation of the patient in the holding area.   The patient was then brought to the operating room and transported to the operating room table in the supine position.  A tourniquet was applied to the operative extremity around the upper thigh. The operative limb was then prepped and draped in the usual sterile fashion and preoperative antibiotics were administered.  A time out was performed prior to the start of surgery confirming the correct extremity, preoperative antibiotic administration, as well as team members, implants and instruments available for the case. Correct surgical site was also confirmed with preoperative radiographs. The limb was then elevated for exsanguination and the tourniquet was inflated. A midline incision was made and a standard medial parapatellar approach was performed.  The patella was prepared and sized to a 29 mm.  A cover was placed  on the patella for protection from retractors.  We then turned our attention to the femur. Posterior cruciate ligament was sacrificed. Start site was drilled in the femur and the intramedullary distal femoral cutting guide was placed, set at 5 degrees valgus, taking 9 mm of distal resection. The distal cut was made. Osteophytes were then removed. Next, the proximal tibial cutting guide was placed with appropriate slope, varus/valgus alignment and depth of resection. The proximal tibial cut was made. Gap blocks were then used to assess the extension gap and alignment, and appropriate soft tissue releases were performed. Attention was turned back to the femur, which was sized using the sizing guide to a size 3. Appropriate rotation of the femoral component was determined using epicondylar axis, Whiteside's line, and assessing the flexion gap under ligament tension. The appropriate size 4-in-1 cutting block was placed and cuts were made. Posterior femoral osteophytes and uncapped bone were then removed with the curved osteotome. The tibia was sized for a size 4 component. The femoral box-cutting guide was placed and prepared for a PS femoral component. Trial components were placed, and stability was checked in full extension, mid-flexion, and deep flexion. Proper tibial rotation was determined and marked.  The patella tracked well without a lateral release. Trial components were then removed and tibial preparation performed. A posterior capsular injection comprising of 20 cc of 1.3% exparel and 40 cc of normal saline was performed for postoperative pain control. The bony surfaces were irrigated with a pulse lavage and then dried. The final components sized above were malleted into place. The stability of the construct was re-evaluated throughout a range of motion and found to be acceptable. The trial liner was removed, the knee was copiously  irrigated, and the knee was re-evaluated for any excess bone debris. The  real polyethylene liner, 9 mm thick, was inserted and checked to ensure the locking mechanism had engaged appropriately. The tourniquet was deflated and hemostasis was achieved. The wound was irrigated with normal saline. A drain was not placed. Capsular closure was performed with a #1 vicryl, subcutaneous fat closed with a 0 vicryl suture, then subcutaneous tissue closed with interrupted 2.0 vicryl suture. The skin was then closed with 3.0 monocryl. A sterile dressing was applied.   The patient was awakened in the operating room and taken to recovery in stable condition. All sponge, needle, and instrument counts were correct at the end of the case.  Position: supine  Complications: none.  Time Out: performed   Drains/Packing: none  Estimated blood loss: minimal  Returned to Recovery Room: in good condition.   Antibiotics: yes   Mechanical VTE (DVT) Prophylaxis: sequential compression devices, TED thigh-high  Chemical VTE (DVT) Prophylaxis: aspirin  Fluid Replacement  Crystalloid: see anesthesia record Blood: none  FFP: none   Specimens Removed: 1 to pathology   Sponge and Instrument Count Correct? yes   PACU: portable radiograph - knee AP and Lateral   Admission: inpatient status  Plan/RTC: Return in 2 weeks for wound check.   Weight Bearing/Load Lower Extremity: full   N. Glee Arvin, MD [facility] [phone] 4:08 PM

## 2017-03-16 NOTE — Anesthesia Postprocedure Evaluation (Signed)
Anesthesia Post Note  Patient: Catherine Patrick  Procedure(s) Performed: Procedure(s) (LRB): LEFT TOTAL KNEE ARTHROPLASTY (Left)  Patient location during evaluation: PACU Anesthesia Type: Spinal Level of consciousness: oriented and awake and alert Pain management: pain level controlled Vital Signs Assessment: post-procedure vital signs reviewed and stable Respiratory status: spontaneous breathing, respiratory function stable and patient connected to nasal cannula oxygen Cardiovascular status: blood pressure returned to baseline and stable Postop Assessment: no headache and no backache Anesthetic complications: no       Last Vitals:  Vitals:   03/16/17 1800 03/16/17 1815  BP: (!) 145/100   Pulse: 79 72  Resp: 16 13  Temp:      Last Pain:  Vitals:   03/16/17 1815  TempSrc:   PainSc: Asleep                 Britiny Defrain S

## 2017-03-16 NOTE — Anesthesia Preprocedure Evaluation (Signed)
Anesthesia Evaluation  Patient identified by MRN, date of birth, ID band Patient awake    Reviewed: Allergy & Precautions, NPO status , Patient's Chart, lab work & pertinent test results  Airway Mallampati: II  TM Distance: >3 FB Neck ROM: Full    Dental no notable dental hx.    Pulmonary neg pulmonary ROS,    Pulmonary exam normal breath sounds clear to auscultation       Cardiovascular hypertension, Normal cardiovascular exam Rhythm:Regular Rate:Normal     Neuro/Psych negative neurological ROS  negative psych ROS   GI/Hepatic negative GI ROS, Neg liver ROS,   Endo/Other  negative endocrine ROS  Renal/GU negative Renal ROS  negative genitourinary   Musculoskeletal negative musculoskeletal ROS (+)   Abdominal   Peds negative pediatric ROS (+)  Hematology negative hematology ROS (+)   Anesthesia Other Findings   Reproductive/Obstetrics negative OB ROS                             Anesthesia Physical Anesthesia Plan  ASA: II  Anesthesia Plan: Spinal   Post-op Pain Management:    Induction: Intravenous  Airway Management Planned: Simple Face Mask  Additional Equipment:   Intra-op Plan:   Post-operative Plan:   Informed Consent: I have reviewed the patients History and Physical, chart, labs and discussed the procedure including the risks, benefits and alternatives for the proposed anesthesia with the patient or authorized representative who has indicated his/her understanding and acceptance.   Dental advisory given  Plan Discussed with: CRNA and Surgeon  Anesthesia Plan Comments:         Anesthesia Quick Evaluation  

## 2017-03-16 NOTE — Progress Notes (Signed)
Orthopedic Tech Progress Note Patient Details:  Catherine Patrick March 30, 1966 [id number]  CPM Left Knee CPM Left Knee: On Left Knee Flexion (Degrees): 90 Left Knee Extension (Degrees): 0   [name] 03/16/2017, 5:41 PM

## 2017-03-16 NOTE — Progress Notes (Signed)
Report to Maria Talamayan RN as primary caregiver 

## 2017-03-16 NOTE — Transfer of Care (Signed)
Immediate Anesthesia Transfer of Care Note  Patient: Catherine Patrick  Procedure(s) Performed: Procedure(s): LEFT TOTAL KNEE ARTHROPLASTY (Left)  Patient Location: PACU  Anesthesia Type:Spinal  Level of Consciousness: drowsy  Airway & Oxygen Therapy: Patient Spontanous Breathing  Post-op Assessment: Report given to RN and Post -op Vital signs reviewed and stable  Post vital signs: Reviewed and stable  Last Vitals:  Vitals:   03/16/17 1146 03/16/17 1642  BP: 134/86   Pulse: 81   Resp: 20   Temp: 36.6 C 36.1 C    Last Pain:  Vitals:   03/16/17 1209  TempSrc:   PainSc: 8       Patients Stated Pain Goal: 4 (03/16/17 1209)  Complications: No apparent anesthesia complications

## 2017-03-16 NOTE — Discharge Instructions (Signed)

## 2017-03-16 NOTE — H&P (Signed)
PREOPERATIVE H&P  Chief Complaint: left knee degenerative joint disease  HPI: Catherine Patrick is a 51 y.o. female who presents for surgical treatment of left knee degenerative joint disease.  She denies any changes in medical history.  Past Medical History:  Diagnosis Date  . DJD (degenerative joint disease)   . Headache   . History of blood transfusion    2 times after child birth  . History of kidney stones    passed one in 2014- 2017  . Hypertension   . Kidney stones    Past Surgical History:  Procedure Laterality Date  . CESAREAN SECTION    . CESAREAN SECTION    . HERNIA REPAIR    . LIPOSUCTION  2002  . TOTAL KNEE ARTHROPLASTY Right 11/30/2016   Procedure: RIGHT TOTAL KNEE ARTHROPLASTY;  Surgeon: Tarry Kos, MD;  Location: [facility] OR;  Service: Orthopedics;  Laterality: Right;   Social History   Social History  . Marital status: Single    Spouse name: N/A  . Number of children: N/A  . Years of education: N/A   Social History Main Topics  . Smoking status: Never Smoker  . Smokeless tobacco: Never Used  . Alcohol use No  . Drug use: No  . Sexual activity: Yes    Birth control/ protection: None   Other Topics Concern  . Not on file   Social History Narrative  . No narrative on file   Family History  Problem Relation Age of Onset  . Cancer Neg Hx   . Diabetes Neg Hx   . Heart failure Neg Hx   . Hyperlipidemia Neg Hx   . Hypertension Neg Hx    Allergies  Allergen Reactions  . Amlodipine Palpitations    WITH FIRST DOSES BEGAN AFTER EACH DOSE RESOLVED WHEN D/C'd   . Pork-Derived Products     PATIENT PREFERENCE. CULTURAL DICTATES   Prior to Admission medications   Medication Sig Start Date End Date Taking? Authorizing Provider  cholecalciferol (VITAMIN D) 1000 units tablet Take 1,000 Units by mouth daily.   Yes Historical Provider, MD  ibuprofen (ADVIL,MOTRIN) 800 MG tablet Take 1 tablet (800 mg total) by mouth every 8 (eight) hours as needed. Patient  taking differently: Take 800 mg by mouth 2 (two) times daily as needed for headache or moderate pain.  12/12/16  Yes Rily Nickey Donnelly Stager, MD  acetaminophen (TYLENOL) 500 MG tablet Take 2 tablets (1,000 mg total) by mouth every 6 (six) hours as needed. Patient not taking: Reported on 03/09/2017 12/12/16   Tarry Kos, MD  acetaminophen-codeine (TYLENOL #3) 300-30 MG tablet Take 1-2 tablets by mouth at bedtime as needed for moderate pain. Patient not taking: Reported on 03/09/2017 10/26/16   Vivianne Master, PA-C  amLODipine (NORVASC) 5 MG tablet Take 1 tablet (5 mg total) by mouth daily. Patient not taking: Reported on 12/12/2016 11/18/16   Dessa Phi, MD  aspirin EC 325 MG tablet Take 1 tablet (325 mg total) by mouth 2 (two) times daily. Patient not taking: Reported on 03/09/2017 11/30/16   Tarry Kos, MD  Glucosamine-Chondroitin 750-600 MG TABS Take 2 tablets by mouth daily. Patient not taking: Reported on 03/09/2017 11/18/16   Dessa Phi, MD  HYDROcodone-acetaminophen (NORCO) 7.5-325 MG tablet 1 tab po bid prn pain Patient not taking: Reported on 02/07/2017 12/30/16   Tarry Kos, MD  ibuprofen (ADVIL,MOTRIN) 600 MG tablet Take 1 tablet (600 mg total) by mouth every 8 (eight) hours as  needed. Patient not taking: Reported on 12/12/2016 11/18/16   Dessa Phi, MD  methocarbamol (ROBAXIN) 500 MG tablet Take 1 tablet (500 mg total) by mouth every 6 (six) hours as needed for muscle spasms. Patient not taking: Reported on 02/07/2017 12/12/16   Tarry Kos, MD  methocarbamol (ROBAXIN) 750 MG tablet Take 1 tablet (750 mg total) by mouth 2 (two) times daily as needed for muscle spasms. Patient not taking: Reported on 02/07/2017 11/30/16   Tarry Kos, MD  ondansetron (ZOFRAN) 4 MG tablet Take 1-2 tablets (4-8 mg total) by mouth every 8 (eight) hours as needed for nausea or vomiting. Patient not taking: Reported on 03/09/2017 11/30/16   Tarry Kos, MD  oxyCODONE (OXY IR/ROXICODONE) 5 MG immediate release tablet Take  1-3 tablets (5-15 mg total) by mouth every 4 (four) hours as needed. Patient not taking: Reported on 02/07/2017 11/30/16   Tarry Kos, MD  oxyCODONE (OXY IR/ROXICODONE) 5 MG immediate release tablet Take 1-2 tablets (5-10 mg total) by mouth every 6 (six) hours as needed. Patient not taking: Reported on 02/07/2017 12/12/16   Tarry Kos, MD  oxyCODONE (OXYCONTIN) 10 mg 12 hr tablet Take 1 tablet (10 mg total) by mouth every 12 (twelve) hours. Patient not taking: Reported on 02/07/2017 11/30/16   Tarry Kos, MD  promethazine (PHENERGAN) 25 MG tablet Take 1 tablet (25 mg total) by mouth every 6 (six) hours as needed for nausea. Patient not taking: Reported on 12/12/2016 11/30/16   Tarry Kos, MD  senna-docusate (SENOKOT S) 8.6-50 MG tablet Take 1 tablet by mouth at bedtime as needed. Patient not taking: Reported on 03/09/2017 11/30/16   Tarry Kos, MD  Turmeric 500 MG CAPS Take 500 mg by mouth daily. Patient not taking: Reported on 12/12/2016 11/18/16   Dessa Phi, MD     Positive ROS: All other systems have been reviewed and were otherwise negative with the exception of those mentioned in the HPI and as above.  Physical Exam: General: Alert, no acute distress Cardiovascular: No pedal edema Respiratory: No cyanosis, no use of accessory musculature GI: abdomen soft Skin: No lesions in the area of chief complaint Neurologic: Sensation intact distally Psychiatric: Patient is competent for consent with normal mood and affect Lymphatic: no lymphedema  MUSCULOSKELETAL: exam stable  Assessment: left knee degenerative joint disease  Plan: Plan for Procedure(s): LEFT TOTAL KNEE ARTHROPLASTY  The risks benefits and alternatives were discussed with the patient including but not limited to the risks of nonoperative treatment, versus surgical intervention including infection, bleeding, nerve injury,  blood clots, cardiopulmonary complications, morbidity, mortality, among others, and they were  willing to proceed.   Glee Arvin, MD   03/16/2017 11:32 AM

## 2017-03-16 NOTE — Anesthesia Procedure Notes (Signed)
Procedure Name: MAC Date/Time: 03/16/2017 2:00 PM Performed by: Myrtie Soman Pre-anesthesia Checklist: Patient identified, Emergency Drugs available, Suction available, Patient being monitored and Timeout performed Oxygen Delivery Method: Simple face mask Placement Confirmation: positive ETCO2

## 2017-03-17 ENCOUNTER — Other Ambulatory Visit (INDEPENDENT_AMBULATORY_CARE_PROVIDER_SITE_OTHER): Payer: Self-pay | Admitting: Orthopaedic Surgery

## 2017-03-17 ENCOUNTER — Encounter (HOSPITAL_COMMUNITY): Payer: Self-pay | Admitting: Orthopaedic Surgery

## 2017-03-17 LAB — BASIC METABOLIC PANEL
ANION GAP: 7 (ref 5–15)
BUN: 10 mg/dL (ref 6–20)
CALCIUM: 8.1 mg/dL — AB (ref 8.9–10.3)
CO2: 27 mmol/L (ref 22–32)
Chloride: 107 mmol/L (ref 101–111)
Creatinine, Ser: 0.59 mg/dL (ref 0.44–1.00)
GFR calc Af Amer: >60 mL/min (ref >60)
GLUCOSE: 135 mg/dL — AB (ref 65–99)
Potassium: 3.6 mmol/L (ref 3.5–5.1)
SODIUM: 141 mmol/L (ref 135–145)

## 2017-03-17 LAB — CBC
HCT: 34.4 % — ABNORMAL LOW (ref 36.0–46.0)
Hemoglobin: 11.1 g/dL — ABNORMAL LOW (ref 12.0–15.0)
MCH: 28.7 pg (ref 26.0–34.0)
MCHC: 32.3 g/dL (ref 30.0–36.0)
MCV: 88.9 fL (ref 78.0–100.0)
PLATELETS: 183 10*3/uL (ref 150–400)
RBC: 3.87 MIL/uL (ref 3.87–5.11)
RDW: 14.6 % (ref 11.5–15.5)
WBC: 6.8 10*3/uL (ref 4.0–10.5)

## 2017-03-17 NOTE — Progress Notes (Signed)
   Subjective:  Patient reports pain as severe.  More painful than last TKA. Also c/o headache and left arm pain  Objective:   VITALS:   Vitals:   03/16/17 1945 03/16/17 2015 03/17/17 0002 03/17/17 0446  BP:  125/78 (!) 107/55 (!) 102/57  Pulse: 87 81 83 78  Resp: Temp: 97 F (36.1 C) 97.5 F (36.4 C) 97.9 F (36.6 C) 98.1 F (36.7 C)  TempSrc:  Oral Oral Oral  SpO2: 99% 99% 99% 98%  Weight:      Height:        Neurologically intact ABD soft Neurovascular intact Sensation intact distally Intact pulses distally Dorsiflexion/Plantar flexion intact Incision: dressing C/D/I and no drainage No cellulitis present Compartment soft   Lab Results  Component Value Date   WBC 6.8 03/17/2017   HGB 11.1 (L) 03/17/2017   HCT 34.4 (L) 03/17/2017   MCV 88.9 03/17/2017   PLT 183 03/17/2017     Assessment/Plan:  1 Day Post-Op   - Expected postop acute blood loss anemia - will monitor for symptoms - Up with PT/OT - DVT ppx - SCDs, ambulation, aspirin - WBAT operative extremity - Pain control: toradol 30 mg q6h x 4 doses, prn opiates - likely spinal headache and positioning of left arm - will continue to monitor  Glee Arvin 03/17/2017, 7:44 AM [phone]

## 2017-03-17 NOTE — Progress Notes (Signed)
PT Cancellation Note  Patient Details Name: Taleah Bellantoni MRN: [id number] DOB: 1966-04-25   Cancelled Treatment:    Reason Eval/Treat Not Completed: Patient declined, no reason specified. Pt had a visitor coming into room at 1:53 P and requesting that PT hold off until the visitor left.    Colin Broach PT, DPT  [phone]  03/17/2017, 1.53P

## 2017-03-17 NOTE — Progress Notes (Signed)
Occupational Therapy Evaluation Patient Details Name: Catherine Patrick MRN: [id number] DOB: Feb 03, 1966 Today's Date: 03/17/2017    History of Present Illness Pt is a 51 yo female admitted for L TKA. PMH significant for R TKA 12/17, H/A, HTN, Kideny stones.    Clinical Impression   PTA, pt independent with ADL and mobility. Recent R TKA 12/17. Pt will be alone during the day while her son is at school and will benefit from acute OT to maximize functional level of independence. Will further assess need for AE for LB ADL and educate on safe tub transfer techniques. Evaluation limited by pain this pm.     Follow Up Recommendations  No OT follow up;Supervision - Intermittent    Equipment Recommendations  None recommended by OT    Recommendations for Other Services       Precautions / Restrictions Precautions Precautions: Knee Precaution Booklet Issued: Yes (comment) Precaution Comments: Handout given and reviewed no pillow or towel under knee Restrictions Weight Bearing Restrictions: Yes LLE Weight Bearing: Weight bearing as tolerated      Mobility Bed Mobility Overal bed mobility: Needs Assistance Bed Mobility: Supine to Sit;Sit to Supine     Supine to sit: Min assist Sit to supine: Min assist   General bed mobility comments: difficulty moving LLE  Transfers Overall transfer level: Needs assistance Equipment used: Rolling walker (2 wheeled) Transfers: Sit to/from Stand Sit to Stand: Min guard         General transfer comment: Min gaurd for safety from commode and EOB    Balance Overall balance assessment: Needs assistance           Standing balance-Leahy Scale: Fair                             ADL either performed or assessed with clinical judgement   ADL Overall ADL's : Needs assistance/impaired     Grooming: Set up   Upper Body Bathing: Set up   Lower Body Bathing: Minimal assistance   Upper Body Dressing : Set up   Lower Body Dressing:  Minimal assistance               Functional mobility during ADLs: Min guard;Rolling walker General ADL Comments: May benefit from AE     Vision         Perception     Praxis      Pertinent Vitals/Pain Pain Assessment: 0-10 Pain Score: 10-Worst pain ever Pain Location: left Knee  Pain Descriptors / Indicators: Aching;Discomfort;Grimacing Pain Intervention(s): Limited activity within patient's tolerance;RN gave pain meds during session;Ice applied     Hand Dominance Right   Extremity/Trunk Assessment Upper Extremity Assessment Upper Extremity Assessment: Overall WFL for tasks assessed   Lower Extremity Assessment Lower Extremity Assessment: Defer to PT evaluation   Cervical / Trunk Assessment Cervical / Trunk Assessment: Normal   Communication Communication Communication: No difficulties   Cognition Arousal/Alertness: Awake/alert Behavior During Therapy: WFL for tasks assessed/performed Overall Cognitive Status: Within Functional Limits for tasks assessed                                     General Comments       Exercises     Shoulder Instructions      Home Living Family/patient expects to be discharged to:: Private residence Living Arrangements: Children Available Help at Discharge: Family;Available 24  hours/day Type of Home: Other(Comment) Home Access: Stairs to enter Entrance Stairs-Number of Steps: 2 Entrance Stairs-Rails: None Home Layout: Two level;Bed/bath upstairs Alternate Level Stairs-Number of Steps: flight Alternate Level Stairs-Rails: Right Bathroom Shower/Tub: Chief Strategy Officer: Standard Bathroom Accessibility: Yes How Accessible: Accessible via walker Home Equipment: Walker - 2 wheels;Bedside commode;Cane - single point   Additional Comments: States she uses a "table" when showering? Son assists after he returns home from school.      Prior Functioning/Environment Level of Independence:  Independent with assistive device(s)        Comments: using cane for mobility        OT Problem List: Decreased strength;Decreased range of motion;Decreased knowledge of use of DME or AE;Decreased knowledge of precautions;Obesity;Pain      OT Treatment/Interventions: Self-care/ADL training;DME and/or AE instruction;Therapeutic activities;Patient/family education    OT Goals(Current goals can be found in the care plan section) Acute Rehab OT Goals Patient Stated Goal: to have less pain OT Goal Formulation: With patient Time For Goal Achievement: 03/31/17 Potential to Achieve Goals: Good ADL Goals Pt Will Perform Lower Body Bathing: with set-up;with supervision;sit to/from stand;with adaptive equipment Pt Will Perform Lower Body Dressing: with set-up;with supervision;with adaptive equipment;sit to/from stand Pt Will Transfer to Toilet: with modified independence;ambulating;bedside commode Pt Will Perform Toileting - Clothing Manipulation and hygiene: with modified independence;sit to/from stand Pt Will Perform Tub/Shower Transfer: with caregiver independent in assisting;3 in 1;Tub transfer;rolling walker;with min assist  OT Frequency: Min 2X/week   Barriers to D/C:            Co-evaluation              End of Session Equipment Utilized During Treatment: Rolling walker CPM Left Knee CPM Left Knee: Off Nurse Communication: Mobility status  Activity Tolerance: Patient limited by pain Patient left: in bed;with call bell/phone within reach  OT Visit Diagnosis: Other abnormalities of gait and mobility (R26.89);Pain Pain - Right/Left: Left Pain - part of body: Knee                Time: 4098-1191 OT Time Calculation (min): 12 min Charges:  OT General Charges $OT Visit: 1 Procedure OT Evaluation $OT Eval Low Complexity: 1 Procedure G-Codes:     Willim Turnage, OT/L  [phone] 03/17/2017  Adine Heimann,HILLARY 03/17/2017, 5:07 PM

## 2017-03-17 NOTE — Care Management Note (Signed)
Case Management Note  Patient Details  Name: Catherine Patrick MRN: [id number] Date of Birth: 1966-01-20  Subjective/Objective:   51 yr old female s/p left total knee arthroplasty.                 Action/Plan: Case manager spoke with patient concerning discharge plan and DME needs. Case manager called referral for [facility] therapy to Janeice Robinson, [facility] Liaison. Patient states she has rolling walker and 3in1 at home. She will have family support at discharge.     Expected Discharge Date:    03/18/17              Expected Discharge Plan:  Home w Home Health Services  In-House Referral:  NA  Discharge planning Services  CM Consult  Post Acute Care Choice:  Home Health Choice offered to:  Patient  DME Arranged:  N/A (Patient has RW and 3in1) DME Agency:  NA  HH Arranged:  PT HH Agency:  [facility]  Status of Service:  Completed, signed off  If discussed at Long Length of Stay Meetings, dates discussed:    Additional Comments:  Durenda Guthrie, RN 03/17/2017, 4:14 PM

## 2017-03-17 NOTE — Evaluation (Signed)
Physical Therapy Evaluation Patient Details Name: Catherine Patrick MRN: [id number] DOB: March 17, 1966 Today's Date: 03/17/2017   History of Present Illness  Pt is a 51 yo female admitted for L TKA. PMH significant for R TKA 12/17, H/A, HTN, Kideny stones.   Clinical Impression  Pt is POD 1 and moving slowly with therapy, limited by pain. Assisted pt from bathroom back to bed with cues for sequencing. Adjusted RW for comfort in order to assist with safe mobility. Prior to admission, pt was living with her son in a two level townhouse with bedroom and bathroom upstairs. Pt will need to be able to negotiate 10 stairs to get up to her bedroom. Pt will benefit from HHPT at discharge in order to maximize her outcomes and will require continued acute PT services in order to address deficits listed below.     Follow Up Recommendations Home health PT;Supervision for mobility/OOB    Equipment Recommendations  None recommended by PT    Recommendations for Other Services       Precautions / Restrictions Precautions Precautions: Knee Precaution Booklet Issued: Yes (comment) Precaution Comments: Handout given and reviewed no pillow or towel under knee Restrictions Weight Bearing Restrictions: Yes LLE Weight Bearing: Weight bearing as tolerated      Mobility  Bed Mobility Overal bed mobility: Needs Assistance Bed Mobility: Sit to Supine       Sit to supine: Min assist   General bed mobility comments: min A to bring LLE into bed. Able to positiong self once in bed  Transfers Overall transfer level: Needs assistance Equipment used: Rolling walker (2 wheeled) Transfers: Sit to/from Stand Sit to Stand: Min guard         General transfer comment: Min gaurd for safety from commode  Ambulation/Gait Ambulation/Gait assistance: Min guard Ambulation Distance (Feet): 20 Feet Assistive device: Rolling walker (2 wheeled) Gait Pattern/deviations: Step-to pattern;Decreased step length -  right;Decreased stance time - left;Antalgic Gait velocity: decreased Gait velocity interpretation: Below normal speed for age/gender General Gait Details: Moderate antalgic gait with cues for sequencing with RW.   Stairs            Wheelchair Mobility    Modified Rankin (Stroke Patients Only)       Balance                                             Pertinent Vitals/Pain Pain Assessment: 0-10 Pain Score: 8  Pain Location: left Knee with mobility Pain Descriptors / Indicators: Aching;Grimacing;Guarding;Sore Pain Intervention(s): Monitored during session;Premedicated before session;Repositioned    Home Living Family/patient expects to be discharged to:: Private residence Living Arrangements: Children Available Help at Discharge: Family;Available 24 hours/day Type of Home: Other(Comment) (townhome) Home Access: Stairs to enter Entrance Stairs-Rails: None Entrance Stairs-Number of Steps: 2 Home Layout: Two level;Bed/bath upstairs Home Equipment: Walker - 2 wheels;Bedside commode;Cane - single point      Prior Function Level of Independence: Independent with assistive device(s)         Comments: using cane for mobility     Hand Dominance   Dominant Hand: Right    Extremity/Trunk Assessment   Upper Extremity Assessment Upper Extremity Assessment: Defer to OT evaluation    Lower Extremity Assessment Lower Extremity Assessment: LLE deficits/detail LLE Deficits / Details: Pt with normal post op pain and weakness. No MMT performed, but pt at least 3/5 ankle  and 2/5 knee and hip per gross functional assessment    Cervical / Trunk Assessment Cervical / Trunk Assessment: Normal  Communication   Communication: No difficulties  Cognition Arousal/Alertness: Awake/alert Behavior During Therapy: WFL for tasks assessed/performed Overall Cognitive Status: Within Functional Limits for tasks assessed                                         General Comments      Exercises     Assessment/Plan    PT Assessment Patient needs continued PT services  PT Problem List Decreased strength;Decreased range of motion;Decreased activity tolerance;Decreased balance;Decreased mobility;Pain       PT Treatment Interventions DME instruction;Gait training;Stair training;Functional mobility training;Therapeutic activities;Therapeutic exercise;Balance training;Patient/family education    PT Goals (Current goals can be found in the Care Plan section)  Acute Rehab PT Goals Patient Stated Goal: to have less pain PT Goal Formulation: With patient Time For Goal Achievement: 03/24/17 Potential to Achieve Goals: Good    Frequency 7X/week   Barriers to discharge        Co-evaluation               End of Session Equipment Utilized During Treatment: Gait belt       PT Visit Diagnosis: Difficulty in walking, not elsewhere classified (R26.2);Pain Pain - Right/Left: Left Pain - part of body: Knee    Time: 1610-9604 PT Time Calculation (min) (ACUTE ONLY): 14 min   Charges:   PT Evaluation $PT Eval Low Complexity: 1 Procedure     PT G Codes:        Colin Broach PT, DPT  [phone]   Catherine Patrick 03/17/2017, 11:08 AM

## 2017-03-17 NOTE — Progress Notes (Signed)
Physical Therapy Treatment Patient Details Name: Catherine Patrick MRN: [id number] DOB: 11/02/1966 Today's Date: 03/17/2017    History of Present Illness Pt is a 51 yo female admitted for L TKA. PMH significant for R TKA 12/17, H/A, HTN, Kideny stones.     PT Comments    Pt continues to be moving slowly with therapy and is limited by pain. Pt is able to perform short distance gait to bathroom and back to bed with moderate antalgia noted and increased discomfort once LE's are assisted into bed. Pt prefers to be in CPM for multiple hours a day in lieu of therapy session and mobility. Encouraged pt that mobility is important for recovery. Pt will need to perform stair negotiation prior to discharge in order to ensure safe transition home.     Follow Up Recommendations  Home health PT;Supervision for mobility/OOB     Equipment Recommendations  None recommended by PT    Recommendations for Other Services       Precautions / Restrictions Precautions Precautions: Knee Precaution Booklet Issued: Yes (comment) Precaution Comments: Handout given and reviewed no pillow or towel under knee Restrictions Weight Bearing Restrictions: Yes LLE Weight Bearing: Weight bearing as tolerated    Mobility  Bed Mobility Overal bed mobility: Needs Assistance Bed Mobility: Supine to Sit;Sit to Supine     Supine to sit: Min assist Sit to supine: Min assist   General bed mobility comments: Min A to bring LE's into and out of bed.   Transfers Overall transfer level: Needs assistance Equipment used: Rolling walker (2 wheeled) Transfers: Sit to/from Stand Sit to Stand: Min guard         General transfer comment: Min gaurd for safety from commode and EOB  Ambulation/Gait Ambulation/Gait assistance: Min guard Ambulation Distance (Feet): 30 Feet Assistive device: Rolling walker (2 wheeled) Gait Pattern/deviations: Step-to pattern;Decreased step length - right;Decreased stance time -  left;Antalgic Gait velocity: decreased Gait velocity interpretation: Below normal speed for age/gender General Gait Details: Moderate antalgic gait with cues for sequencing with RW.    Stairs            Wheelchair Mobility    Modified Rankin (Stroke Patients Only)       Balance                                            Cognition Arousal/Alertness: Awake/alert Behavior During Therapy: WFL for tasks assessed/performed Overall Cognitive Status: Within Functional Limits for tasks assessed                                        Exercises      General Comments        Pertinent Vitals/Pain Pain Assessment: 0-10 Pain Score: 5  Pain Location: left Knee with mobility Pain Descriptors / Indicators: Aching;Grimacing;Guarding;Sore Pain Intervention(s): Monitored during session;Premedicated before session;Repositioned;Limited activity within patient's tolerance    Home Living                      Prior Function            PT Goals (current goals can now be found in the care plan section) Acute Rehab PT Goals Patient Stated Goal: to have less pain Progress towards PT goals: Progressing toward goals  Frequency    7X/week      PT Plan Current plan remains appropriate    Co-evaluation             End of Session Equipment Utilized During Treatment: Gait belt Activity Tolerance: Patient limited by pain Patient left: in bed;with call bell/phone within reach;with SCD's reapplied Nurse Communication: Mobility status;Need for lift equipment PT Visit Diagnosis: Difficulty in walking, not elsewhere classified (R26.2);Pain Pain - Right/Left: Left Pain - part of body: Knee     Time: 4098-1191 PT Time Calculation (min) (ACUTE ONLY): 16 min  Charges:  $Gait Training: 8-22 mins                    G Codes:       Colin Broach PT, DPT  [phone]    Catherine Patrick Catherine Patrick 03/17/2017, 4:43 PM

## 2017-03-18 NOTE — Progress Notes (Signed)
Orthopedic Tech Progress Note Patient Details:  Catherine Patrick 16-Nov-1966 [id number] cpm on 1825 Patient ID: [name], female   DOB: December 19, 1965, 51 y.o.   MRN: [id number]   [name] 03/18/2017, 6:26 PM

## 2017-03-18 NOTE — Progress Notes (Signed)
Physical Therapy Treatment Patient Details Name: Catherine Patrick MRN: [id number] DOB: 1966-06-04 Today's Date: 03/18/2017    History of Present Illness Pt is a 51 yo female admitted for L TKA. PMH significant for R TKA 12/17, H/A, HTN, Kideny stones.     PT Comments    Pt is POD 2 and continued to be moving well with therapy. Pt is sitting up in recliner this session and is able to move with decreased c/o pain. Improved sequencing with gait and decreased assistance with transfers noted. Pt continues to require work on stair negotiation prior to DC.     Follow Up Recommendations  Home health PT;Supervision for mobility/OOB     Equipment Recommendations  None recommended by PT    Recommendations for Other Services       Precautions / Restrictions Precautions Precautions: Knee Precaution Booklet Issued: Yes (comment) Precaution Comments: Handout given and reviewed no pillow or towel under knee Restrictions Weight Bearing Restrictions: Yes LLE Weight Bearing: Weight bearing as tolerated    Mobility  Bed Mobility Overal bed mobility: Modified Independent Bed Mobility: Supine to Sit     Supine to sit: Modified independent (Device/Increase time)     General bed mobility comments: pt sitting in recliner when PT arrives  Transfers Overall transfer level: Needs assistance Equipment used: Rolling walker (2 wheeled) Transfers: Sit to/from [employer] Sit to Stand: Supervision Stand pivot transfers: Supervision       General transfer comment: cues to slide LLE forward while transitioning into sitting  Ambulation/Gait Ambulation/Gait assistance: Min guard Ambulation Distance (Feet): 75 Feet Assistive device: Rolling walker (2 wheeled) Gait Pattern/deviations: Step-to pattern;Decreased step length - right;Decreased stance time - left;Antalgic Gait velocity: decreased Gait velocity interpretation: Below normal speed for age/gender General Gait Details:  Moderate antalgic gait with cues for sequencing with RW.    Stairs            Wheelchair Mobility    Modified Rankin (Stroke Patients Only)       Balance                                            Cognition Arousal/Alertness: Awake/alert Behavior During Therapy: WFL for tasks assessed/performed Overall Cognitive Status: Within Functional Limits for tasks assessed                                        Exercises Total Joint Exercises Ankle Circles/Pumps: AROM;Both;20 reps;Supine Quad Sets: AROM;Left;10 reps;Supine    General Comments        Pertinent Vitals/Pain Pain Assessment: 0-10 Pain Score: 6  Pain Location: left knee Pain Descriptors / Indicators: Aching Pain Intervention(s): Monitored during session;Repositioned;Ice applied    Home Living                      Prior Function            PT Goals (current goals can now be found in the care plan section) Acute Rehab PT Goals Patient Stated Goal: to have less pain Progress towards PT goals: Progressing toward goals    Frequency    7X/week      PT Plan Current plan remains appropriate    Co-evaluation  End of Session Equipment Utilized During Treatment: Gait belt Activity Tolerance: Patient limited by pain Patient left: in chair;with call bell/phone within reach Nurse Communication: Mobility status PT Visit Diagnosis: Difficulty in walking, not elsewhere classified (R26.2);Pain Pain - Right/Left: Left Pain - part of body: Knee     Time: 1610-9604 PT Time Calculation (min) (ACUTE ONLY): 22 min  Charges:  $Gait Training: 8-22 mins                    G Codes:       Colin Broach PT, DPT  [phone]    Catherine Patrick Catherine Patrick 03/18/2017, 1:28 PM

## 2017-03-18 NOTE — Progress Notes (Signed)
   Subjective:  Patient reports pain as severe.  More painful than last TKA. Also c/o headache and left arm pain  Objective:   VITALS:   Vitals:   03/17/17 0446 03/17/17 1500 03/17/17 2057 03/18/17 0700  BP: (!) 102/57 132/72 108/74 127/74  Pulse: 78 (!) 10 (!) 106 83  Resp: 16 16    Temp: 98.1 F (36.7 C) 98.2 F (36.8 C) 98.4 F (36.9 C) 97.6 F (36.4 C)  TempSrc: Oral Oral Oral Oral  SpO2: 98% 97% 96% 93%  Weight:      Height:        Neurologically intact ABD soft Neurovascular intact Sensation intact distally Intact pulses distally Dorsiflexion/Plantar flexion intact Incision: dressing C/D/I and no drainage No cellulitis present Compartment soft   Lab Results  Component Value Date   WBC 6.8 03/17/2017   HGB 11.1 (L) 03/17/2017   HCT 34.4 (L) 03/17/2017   MCV 88.9 03/17/2017   PLT 183 03/17/2017     Assessment/Plan:  2 Days Post-Op   - pain is improved today.   - headache is gone - needs to continue to work with PT, mobilizing a little slower than last time - aspirin, SCDs, ambulation - anticipate ready for dc home Sunday - Rx in chart  Glee Arvin 03/18/2017, 8:54 AM [phone]

## 2017-03-18 NOTE — Progress Notes (Signed)
Occupational Therapy Treatment Patient Details Name: Catherine Patrick MRN: [id number] DOB: Feb 25, 1966 Today's Date: 03/18/2017    History of present illness Pt is a 51 yo female admitted for L TKA. PMH significant for R TKA 12/17, H/A, HTN, Kideny stones.    OT comments  Pt. Making gains with skilled OT.  Able to complete bed mobility and amb. To/from b.room.  Plan on provided A/E tomorrow and reviewing use for completion of LB ADLS.   Follow Up Recommendations  No OT follow up;Supervision - Intermittent    Equipment Recommendations  None recommended by OT    Recommendations for Other Services      Precautions / Restrictions Precautions Precautions: Knee Restrictions Weight Bearing Restrictions: (P) Yes LLE Weight Bearing: Weight bearing as tolerated       Mobility Bed Mobility Overal bed mobility: Modified Independent Bed Mobility: Supine to Sit     Supine to sit: Modified independent (Device/Increase time)     General bed mobility comments: pt. able to come into long sitting and manage guiding BLEs oob and then scoot B hips towards eob  Transfers Overall transfer level: Needs assistance Equipment used: Rolling walker (2 wheeled) Transfers: Sit to/from [employer] Sit to Stand: Supervision Stand pivot transfers: Supervision       General transfer comment: cues to slide LLE forward while transitioning into sitting    Balance                                           ADL either performed or assessed with clinical judgement   ADL Overall ADL's : Needs assistance/impaired                         Toilet Transfer: Min guard;Cueing for sequencing;Ambulation;RW;Regular Toilet;Grab bars   Toileting- Clothing Manipulation and Hygiene: Supervision/safety;Sitting/lateral lean               Vision       Perception     Praxis      Cognition Arousal/Alertness: Awake/alert Behavior During Therapy: WFL for tasks  assessed/performed Overall Cognitive Status: Within Functional Limits for tasks assessed                                          Exercises     Shoulder Instructions       General Comments      Pertinent Vitals/ Pain       Pain Location: left knee Pain Descriptors / Indicators: Aching  Home Living                                          Prior Functioning/Environment              Frequency  Min 2X/week        Progress Toward Goals  OT Goals(current goals can now be found in the care plan section)  Progress towards OT goals: Progressing toward goals     Plan Discharge plan remains appropriate    Co-evaluation                 End of Session Equipment Utilized During Treatment: Gait belt;Rolling walker  OT Visit Diagnosis: Other abnormalities of gait and mobility (R26.89);Pain Pain - Right/Left: Left Pain - part of body: Knee   Activity Tolerance Patient tolerated treatment well   Patient Left in chair;with call bell/phone within reach   Nurse Communication          Time: 4098-1191 OT Time Calculation (min): 18 min  Charges: OT General Charges $OT Visit: 1 Procedure OT Treatments $Self Care/Home Management : 8-22 mins   Robet Leu, COTA/L 03/18/2017, 10:57 AM

## 2017-03-18 NOTE — Progress Notes (Signed)
Physical Therapy Treatment Note:  Clinical Impression: Pt continues to be limited by pain this session. Pt is concerned about returning home and having to climb her stairs due to her discomfort with short distance gait. Will continue to progress gait distance and perform stair negotiation prior to DC home. Pt is improving with short distance mobility.     03/18/17 1601  PT Visit Information  Last PT Received On 03/18/17  Assistance Needed +1  History of Present Illness Pt is a 51 yo female admitted for L TKA. PMH significant for R TKA 12/17, H/A, HTN, Kideny stones.   Subjective Data  Subjective pt states that she is ready to get out of the CPM  Patient Stated Goal to have less pain  Precautions  Precautions Knee  Precaution Booklet Issued Yes (comment)  Precaution Comments Handout given and reviewed no pillow or towel under knee  Restrictions  Weight Bearing Restrictions Yes  LLE Weight Bearing WBAT  Pain Assessment  Pain Assessment Faces  Faces Pain Scale 6  Pain Location left knee with mobility  Pain Descriptors / Indicators Aching;Grimacing;Guarding;Sore  Pain Intervention(s) Monitored during session;Premedicated before session;Patient requesting pain meds-RN notified;Ice applied  Cognition  Arousal/Alertness Awake/alert  Behavior During Therapy WFL for tasks assessed/performed  Overall Cognitive Status Within Functional Limits for tasks assessed  Bed Mobility  Overal bed mobility Needs Assistance  Bed Mobility Supine to Sit;Sit to Supine  Supine to sit Mod assist  Sit to supine Min guard  General bed mobility comments Mod A this session to bring LLE EOB and to manevuer to EOB. Min Guard to get back into bed  Transfers  Overall transfer level Needs assistance  Equipment used Rolling walker (2 wheeled)  Transfers Sit to/from Stand  Sit to Stand Supervision  General transfer comment Supervision to stand briefly at EOB.   Exercises  Exercises Total Joint  Total Joint  Exercises  Ankle Circles/Pumps AROM;Both;20 reps;Supine  Quad Sets AROM;Left;10 reps;Supine  Heel Slides AAROM;Left;10 reps;Supine  Goniometric ROM 2-65  PT - End of Session  Equipment Utilized During Treatment Gait belt  Activity Tolerance Patient limited by pain  Patient left in bed;with call bell/phone within reach;with family/visitor present  Nurse Communication Mobility status  CPM Left Knee  CPM Left Knee Off  PT - Assessment/Plan  PT Plan Current plan remains appropriate  PT Visit Diagnosis Difficulty in walking, not elsewhere classified (R26.2);Pain  Pain - Right/Left Left  Pain - part of body Knee  PT Frequency (ACUTE ONLY) 7X/week  Follow Up Recommendations Home health PT;Supervision for mobility/OOB  PT equipment None recommended by PT  AM-PAC PT "6 Clicks" Daily Activity Outcome Measure  Difficulty turning over in bed (including adjusting bedclothes, sheets and blankets)? 4  Difficulty moving from lying on back to sitting on the side of the bed?  4  Difficulty sitting down on and standing up from a chair with arms (e.g., wheelchair, bedside commode, etc,.)? 4  Help needed moving to and from a bed to chair (including a wheelchair)? 4  Help needed walking in hospital room? 3  Help needed climbing 3-5 steps with a railing?  3  6 Click Score 22  Mobility G Code  CJ  PT Goal Progression  Progress towards PT goals Progressing toward goals  PT Time Calculation  PT Start Time (ACUTE ONLY) 1523  PT Stop Time (ACUTE ONLY) 1548  PT Time Calculation (min) (ACUTE ONLY) 25 min  PT General Charges  $$ ACUTE PT VISIT 1 Procedure  PT Treatments  $Therapeutic Exercise 8-22 mins  $Therapeutic Activity 8-22 mins   Colin Broach PT, DPT  [phone]

## 2017-03-19 NOTE — Progress Notes (Signed)
Occupational Therapy Treatment Patient Details Name: Paticia Moster MRN: [id number] DOB: May 26, 1966 Today's Date: 03/19/2017    History of present illness Pt is a 51 yo female admitted for L TKA. PMH significant for R TKA 12/17, H/A, HTN, Kideny stones.    OT comments  Pt. Making gains with skilled OT.  States she feels like she is getting better.  Completed bed mobility, toileting, and A/E training this session. Will continue to follow acutely.    Follow Up Recommendations  No OT follow up;Supervision - Intermittent    Equipment Recommendations  None recommended by OT    Recommendations for Other Services      Precautions / Restrictions Precautions Precautions: Knee Precaution Booklet Issued: Yes (comment) Precaution Comments: Handout given and reviewed no pillow or towel under knee Restrictions Weight Bearing Restrictions: Yes LLE Weight Bearing: Weight bearing as tolerated       Mobility Bed Mobility Overal bed mobility: Modified Independent       Supine to sit: Modified independent (Device/Increase time)     General bed mobility comments: hob almost completely flat, utilized a scarf to guide LLE OOB and scoot to eob without any physical assistance  Transfers Overall transfer level: Needs assistance Equipment used: Rolling walker (2 wheeled) Transfers: Sit to/from [facility] Sit to Stand: Supervision         General transfer comment: Supervision for safety from recliner to RW    Balance                                           ADL either performed or assessed with clinical judgement   ADL Overall ADL's : Needs assistance/impaired     Grooming: Wash/dry hands;Standing;Supervision/safety       Lower Body Bathing: With adaptive equipment Lower Body Bathing Details (indicate cue type and reason): provided LH sponge and explained use Upper Body Dressing : Minimal assistance;With adaptive equipment;Sitting Upper Body  Dressing Details (indicate cue type and reason): pt. liked reacher but was unable to use sock aide secondary to c/o L hip pain when pulling on sock aide.  able to bend completely forward and don/doff socks and LB garments.  said it felt better than use of sock aide Lower Body Dressing: Minimal assistance;With adaptive equipment;Sitting/lateral leans;Sit to/from stand Lower Body Dressing Details (indicate cue type and reason): provided A/E kit and demonstrated and had pt. return demo for use Toilet Transfer: Min guard;Regular Toilet;RW;Ambulation;Grab bars   Toileting- Clothing Manipulation and Hygiene: Set up;Sitting/lateral lean       Functional mobility during ADLs: Min guard;Rolling walker General ADL Comments: provided A/E kit for LB ADL completion.  pt. moving better today and is able to mangage functional mobility with less time or assist.  still self limiting and nervous.  relying on a scarf to move LLE when she can move and manage without assistance     Vision       Perception     Praxis      Cognition Arousal/Alertness: Awake/alert Behavior During Therapy: WFL for tasks assessed/performed Overall Cognitive Status: Within Functional Limits for tasks assessed                                          Exercises Total Joint Exercises Quad Sets: AROM;Left;10 reps;Supine Hip  ABduction/ADduction: AAROM;Left;10 reps;Supine Knee Flexion: AROM;Left;10 reps;Seated Goniometric ROM: 2-65   Shoulder Instructions       General Comments      Pertinent Vitals/ Pain       Pain Assessment: No/denies pain Faces Pain Scale: Hurts little more Pain Location: left knee with mobility Pain Descriptors / Indicators: Aching;Grimacing;Guarding;Sore Pain Intervention(s): Monitored during session;Premedicated before session;Ice applied;RN gave pain meds during session  Home Living                                          Prior Functioning/Environment               Frequency  Min 2X/week        Progress Toward Goals  OT Goals(current goals can now be found in the care plan section)  Progress towards OT goals: Progressing toward goals  Acute Rehab OT Goals Patient Stated Goal: to have less pain  Plan Discharge plan remains appropriate    Co-evaluation                 End of Session Equipment Utilized During Treatment: Gait belt;Rolling walker CPM Left Knee CPM Left Knee: On Left Knee Flexion (Degrees): 65  OT Visit Diagnosis: Other abnormalities of gait and mobility (R26.89);Pain Pain - Right/Left: Left Pain - part of body: Knee   Activity Tolerance Patient tolerated treatment well   Patient Left in chair;with call bell/phone within reach   Nurse Communication          Time: 1013-1030 OT Time Calculation (min): 17 min  Charges: OT General Charges $OT Visit: 1 Procedure OT Treatments $Self Care/Home Management : 8-22 mins   Janice Coffin, COTA/L 03/19/2017, 11:28 AM

## 2017-03-19 NOTE — Progress Notes (Signed)
Subjective: 3 Days Post-Op Procedure(s) (LRB): LEFT TOTAL KNEE ARTHROPLASTY (Left) Patient reports pain as moderate to severe.   Slow progress with PT. Wanting to stay another day for PT especially steps.  Objective: Patrick signs in last 24 hours: Temp:  [98.1 F (36.7 C)-98.4 F (36.9 C)] 98.1 F (36.7 C) (04/15 0636) Pulse Rate:  [87-94] 87 (04/15 0636) Resp:  [18] 18 (04/15 0500) BP: (118-147)/(76-82) 118/76 (04/15 0636) SpO2:  [95 %-98 %] 98 % (04/15 0636)  Intake/Output from previous day: 04/14 0701 - 04/15 0700 In: 200 [P.O.:200] Out: -  Intake/Output this shift: Total I/O In: 360 [P.O.:360] Out: 400 [Urine:400]   Recent Labs  03/17/17 0407  HGB 11.1*    Recent Labs  03/17/17 0407  WBC 6.8  RBC 3.87  HCT 34.4*  PLT 183    Recent Labs  03/17/17 0407  NA 141  K 3.6  CL 107  CO2 27  BUN 10  CREATININE 0.59  GLUCOSE 135*  CALCIUM 8.1*   No results for input(s): LABPT, INR in the last 72 hours.  Sensation intact distally Incision: dressing C/D/I Compartment soft  Assessment/Plan: 3 Days Post-Op Procedure(s) (LRB): LEFT TOTAL KNEE ARTHROPLASTY (Left) Up with therapy  Discharge tomorrow.   Catherine Patrick 03/19/2017, 9:49 AM

## 2017-03-19 NOTE — Progress Notes (Signed)
Physical Therapy Treatment Patient Details Name: Catherine Patrick MRN: [id number] DOB: 1966-10-14 Today's Date: 03/19/2017    History of Present Illness Pt is a 51 yo female admitted for L TKA. PMH significant for R TKA 12/17, H/A, HTN, Kideny stones.     PT Comments    Continuing work on gait and mobility; Session focused on stair training, which we initiated on practice stairs; worth considering getting in stairwell to practice in am session tomorrow; Able to incr amb distance, and Catherine Patrick seemed pleased with her progress   Follow Up Recommendations  Home health PT;Supervision for mobility/OOB     Equipment Recommendations  None recommended by PT    Recommendations for Other Services       Precautions / Restrictions Precautions Precautions: Knee Precaution Booklet Issued: Yes (comment) Precaution Comments: Handout given and reviewed no pillow or towel under knee Restrictions Weight Bearing Restrictions: Yes LLE Weight Bearing: Weight bearing as tolerated    Mobility  Bed Mobility Overal bed mobility: Modified Independent Bed Mobility: Supine to Sit;Sit to Supine     Supine to sit: Modified independent (Device/Increase time) Sit to supine: Modified independent (Device/Increase time)   General bed mobility comments: hob almost completely flat, utilized a scarf to guide LLE OOB and scoot to eob without any physical assistance  Transfers Overall transfer level: Needs assistance Equipment used: Rolling walker (2 wheeled) Transfers: Sit to/from Stand Sit to Stand: Supervision         General transfer comment: Supervision for safety; good hand placement without cues  Ambulation/Gait Ambulation/Gait assistance: Min guard Ambulation Distance (Feet): 120 Feet Assistive device: Rolling walker (2 wheeled) Gait Pattern/deviations: Step-to pattern;Decreased step length - right;Decreased stance time - left;Antalgic Gait velocity: decreased Gait velocity interpretation:  Below normal speed for age/gender General Gait Details: Cues to activate quad for L stance stability   Stairs Stairs: Yes   Stair Management: No rails;With walker;Backwards;One rail Right;Forwards Number of Stairs: 4 (x2) General stair comments: Performed ascend and descending stairs with RW and min assist to approximate her steps to enter her home; then performed stairs with one rails R and handheld assist on L to approximate steps inside her home; cues for sequence and technqiue;  Wheelchair Mobility    Modified Rankin (Stroke Patients Only)       Balance             Standing balance-Leahy Scale: Fair                              Cognition Arousal/Alertness: Awake/alert Behavior During Therapy: WFL for tasks assessed/performed Overall Cognitive Status: Within Functional Limits for tasks assessed                                        Exercises Total Joint Exercises Quad Sets: AROM;Left;10 reps;Supine Hip ABduction/ADduction: AAROM;Left;10 reps;Supine Knee Flexion: AROM;Left;10 reps;Seated Goniometric ROM: 2-65    General Comments        Pertinent Vitals/Pain Pain Assessment: 0-10 Pain Score: 7  Faces Pain Scale: Hurts little more Pain Location: left knee with mobility Pain Descriptors / Indicators: Aching;Grimacing;Guarding;Sore Pain Intervention(s): Limited activity within patient's tolerance    Home Living                      Prior Function  PT Goals (current goals can now be found in the care plan section) Acute Rehab PT Goals Patient Stated Goal: to have less pain PT Goal Formulation: With patient Time For Goal Achievement: 03/24/17 Potential to Achieve Goals: Good Progress towards PT goals: Progressing toward goals    Frequency    7X/week      PT Plan Current plan remains appropriate    Co-evaluation             End of Session Equipment Utilized During Treatment: Gait  belt Activity Tolerance: Patient tolerated treatment well Patient left: in bed;in CPM;with call bell/phone within reach Nurse Communication: Mobility status PT Visit Diagnosis: Difficulty in walking, not elsewhere classified (R26.2);Pain Pain - Right/Left: Left Pain - part of body: Knee     Time: 1610-9604 PT Time Calculation (min) (ACUTE ONLY): 35 min  Charges:  $Gait Training: 23-37 mins                    G Codes:       Van Clines, PT  Acute Rehabilitation Services Pager [phone] Office [phone]    Catherine Patrick 03/19/2017, 2:50 PM

## 2017-03-19 NOTE — Progress Notes (Signed)
Physical Therapy Treatment Patient Details Name: Catherine Patrick MRN: [id number] DOB: 02/17/1966 Today's Date: 03/19/2017    History of Present Illness Pt is a 51 yo female admitted for L TKA. PMH significant for R TKA 12/17, H/A, HTN, Kideny stones.     PT Comments    Pt is POD 2 and is moving slowly, but better with therapy this session. Performed increased gait distance and performed long sitting and sitting exercises. Pt continues to be somewhat self-limiting for fear of pain. Will attempt stair negotiation next visit in order to review prior to DC hopefully tomorrow.     Follow Up Recommendations  Home health PT;Supervision for mobility/OOB     Equipment Recommendations  None recommended by PT    Recommendations for Other Services       Precautions / Restrictions Precautions Precautions: Knee Precaution Booklet Issued: Yes (comment) Precaution Comments: Handout given and reviewed no pillow or towel under knee Restrictions Weight Bearing Restrictions: Yes LLE Weight Bearing: Weight bearing as tolerated    Mobility  Bed Mobility               General bed mobility comments: OOB in recliner when PT arrives  Transfers Overall transfer level: Needs assistance Equipment used: Rolling walker (2 wheeled) Transfers: Sit to/from Stand Sit to Stand: Supervision         General transfer comment: Supervision for safety from recliner to RW  Ambulation/Gait Ambulation/Gait assistance: Min guard Ambulation Distance (Feet): 100 Feet Assistive device: Rolling walker (2 wheeled) Gait Pattern/deviations: Step-to pattern;Decreased step length - right;Decreased stance time - left;Antalgic Gait velocity: decreased Gait velocity interpretation: Below normal speed for age/gender General Gait Details: Moderate antalgic gait with cues for sequencing with RW.    Stairs            Wheelchair Mobility    Modified Rankin (Stroke Patients Only)       Balance                                             Cognition Arousal/Alertness: Awake/alert Behavior During Therapy: WFL for tasks assessed/performed Overall Cognitive Status: Within Functional Limits for tasks assessed                                        Exercises Total Joint Exercises Quad Sets: AROM;Left;10 reps;Supine Hip ABduction/ADduction: AAROM;Left;10 reps;Supine Knee Flexion: AROM;Left;10 reps;Seated Goniometric ROM: 2-65    General Comments        Pertinent Vitals/Pain Pain Assessment: Faces Faces Pain Scale: Hurts little more Pain Location: left knee with mobility Pain Descriptors / Indicators: Aching;Grimacing;Guarding;Sore Pain Intervention(s): Monitored during session;Premedicated before session;Ice applied;RN gave pain meds during session    Home Living                      Prior Function            PT Goals (current goals can now be found in the care plan section) Acute Rehab PT Goals Patient Stated Goal: to have less pain Progress towards PT goals: Progressing toward goals    Frequency    7X/week      PT Plan Current plan remains appropriate    Co-evaluation  End of Session Equipment Utilized During Treatment: Gait belt Activity Tolerance: Patient tolerated treatment well Patient left: in chair;with call bell/phone within reach Nurse Communication: Mobility status PT Visit Diagnosis: Difficulty in walking, not elsewhere classified (R26.2);Pain Pain - Right/Left: Left Pain - part of body: Knee     Time: 1610-9604 PT Time Calculation (min) (ACUTE ONLY): 20 min  Charges:  $Gait Training: 8-22 mins                    G Codes:       Colin Broach PT, DPT  [phone]    Catherine Patrick 03/19/2017, 11:02 AM

## 2017-03-20 ENCOUNTER — Telehealth (INDEPENDENT_AMBULATORY_CARE_PROVIDER_SITE_OTHER): Payer: Self-pay | Admitting: Orthopaedic Surgery

## 2017-03-20 NOTE — Progress Notes (Signed)
Physical Therapy Treatment Patient Details Name: Catherine Patrick MRN: [id number] DOB: 1966/01/22 Today's Date: 03/20/2017    History of Present Illness Pt is a 51 yo female admitted for L TKA. PMH significant for R TKA 12/17, H/A, HTN, Kideny stones.     PT Comments    Pt negotiated 10 stairs with rail and cane and MIN/guard with ascent and MIN A with descent.  Pt is scheduled to d/c home today. Con't to recommend HHPT.   Follow Up Recommendations  Home health PT;Supervision for mobility/OOB     Equipment Recommendations  None recommended by PT    Recommendations for Other Services       Precautions / Restrictions Precautions Precautions: Knee Restrictions Weight Bearing Restrictions: Yes LLE Weight Bearing: Weight bearing as tolerated    Mobility  Bed Mobility Overal bed mobility: Modified Independent Bed Mobility: Supine to Sit     Supine to sit: Modified independent (Device/Increase time)     General bed mobility comments: increased time  Transfers Overall transfer level: Needs assistance Equipment used: Rolling walker (2 wheeled) Transfers: Sit to/from Stand Sit to Stand: Supervision         General transfer comment: Supervision for safety; good hand placement without cues  Ambulation/Gait Ambulation/Gait assistance: Min guard;Supervision Ambulation Distance (Feet): 150 Feet Assistive device: Rolling walker (2 wheeled) Gait Pattern/deviations: Step-to pattern;Leaning posteriorly;Trunk flexed Gait velocity: decreased   General Gait Details: Antalgic gait pattern, but improved to S level by end of gait   Stairs Stairs: Yes   Stair Management: One rail Right;One rail Left;With cane Number of Stairs: 10 General stair comments: Practiced 10 stairs with cane and rail.  MIN/guard for ascent and MIN for descent.  Pt fatigued and did not want to practice backwards technique with RW, but states she feels good about it from yesterday and from recent surgery on  other knee  Wheelchair Mobility    Modified Rankin (Stroke Patients Only)       Balance Overall balance assessment: Needs assistance   Sitting balance-Leahy Scale: Good     Standing balance support: Bilateral upper extremity supported Standing balance-Leahy Scale: Fair                              Cognition Arousal/Alertness: Awake/alert Behavior During Therapy: WFL for tasks assessed/performed Overall Cognitive Status: Within Functional Limits for tasks assessed                                        Exercises Total Joint Exercises Ankle Circles/Pumps: AROM;Both;10 reps;Supine Quad Sets: AROM;Left;10 reps;Supine Heel Slides: AAROM    General Comments        Pertinent Vitals/Pain Pain Assessment: 0-10 Pain Score: 6  Pain Location: left knee with mobility Pain Descriptors / Indicators: Aching;Grimacing;Guarding;Sore Pain Intervention(s): Limited activity within patient's tolerance;Monitored during session;Premedicated before session;Repositioned    Home Living                      Prior Function            PT Goals (current goals can now be found in the care plan section) Acute Rehab PT Goals Patient Stated Goal: to have less pain PT Goal Formulation: With patient Time For Goal Achievement: 03/24/17 Potential to Achieve Goals: Good Progress towards PT goals: Progressing toward goals    Frequency  7X/week      PT Plan Current plan remains appropriate    Co-evaluation             End of Session Equipment Utilized During Treatment: Gait belt Activity Tolerance: Patient tolerated treatment well Patient left: in chair;with call bell/phone within reach Nurse Communication: Mobility status PT Visit Diagnosis: Difficulty in walking, not elsewhere classified (R26.2);Pain Pain - Right/Left: Left Pain - part of body: Knee     Time: 1191-4782 PT Time Calculation (min) (ACUTE ONLY): 35 min  Charges:   $Gait Training: 23-37 mins                    G Codes:       Naina Sleeper L. Katrinka Blazing, [place] Pager [phone] 03/20/2017    Enzo Montgomery 03/20/2017, 11:44 AM

## 2017-03-20 NOTE — Telephone Encounter (Signed)
She should have had a script from the hospital

## 2017-03-20 NOTE — Telephone Encounter (Signed)
PT REQUEST REFILL OF OXYCODONE  [phone]

## 2017-03-20 NOTE — Progress Notes (Signed)
Discharge instructions (including medications) discussed with and copy provided to patient/caregiver 

## 2017-03-20 NOTE — Discharge Summary (Signed)
Physician Discharge Summary      Patient ID: Catherine Patrick MRN: [id number] DOB/AGE: 1966-11-25 51 y.o.  Admit date: 03/16/2017 Discharge date: 03/20/2017  Admission Diagnoses:  <principal problem not specified>  Discharge Diagnoses:  Active Problems:   Total knee replacement status   Past Medical History:  Diagnosis Date  . DJD (degenerative joint disease)   . Headache   . History of blood transfusion    2 times after child birth  . History of kidney stones    passed one in 2014- 2017  . Hypertension   . Kidney stones     Surgeries: Procedure(s): LEFT TOTAL KNEE ARTHROPLASTY on 03/16/2017   Consultants (if any):   Discharged Condition: Improved  Hospital Course: Catherine Patrick is an 51 y.o. female who was admitted 03/16/2017 with a diagnosis of <principal problem not specified> and went to the operating room on 03/16/2017 and underwent the above named procedures.    She was given perioperative antibiotics:  Anti-infectives    Start     Dose/Rate Route Frequency Ordered Stop   03/16/17 2200  ceFAZolin (ANCEF) IVPB 2g/100 mL premix     2 g 200 mL/hr over 30 Minutes Intravenous Every 6 hours 03/16/17 2015 03/17/17 0423   03/16/17 1330  ceFAZolin (ANCEF) IVPB 2g/100 mL premix     2 g 200 mL/hr over 30 Minutes Intravenous To ShortStay Surgical 03/15/17 0846 03/16/17 1430    .  She was given sequential compression devices, early ambulation, and aspirin for DVT prophylaxis.  She benefited maximally from the hospital stay and there were no complications.    Recent vital signs:  Vitals:   03/19/17 1500 03/19/17 2056  BP: 117/63 135/65  Pulse: (!) 110 94  Resp: 18 18  Temp: 98 F (36.7 C) 98.9 F (37.2 C)    Recent laboratory studies:  Lab Results  Component Value Date   HGB 11.1 (L) 03/17/2017   HGB 13.8 03/14/2017   HGB 10.5 (L) 12/11/2016   Lab Results  Component Value Date   WBC 6.8 03/17/2017   PLT 183 03/17/2017   Lab Results  Component Value Date   INR 1.12 03/14/2017   Lab Results  Component Value Date   NA 141 03/17/2017   K 3.6 03/17/2017   CL 107 03/17/2017   CO2 27 03/17/2017   BUN 10 03/17/2017   CREATININE 0.59 03/17/2017   GLUCOSE 135 (H) 03/17/2017    Discharge Medications:   Allergies as of 03/20/2017      Reactions   Amlodipine Palpitations   WITH FIRST DOSES BEGAN AFTER EACH DOSE RESOLVED WHEN D/C'd    Pork-derived Products    PATIENT PREFERENCE. CULTURAL DICTATES      Medication List    TAKE these medications   acetaminophen 500 MG tablet Commonly known as:  TYLENOL Take 2 tablets (1,000 mg total) by mouth every 6 (six) hours as needed.   acetaminophen-codeine 300-30 MG tablet Commonly known as:  TYLENOL #3 Take 1-2 tablets by mouth at bedtime as needed for moderate pain.   amLODipine 5 MG tablet Commonly known as:  NORVASC Take 1 tablet (5 mg total) by mouth daily.   aspirin EC 325 MG tablet Take 1 tablet (325 mg total) by mouth 2 (two) times daily. What changed:  Another medication with the same name was added. Make sure you understand how and when to take each.   aspirin EC 325 MG tablet Take 1 tablet (325 mg total) by mouth 2 (two) times daily.  What changed:  You were already taking a medication with the same name, and this prescription was added. Make sure you understand how and when to take each.   cholecalciferol 1000 units tablet Commonly known as:  VITAMIN D Take 1,000 Units by mouth daily.   Glucosamine-Chondroitin 750-600 MG Tabs Take 2 tablets by mouth daily.   HYDROcodone-acetaminophen 7.5-325 MG tablet Commonly known as:  NORCO 1 tab po bid prn pain   ibuprofen 600 MG tablet Commonly known as:  ADVIL,MOTRIN Take 1 tablet (600 mg total) by mouth every 8 (eight) hours as needed. What changed:  Another medication with the same name was changed. Make sure you understand how and when to take each.   ibuprofen 800 MG tablet Commonly known as:  ADVIL,MOTRIN Take 1 tablet (800  mg total) by mouth every 8 (eight) hours as needed. What changed:  when to take this  reasons to take this   methocarbamol 750 MG tablet Commonly known as:  ROBAXIN Take 1 tablet (750 mg total) by mouth 2 (two) times daily as needed for muscle spasms. What changed:  Another medication with the same name was changed. Make sure you understand how and when to take each.   methocarbamol 750 MG tablet Commonly known as:  ROBAXIN Take 1 tablet (750 mg total) by mouth 2 (two) times daily as needed for muscle spasms. What changed:  medication strength  how much to take  when to take this   ondansetron 4 MG tablet Commonly known as:  ZOFRAN Take 1-2 tablets (4-8 mg total) by mouth every 8 (eight) hours as needed for nausea or vomiting. What changed:  Another medication with the same name was added. Make sure you understand how and when to take each.   ondansetron 4 MG tablet Commonly known as:  ZOFRAN Take 1-2 tablets (4-8 mg total) by mouth every 8 (eight) hours as needed for nausea or vomiting. What changed:  You were already taking a medication with the same name, and this prescription was added. Make sure you understand how and when to take each.   oxyCODONE 5 MG immediate release tablet Commonly known as:  Oxy IR/ROXICODONE Take 1-3 tablets (5-15 mg total) by mouth every 4 (four) hours as needed. What changed:  Another medication with the same name was added. Make sure you understand how and when to take each.   oxyCODONE 10 mg 12 hr tablet Commonly known as:  OXYCONTIN Take 1 tablet (10 mg total) by mouth every 12 (twelve) hours. What changed:  Another medication with the same name was added. Make sure you understand how and when to take each.   oxyCODONE 5 MG immediate release tablet Commonly known as:  Oxy IR/ROXICODONE Take 1-2 tablets (5-10 mg total) by mouth every 6 (six) hours as needed. What changed:  Another medication with the same name was added. Make sure you  understand how and when to take each.   oxyCODONE 5 MG immediate release tablet Commonly known as:  Oxy IR/ROXICODONE Take 1-3 tablets (5-15 mg total) by mouth every 4 (four) hours as needed. What changed:  You were already taking a medication with the same name, and this prescription was added. Make sure you understand how and when to take each.   oxyCODONE 10 mg 12 hr tablet Commonly known as:  OXYCONTIN Take 1 tablet (10 mg total) by mouth every 12 (twelve) hours. What changed:  You were already taking a medication with the same name, and this prescription  was added. Make sure you understand how and when to take each.   promethazine 25 MG tablet Commonly known as:  PHENERGAN Take 1 tablet (25 mg total) by mouth every 6 (six) hours as needed for nausea. What changed:  Another medication with the same name was added. Make sure you understand how and when to take each.   promethazine 25 MG tablet Commonly known as:  PHENERGAN Take 1 tablet (25 mg total) by mouth every 6 (six) hours as needed for nausea. What changed:  You were already taking a medication with the same name, and this prescription was added. Make sure you understand how and when to take each.   senna-docusate 8.6-50 MG tablet Commonly known as:  SENOKOT S Take 1 tablet by mouth at bedtime as needed. What changed:  Another medication with the same name was added. Make sure you understand how and when to take each.   senna-docusate 8.6-50 MG tablet Commonly known as:  SENOKOT S Take 1 tablet by mouth at bedtime as needed. What changed:  You were already taking a medication with the same name, and this prescription was added. Make sure you understand how and when to take each.   Turmeric 500 MG Caps Take 500 mg by mouth daily.            Durable Medical Equipment        Start     Ordered   03/16/17 2016  DME Walker rolling  Once    Question:  Patient needs a walker to treat with the following condition   Answer:  Total knee replacement status   03/16/17 2015   03/16/17 2016  DME 3 n 1  Once     03/16/17 2015   03/16/17 2016  DME Bedside commode  Once    Question:  Patient needs a bedside commode to treat with the following condition  Answer:  Total knee replacement status   03/16/17 2015      Diagnostic Studies: Dg Knee Left Port  Result Date: 03/16/2017 CLINICAL DATA:  51 y/o  F; status post left knee arthroplasty. EXAM: PORTABLE LEFT KNEE - 1-2 VIEW COMPARISON:  09/29/2015 left knee radiographs. FINDINGS: Tricompartmental total knee arthroplasty without apparent hardware related complication. There is air and edema within the subcutaneous tissues in the joint space. No acute fracture identified. IMPRESSION: Expected postsurgical changes post tricompartmental total knee arthroplasty. Electronically Signed   By: Mitzi Hansen M.D.   On: 03/16/2017 19:45    Disposition: 01-Home or Self Care  Discharge Instructions    Call MD / Call 911    Complete by:  As directed    If you experience chest pain or shortness of breath, CALL 911 and be transported to the hospital emergency room.  If you develope a fever above 101.5 F, pus (white drainage) or increased drainage or redness at the wound, or calf pain, call your surgeon's office.   Constipation Prevention    Complete by:  As directed    Drink plenty of fluids.  Prune juice may be helpful.  You may use a stool softener, such as Colace (over the counter) 100 mg twice a day.  Use MiraLax (over the counter) for constipation as needed.   Driving restrictions    Complete by:  As directed    No driving while taking narcotic pain meds.   Increase activity slowly as tolerated    Complete by:  As directed       Follow-up Information  Glee Arvin, MD Follow up in 2 week(s).   Specialty:  Orthopedic Surgery Why:  For wound re-check, For suture removal Contact information: [address] [place]  [address] [phone]        [facility] Follow up.   Why:  A representative from [facility] will contact you concerning start date and time to start your therapy. Contact information: [address] [place] [address] [phone]            Signed: Glee Arvin 03/20/2017, 7:06 AM

## 2017-03-20 NOTE — Telephone Encounter (Signed)
Please advise 

## 2017-03-22 ENCOUNTER — Telehealth (INDEPENDENT_AMBULATORY_CARE_PROVIDER_SITE_OTHER): Payer: Self-pay | Admitting: *Deleted

## 2017-03-22 NOTE — Telephone Encounter (Signed)
Pt calling stating she had the script, turned it into [facility] [address] but needs PA sent.

## 2017-03-22 NOTE — Telephone Encounter (Signed)
Called pt and she states both Rx needs a PA (oxycodone & oxycontin) Asked wendy to help me do PA throught [employer] [facility] [employer].    PENDING

## 2017-03-23 NOTE — Telephone Encounter (Signed)
[place] [facility] Approvals for both RX  1. Oxycodone Confirmation #:[id number] W  2. Oxycontin  Confirmation #: [id number] W

## 2017-03-23 NOTE — Telephone Encounter (Signed)
Patient aware Jodene Nam told pt

## 2017-03-24 ENCOUNTER — Telehealth (INDEPENDENT_AMBULATORY_CARE_PROVIDER_SITE_OTHER): Payer: Self-pay | Admitting: Orthopaedic Surgery

## 2017-03-24 NOTE — Telephone Encounter (Signed)
Patient called advised she only rec'd 10 tabs for Rx (Oxycontin) Patient asked if she can get another Rx. Patient advised she is out of medication. Patient said she had to pay out of pocket for her medication. The number to contact patient is [phone]

## 2017-03-27 ENCOUNTER — Telehealth (INDEPENDENT_AMBULATORY_CARE_PROVIDER_SITE_OTHER): Payer: Self-pay | Admitting: Orthopaedic Surgery

## 2017-03-27 NOTE — Telephone Encounter (Signed)
I only give 10 of oxycontin

## 2017-03-27 NOTE — Telephone Encounter (Signed)
Please advise 

## 2017-03-27 NOTE — Telephone Encounter (Signed)
Patient called about her oxycodone refill. CB # [phone]

## 2017-03-28 NOTE — Telephone Encounter (Signed)
Called pt she states she finished oxycontin and still has oxycodone left  

## 2017-03-28 NOTE — Telephone Encounter (Signed)
Called pt she states she finished oxycontin and still has oxycodone left

## 2017-03-30 ENCOUNTER — Ambulatory Visit (INDEPENDENT_AMBULATORY_CARE_PROVIDER_SITE_OTHER): Payer: Medicaid Other | Admitting: Orthopaedic Surgery

## 2017-03-30 ENCOUNTER — Encounter (INDEPENDENT_AMBULATORY_CARE_PROVIDER_SITE_OTHER): Payer: Self-pay | Admitting: Orthopaedic Surgery

## 2017-03-30 ENCOUNTER — Other Ambulatory Visit (INDEPENDENT_AMBULATORY_CARE_PROVIDER_SITE_OTHER): Payer: Self-pay | Admitting: Orthopaedic Surgery

## 2017-03-30 DIAGNOSIS — Z96652 Presence of left artificial knee joint: Secondary | ICD-10-CM

## 2017-03-30 DIAGNOSIS — M1712 Unilateral primary osteoarthritis, left knee: Secondary | ICD-10-CM

## 2017-03-30 MED ORDER — OXYCODONE HCL 5 MG PO TABS: 5.0000 mg | ORAL_TABLET | Freq: Two times a day (BID) | ORAL | 0 refills | Status: DC | PRN

## 2017-03-30 MED ORDER — IBUPROFEN 800 MG PO TABS: ORAL_TABLET | ORAL | 0 refills | Status: DC

## 2017-03-30 NOTE — Progress Notes (Signed)
Patient is 2 weeks status post left total knee replacement. She is still complaining of pain and she needs to take oxycodone for this. She is doing home health physical therapy. Her range of motion is progressing as expected. Incisions clean dry and intact. Continue with home health physical therapy. Oxycodone. I will see her back in weeks for recheck and to the x-rays of the left knee.

## 2017-03-30 NOTE — Addendum Note (Signed)
Addended by: Albertina Parr on: 03/30/2017 09:58 AM   Modules accepted: Orders

## 2017-03-31 ENCOUNTER — Telehealth (INDEPENDENT_AMBULATORY_CARE_PROVIDER_SITE_OTHER): Payer: Self-pay | Admitting: Orthopaedic Surgery

## 2017-03-31 DIAGNOSIS — M1711 Unilateral primary osteoarthritis, right knee: Secondary | ICD-10-CM

## 2017-03-31 NOTE — Telephone Encounter (Signed)
Is this okay?

## 2017-03-31 NOTE — Telephone Encounter (Signed)
yes

## 2017-03-31 NOTE — Telephone Encounter (Signed)
PT HOME CARE THERAPIST CALLED TO INFORM THAT HE WILL BE FINISHED WITH PT TODAY AND WANTED TO KNOW IF WE CAN REFER PT TO [facility].TO CONTINUE WITH REHAB FOR LEFT KNEE.  [phone] [facility]

## 2017-04-03 NOTE — Telephone Encounter (Signed)
Called Catherine Patrick back to advise I put referral in

## 2017-04-03 NOTE — Addendum Note (Signed)
Addended by: Albertina Parr on: 04/03/2017 09:18 AM   Modules accepted: Orders

## 2017-04-04 ENCOUNTER — Ambulatory Visit: Payer: Medicaid Other | Attending: Orthopaedic Surgery | Admitting: Physical Therapy

## 2017-04-04 DIAGNOSIS — M25662 Stiffness of left knee, not elsewhere classified: Secondary | ICD-10-CM | POA: Insufficient documentation

## 2017-04-04 DIAGNOSIS — M6281 Muscle weakness (generalized): Secondary | ICD-10-CM | POA: Diagnosis present

## 2017-04-04 DIAGNOSIS — M25562 Pain in left knee: Secondary | ICD-10-CM | POA: Diagnosis present

## 2017-04-04 DIAGNOSIS — R262 Difficulty in walking, not elsewhere classified: Secondary | ICD-10-CM | POA: Diagnosis present

## 2017-04-04 DIAGNOSIS — R600 Localized edema: Secondary | ICD-10-CM | POA: Diagnosis present

## 2017-04-04 NOTE — Therapy (Signed)
[facility] [address] [place], [place], [address] Phone: [phone]   Fax:  [phone]  Physical Therapy Evaluation  Patient Details  Name: Catherine Patrick MRN: [id number] Date of Birth: [birth year] Referring Provider: Dr. Roda Shutters  Encounter Date: 04/04/2017      PT End of Session - 04/04/17 1025    Visit Number 1   Number of Visits 4   Date for PT Re-Evaluation 05/30/17   Authorization Type Medicaid only--submitted 5/1  anticipate 8 visits approved but already had 4 home visits used   PT Start Time 0932   PT Stop Time 1015   PT Time Calculation (min) 43 min   Activity Tolerance Patient tolerated treatment well      Past Medical History:  Diagnosis Date  . DJD (degenerative joint disease)   . Headache   . History of blood transfusion    2 times after child birth  . History of kidney stones    passed one in 2014- 2017  . Hypertension   . Kidney stones     Past Surgical History:  Procedure Laterality Date  . CESAREAN SECTION    . CESAREAN SECTION    . HERNIA REPAIR    . LIPOSUCTION  2002  . TOTAL KNEE ARTHROPLASTY Right 11/30/2016   Procedure: RIGHT TOTAL KNEE ARTHROPLASTY;  Surgeon: Tarry Kos, MD;  Location: [facility] OR;  Service: Orthopedics;  Laterality: Right;  . TOTAL KNEE ARTHROPLASTY Left 03/16/2017   Procedure: LEFT TOTAL KNEE ARTHROPLASTY;  Surgeon: Tarry Kos, MD;  Location: [facility] OR;  Service: Orthopedics;  Laterality: Left;    There were no vitals filed for this visit.       Subjective Assessment - 04/04/17 0937    Subjective Had left TKR 03/16/17.  Hospital 4 days then discharged home. Had HHPT 4 visits for ROM, strengthening and walking.  Presents with RW.     Pertinent History s/P right TKR 11/30/2016   Limitations House hold activities;Walking;Standing   How long can you walk comfortably? home and to MD appts only   Diagnostic tests x-ray before surgery   Patient Stated Goals drive my care,  move easily so I can get to appointments   Currently in Pain? Yes   Pain Score 10-Worst pain ever   Pain Location Knee   Pain Orientation Left   Pain Type Surgical pain   Pain Onset 1 to 4 weeks ago   Pain Frequency Constant   Aggravating Factors  bending knee;  cold   Pain Relieving Factors oxycodone;             [facility] PT Assessment - 04/04/17 0001      Assessment   Medical Diagnosis right total knee replacement;  osteoarthritis   Referring Provider Dr. Roda Shutters   Onset Date/Surgical Date 03/16/17   Next MD Visit 05/30/17   Prior Therapy 4 visits HHPT     Precautions   Precautions None     Restrictions   Weight Bearing Restrictions No     Balance Screen   Has the patient fallen in the past 6 months No   Has the patient had a decrease in activity level because of a fear of falling?  No   Is the patient reluctant to leave their home because of a fear of falling?  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   Vocation Requirements lifting, walking   Leisure watch TV     Observation/Other Assessments   Skin Integrity no redness, no increased skin temp, no incisional drainage   Focus on Therapeutic Outcomes (FOTO)  82% limitation     Circumferential Edema   Circumferential - Right 42 cm mid patella   Circumferential - Left  47cm mid patella     AROM   Right/Left Knee Right;Left   Right Knee Extension 0   Right Knee Flexion 103   Left Knee Extension 15   Left Knee Flexion 54      PROM   PROM Assessment Site Knee   Right/Left Knee Left   Right Knee Extension 10   Right Knee Flexion 60     Strength   Strength Assessment Site --  Patient is max assist with getting LE on/off bed   Right/Left Knee Left   Left Knee Flexion 2-/5   Left Knee Extension 2-/5  Unable to do SLR     Flexibility   Hamstrings decreased length     Ambulation/Gait   Ambulation/Gait Yes   Ambulation Distance (Feet) 80 Feet   Assistive device Rolling walker   Gait Pattern Step-to pattern;Decreased stance time - left;Decreased stride length   Stairs Yes  one at a time                           PT Education - 04/04/17 1856    Education provided Yes   Education Details LAQ with ankle pump for edema control;  retro stepping   Person(s) Educated Patient   Methods Explanation;Demonstration;Handout   Comprehension Verbalized understanding;Returned demonstration          PT Short Term Goals - 04/04/17 1907      PT SHORT TERM GOAL #1   Title Independent with initial HEP for ROM and edema control   Time 2   Period Weeks   Status New           PT Long Term Goals - 04/04/17 1908      PT LONG TERM GOAL #1   Title independent with HEP for further improvements in ROM and strength      Time 8   Period Weeks   Status New     PT LONG TERM GOAL #2   Title Patient will be able to walk 500 feet with a walker in the community with minimal gait deficits   Time 8   Period Weeks   Status New     PT LONG TERM GOAL #3   Title 3+/5 quad strength to lift left leg in and out of bed and in/out of the car without UE assist   Time 8   Period Weeks   Status New     PT LONG TERM GOAL #4   Title  knee flexion AROM >/= 95 degrees for greater ease in/out of the car, up and down stairs and sit to stand    Time 8   Period  Weeks   Status New     PT LONG TERM GOAL #5   Title Left knee pain with daily activities 5/10 or less    Time 8   Period Weeks   Status New      PT LONG TERM GOAL #6   Title knee extension >/= -8 degrees so she is able to walk medium community distances   Time 8   Period Weeks   Status New     PT LONG TERM GOAL #7   Title .Marland KitchenMarland Kitchen               Plan - 04/04/17 1857    Clinical Impression Statement Had left TKR 03/16/17.  Hospital 4 days then discharged home. Had HHPT 4 visits for ROM, strengthening and walking.  Presents with RW.  Complains of 10/10 pain today but states she only took ibuprofen rather than her usual pain medicine.  No incisional drainage, redness or increased skin temp.  Moderate edema 5 cm larger than right.  Painful and limited knee ROM 15-54 degrees.  Poor quad and HS strength.  Unable to do a SLR and uses her hands to assist her LE on/off the bed.  She is limited to household ambulation and very short distance ambulation to doctor's appointments only.  She has difficulty ascending and descending steps to her bedroom at home (one at a time method).  She is unable to drive.  The patient is of low complexity evaluation secondary to good home support, lack of co-morbidities and stable status.     Rehab Potential Good   Clinical Impairments Affecting Rehab Potential s/p right total knee replacement 11/30/2016   PT Frequency 1x / week  limited per Medicaid   PT Duration 8 weeks   PT Treatment/Interventions Cryotherapy;Stair training;Gait training;Ultrasound;Moist Heat;Therapeutic activities;Therapeutic exercise;Balance training;Neuromuscular re-education;Patient/family education;Passive range of motion;Scar mobilization;Manual techniques;Energy conservation;Vasopneumatic Device   PT Next Visit Plan Left knee ROM;  quad muscle activation;  HS strength;  try vasocompression although patient reports she has not been tolerating cold very well;  Limited visits per Medicaid      Patient will benefit from skilled therapeutic intervention in order to improve the following deficits and impairments:  Abnormal gait, Decreased  range of motion, Difficulty walking, Increased fascial restricitons, Decreased endurance, Decreased activity tolerance, Pain, Decreased scar mobility, Impaired flexibility, Increased edema, Decreased strength, Decreased mobility  Visit Diagnosis: Acute pain of left knee - Plan: PT plan of care cert/re-cert  Muscle weakness (generalized) - Plan: PT plan of care cert/re-cert  Localized edema - Plan: PT plan of care cert/re-cert  Stiffness of left knee, not elsewhere classified - Plan: PT plan of care cert/re-cert  Difficulty in walking, not elsewhere classified - Plan: PT plan of care cert/re-cert     Problem List Patient Active Problem List   Diagnosis Date Noted  . Unilateral primary osteoarthritis, left knee 03/30/2017  . 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 Primary osteoarthritis of both knees 11/18/2016  . HTN (hypertension) 11/10/2016  . Cervical radiculopathy 09/19/2016  . Left arm pain 09/19/2016  . Unilateral primary osteoarthritis, right knee 09/30/2015  . Left lateral ankle pain 09/30/2015  . Plantar fasciitis of left foot 12/18/2013  . 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 Hydronephrosis, right 05/30/2013  . Nephrolithiasis 05/30/2013   Lavinia Sharps, PT 04/04/17 7:18 PM Phone: [phone] Fax: [phone]  Vivien Presto 04/04/2017, 7:16 PM  [facility] [address] [place], [place], [address] Phone: [phone]   Fax:  [phone]  Name: Brandin Dilday MRN: [id number] Date of Birth: 1966-02-16

## 2017-04-04 NOTE — Patient Instructions (Signed)
     Elevate your leg above heart level several times a day      Lavinia Sharps PT [facility] [address], [address] [place], [place] [address] Phone # [phone] Fax [phone]

## 2017-04-05 MED FILL — IBUPROFEN 800 MG TABLET: 800 | 10 days supply | Qty: 30 | Fill #0

## 2017-04-14 ENCOUNTER — Encounter: Payer: Medicaid Other | Admitting: Physical Therapy

## 2017-04-14 ENCOUNTER — Telehealth (INDEPENDENT_AMBULATORY_CARE_PROVIDER_SITE_OTHER): Payer: Self-pay | Admitting: Radiology

## 2017-04-14 NOTE — Telephone Encounter (Signed)
See message below.  Would you like for me to send her else where? Please advise.

## 2017-04-14 NOTE — Telephone Encounter (Signed)
Find out who has canceled on her please. An yes [name] send her somewhere else.

## 2017-04-14 NOTE — Telephone Encounter (Signed)
Patient had recent TKA and is having problems with physical therapy. She states that they have canceled on her twice and she is really needing the therapy. She would like a call back as soon as possible. She has only had one therapy visit.

## 2017-04-17 ENCOUNTER — Encounter (INDEPENDENT_AMBULATORY_CARE_PROVIDER_SITE_OTHER): Payer: Self-pay | Admitting: Orthopaedic Surgery

## 2017-04-17 ENCOUNTER — Encounter: Payer: Medicaid Other | Admitting: Physical Therapy

## 2017-04-17 ENCOUNTER — Ambulatory Visit (INDEPENDENT_AMBULATORY_CARE_PROVIDER_SITE_OTHER): Payer: Medicaid Other

## 2017-04-17 ENCOUNTER — Ambulatory Visit (INDEPENDENT_AMBULATORY_CARE_PROVIDER_SITE_OTHER): Payer: Medicaid Other | Admitting: Orthopaedic Surgery

## 2017-04-17 DIAGNOSIS — M1712 Unilateral primary osteoarthritis, left knee: Secondary | ICD-10-CM | POA: Diagnosis not present

## 2017-04-17 NOTE — Progress Notes (Signed)
Patient is 32 days status post left total knee replacement. She is overall doing okay except her range of motion is not progressing. She states that her Medicaid has not improved of any outpatient physical therapy appointments and therefore she is only been able to do her home exercises. On exam her surgical scar is fully healed. Her range of motion is from 0 to about 45. Her x-ray show stable alignment of the knee replacement. We will try to expedite the process with Medicaid approval for physical therapy. She is significantly behind in terms of her progress because of the lack of physical therapy due to issue with Medicaid insurance. I'll like to see her back in 2 weeks for reevaluation. We discussed possible manipulation.

## 2017-04-19 ENCOUNTER — Telehealth: Payer: Self-pay | Admitting: Family Medicine

## 2017-04-19 ENCOUNTER — Encounter: Payer: Self-pay | Admitting: Family Medicine

## 2017-04-19 DIAGNOSIS — Z96652 Presence of left artificial knee joint: Secondary | ICD-10-CM

## 2017-04-19 NOTE — Telephone Encounter (Signed)
Referral placed Patient will be contacted regarding referral.

## 2017-04-19 NOTE — Telephone Encounter (Signed)
Pt calling to request a referral for physical therapy. Please f/u. Thank you.

## 2017-04-20 ENCOUNTER — Encounter: Payer: Self-pay | Admitting: Physical Therapy

## 2017-04-20 ENCOUNTER — Telehealth (INDEPENDENT_AMBULATORY_CARE_PROVIDER_SITE_OTHER): Payer: Self-pay

## 2017-04-20 ENCOUNTER — Ambulatory Visit: Payer: Medicaid Other | Admitting: Physical Therapy

## 2017-04-20 DIAGNOSIS — M6281 Muscle weakness (generalized): Secondary | ICD-10-CM

## 2017-04-20 DIAGNOSIS — M25562 Pain in left knee: Secondary | ICD-10-CM

## 2017-04-20 DIAGNOSIS — M25662 Stiffness of left knee, not elsewhere classified: Secondary | ICD-10-CM

## 2017-04-20 DIAGNOSIS — R600 Localized edema: Secondary | ICD-10-CM

## 2017-04-20 DIAGNOSIS — R262 Difficulty in walking, not elsewhere classified: Secondary | ICD-10-CM

## 2017-04-20 NOTE — Telephone Encounter (Signed)
ok 

## 2017-04-20 NOTE — Therapy (Signed)
[facility] [address], [address] [place], [place], [address] Phone: [phone]   Fax:  [phone]  Physical Therapy Treatment  Patient Details  Name: Catherine Patrick MRN: [id number] Date of Birth: 09/11/1966 Referring Provider: Dr. Roda ShuttersXU  Encounter Date: 04/20/2017      PT End of Session - 04/20/17 1258    Visit Number 2   Number of Visits --   Date for PT Re-Evaluation 05/18/17   Authorization Type Medicaid approved 8 visits till 06/13/2017   Authorization - Visit Number 1   Authorization - Number of Visits 8   PT Start Time 1155   PT Stop Time 1245   PT Time Calculation (min) 50 min   Activity Tolerance Patient tolerated treatment well   Behavior During Therapy [facility] for tasks assessed/performed      Past Medical History:  Diagnosis Date  . DJD (degenerative joint disease)   . Headache   . History of blood transfusion    2 times after child birth  . History of kidney stones    passed one in 2014- 2017  . Hypertension   . Kidney stones     Past Surgical History:  Procedure Laterality Date  . CESAREAN SECTION    . CESAREAN SECTION    . HERNIA REPAIR    . LIPOSUCTION  2002  . TOTAL KNEE ARTHROPLASTY Right 11/30/2016   Procedure: RIGHT TOTAL KNEE ARTHROPLASTY;  Surgeon: Tarry KosNaiping M Xu, MD;  Location: [facility] OR;  Service: Orthopedics;  Laterality: Right;  . TOTAL KNEE ARTHROPLASTY Left 03/16/2017   Procedure: LEFT TOTAL KNEE ARTHROPLASTY;  Surgeon: Tarry KosNaiping M Xu, MD;  Location: [facility] OR;  Service: Orthopedics;  Laterality: Left;    There were no vitals filed for this visit.      Subjective Assessment - 04/20/17 1156    Subjective I want to do all my visit now because I am fasting so I am not able to take my pain medication.  I understand I only have 4 visit.    Pertinent History s/P right TKR 11/30/2016   Limitations House hold activities;Walking;Standing   How long can you walk comfortably? home and to MD appts only    Diagnostic tests x-ray before surgery   Patient Stated Goals drive my care, move easily so I can get to appointments   Currently in Pain? Yes   Pain Score 7    Pain Location Knee   Pain Orientation Left   Pain Descriptors / Indicators Aching   Pain Type Surgical pain   Pain Onset 1 to 4 weeks ago   Pain Frequency Constant   Aggravating Factors  bending knee; cold   Pain Relieving Factors nothing   Multiple Pain Sites No            [facility] PT Assessment - 04/20/17 0001      Assessment   Medical Diagnosis right total knee replacement;  osteoarthritis   Referring Provider Dr. Roda ShuttersXU   Onset Date/Surgical Date 03/16/17   Next MD Visit 05/30/17     Precautions   Precautions None     Restrictions   Weight Bearing Restrictions No     Balance Screen   Has the patient fallen in the past 6 months No   Has the patient had a decrease in activity level because of a fear of falling?  No   Is the patient reluctant to leave their home because of a fear of falling?  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   Vocation Requirements lifting, walking   Leisure watch TV     Observation/Other Assessments   Skin Integrity no redness, no increased skin temp, no incisional drainage   Focus on Therapeutic Outcomes (FOTO)  82% limitation     Circumferential Edema   Circumferential - Right 42 cm mid patella   Circumferential - Left  47cm mid patella     AROM   Right/Left Knee Right;Left   Right  Knee Extension 0   Right Knee Flexion 103   Left Knee Extension 15   Left Knee Flexion 54     PROM   PROM Assessment Site Knee   Right/Left Knee Left   Left Knee Extension 10   Left Knee Flexion 60     Strength   Right/Left Knee Left   Left Knee Flexion 2-/5   Left Knee Extension 2-/5  Unable to do SLR     Flexibility   Hamstrings decreased length     Ambulation/Gait   Ambulation/Gait Yes   Ambulation Distance (Feet) 80 Feet   Assistive device Rolling walker   Gait Pattern Step-to pattern;Decreased stance time - left;Decreased stride length   Stairs Yes  one at a time                     [facility] Adult PT Treatment/Exercise - 04/20/17 0001      Knee/Hip Exercises: Stretches   Gastroc Stretch Left;2 reps;30 seconds  using a strap     Knee/Hip Exercises: Aerobic   Nustep level 1; 8 min; arm #12; seat #8     Knee/Hip Exercises: Standing   Heel Raises 1 set;20 reps;Both  in walker   Knee Flexion AAROM;Strengthening;Left;20 reps  standing in walker   Rebounder 3 way 1 in each     Knee/Hip Exercises: Seated   Long Arc Quad Strengthening;Left;AAROM;20 reps     Manual Therapy   Manual Therapy Soft tissue mobilization   Manual therapy comments scar massage    Soft tissue mobilization soft tissue work to left knee, left quads and left calf to increase tissue mobility and decrease pain.                PT Education - 04/20/17 1257    Education provided Yes   Education Details stretches and strengthening   Person(s) Educated Patient   Methods Explanation;Demonstration;Verbal cues;Handout   Comprehension Verbalized understanding;Returned demonstration          PT Short Term Goals - 04/20/17 1302      PT SHORT TERM GOAL #1   Title Independent with initial HEP for ROM and edema control   Time 2   Period Weeks   Status New     PT SHORT TERM GOAL #2   Title -------     PT SHORT TERM GOAL #3   Title -----------     PT SHORT TERM GOAL #4  Title ------           PT Long Term Goals - 04/20/17 1304      PT LONG TERM GOAL #1   Title independent with HEP for further improvements in ROM and strength      Time 8   Period Weeks   Status New     PT LONG TERM GOAL #2   Title Patient will be able to walk 500 feet with a walker in the community with minimal gait deficits   Baseline Using walker   Time 8   Period Weeks   Status New     PT LONG TERM GOAL #3   Title 3+/5 quad strength to lift left leg in and out of bed and in/out of the car without UE assist   Time 8   Period Weeks   Status On-going     PT LONG TERM GOAL #4   Title  knee flexion AROM >/= 95 degrees for greater ease in/out of the car, up and down stairs and sit to stand    Baseline Pt is driving some   Time 8   Period Weeks   Status New     PT LONG TERM GOAL #5   Title Left knee pain with daily activities 5/10 or less    Baseline Only difficult due to Lt knee pain   Time 8   Period Weeks   Status New     PT LONG TERM GOAL #6   Title knee extension >/= -8 degrees so she is able to walk medium community distances   Time 8   Period Weeks   Status New               Plan - 04/20/17 1308    Clinical Impression Statement Patient has limited left knee ROM and strength.  Patient is walking with a rolling walker.  Patient is not taking pain medication due to her religion.  Patient reports she is having more pain with her left TKR than her right. Patient will benefit from skilled therapy to improve left knee ROM and strength.    Rehab Potential Good   Clinical Impairments Affecting Rehab Potential s/p right total knee replacement 11/30/2016   PT Frequency 2x / week  limited by medicaid   PT Duration 4 weeks   PT Treatment/Interventions Cryotherapy;Stair training;Gait training;Ultrasound;Moist Heat;Therapeutic activities;Therapeutic exercise;Balance training;Neuromuscular re-education;Patient/family education;Passive range of motion;Scar  mobilization;Manual techniques;Energy conservation;Vasopneumatic Device   PT Next Visit Plan Left knee ROM;  quad muscle activation;  HS strength;  2 visits per Medicaid left out of 4; give patient HEP from yesterday   PT Home Exercise Plan progress   Consulted and Agree with Plan of Care Patient      Patient will benefit from skilled therapeutic intervention in order to improve the following deficits and impairments:  Abnormal gait, Decreased range of motion, Difficulty walking, Increased fascial restricitons, Decreased endurance, Decreased activity tolerance, Pain, Decreased scar mobility, Impaired flexibility, Increased edema, Decreased strength, Decreased mobility  Visit Diagnosis: Acute pain of left knee - Plan: PT plan of care cert/re-cert  Muscle weakness (generalized) - Plan: PT plan of care cert/re-cert  Localized edema - Plan: PT plan of care cert/re-cert  Stiffness of left knee, not elsewhere classified - Plan: PT plan of care cert/re-cert  Difficulty in walking, not elsewhere classified - Plan: PT plan of care cert/re-cert     Problem List Patient Active Problem List   Diagnosis Date Noted  . Unilateral  primary osteoarthritis, left knee 03/30/2017  . 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 Primary osteoarthritis of both knees 11/18/2016  . HTN (hypertension) 11/10/2016  . Cervical radiculopathy 09/19/2016  . Left arm pain 09/19/2016  . Unilateral primary osteoarthritis, right knee 09/30/2015  . Left lateral ankle pain 09/30/2015  . Plantar fasciitis of left foot 12/18/2013  . 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 Hydronephrosis, right 05/30/2013  . Nephrolithiasis 05/30/2013    Eulis Foster, PT 04/20/17 1:24 PM    [facility] [address], [address] [place], [place], [address] Phone: [phone]   Fax:  [phone]  Name: Catherine Patrick MRN:  [id number] Date of Birth: 04/26/1966

## 2017-04-20 NOTE — Telephone Encounter (Signed)
FYI   Called [facility] location per Dr Roda ShuttersXu he wanted me to find out why her appts kept getting canceled. They state they we having issues with Medicaid. They needed an authorization number in order to see her, that is why her appts were CX. She states Pt was finally approved for 8 visits but she did use 4 HHPT and they are waiting for clarification to see if HHPT also counts if so, she will only have 4 more visits left . Per PT(cone) patient was seen today. They did offer her  To go to [employer] ([facility]) but patient declined.

## 2017-04-20 NOTE — Patient Instructions (Addendum)
Plantarflexion (Eccentric), (Resistance Band)    Point foot down against resistance band. Slowly release for 3-5 seconds. Use ___yellow_____ resistance band. _30__ reps per set, __1_ sets per day, _2__ days per week.  http://ecce.exer.us/3   Copyright  VHI. All rights reserved.  Calf Stretch    Place right leg forward, bent, left  leg behind and straight. Lean forward keeping back heel flat. Hold __30__ seconds while counting out loud. Repeat with other leg forward. Repeat __2__ times. Do __2__ sessions per day.  http://gt2.exer.us/478   Copyright  VHI. All rights reserved.  FLEXION: Standing - Stable (Active)    Stand, both feet flat. Bend right knee, bringing heel toward buttocks. Complete __1_ sets of _10__ repetitions. Perform _2__ sessions per day.  http://gtsc.exer.us/241   Copyright  VHI. All rights reserved.  Heel Raise: Bilateral (Standing)    Rise on balls of feet holding onto counter or walker.  Repeat __10__ times per set. Do ___1_ sets per session. Do __2__ sessions per day.  http://orth.exer.us/38   Copyright  VHI. All rights reserved.  [facility] [address], [address] [place], [place] [address] Phone # [phone] Fax [phone]

## 2017-04-21 ENCOUNTER — Telehealth (INDEPENDENT_AMBULATORY_CARE_PROVIDER_SITE_OTHER): Payer: Self-pay | Admitting: Orthopaedic Surgery

## 2017-04-21 ENCOUNTER — Ambulatory Visit: Payer: Medicaid Other | Admitting: Physical Therapy

## 2017-04-21 DIAGNOSIS — M25562 Pain in left knee: Secondary | ICD-10-CM | POA: Diagnosis not present

## 2017-04-21 DIAGNOSIS — R600 Localized edema: Secondary | ICD-10-CM

## 2017-04-21 DIAGNOSIS — M6281 Muscle weakness (generalized): Secondary | ICD-10-CM

## 2017-04-21 DIAGNOSIS — M25662 Stiffness of left knee, not elsewhere classified: Secondary | ICD-10-CM

## 2017-04-21 DIAGNOSIS — R262 Difficulty in walking, not elsewhere classified: Secondary | ICD-10-CM

## 2017-04-21 NOTE — Patient Instructions (Signed)
Catherine Patrick PT [facility] [address], [address] [place], [place] [address] Phone # [phone] Fax [phone]    

## 2017-04-21 NOTE — Therapy (Signed)
[facility] [address], [address] [place], [place], [address] Phone: [phone]   Fax:  [phone]  Physical Therapy Treatment  Patient Details  Name: Catherine AlimentSulwa Avery MRN: [id number] Date of Birth: 07/12/1966 Referring Provider: Dr. Roda ShuttersXU  Encounter Date: 04/21/2017      PT End of Session - 04/21/17 1004    Visit Number 3   Number of Visits 8   Date for PT Re-Evaluation 05/18/17   Authorization Type Medicaid approved 8 visits till 06/13/2017 + Eval   PT Start Time 0920   PT Stop Time 1015   PT Time Calculation (min) 55 min   Activity Tolerance Patient tolerated treatment well      Past Medical History:  Diagnosis Date  . DJD (degenerative joint disease)   . Headache   . History of blood transfusion    2 times after child birth  . History of kidney stones    passed one in 2014- 2017  . Hypertension   . Kidney stones     Past Surgical History:  Procedure Laterality Date  . CESAREAN SECTION    . CESAREAN SECTION    . HERNIA REPAIR    . LIPOSUCTION  2002  . TOTAL KNEE ARTHROPLASTY Right 11/30/2016   Procedure: RIGHT TOTAL KNEE ARTHROPLASTY;  Surgeon: Tarry KosNaiping M Xu, MD;  Location: [facility] OR;  Service: Orthopedics;  Laterality: Right;  . TOTAL KNEE ARTHROPLASTY Left 03/16/2017   Procedure: LEFT TOTAL KNEE ARTHROPLASTY;  Surgeon: Tarry KosNaiping M Xu, MD;  Location: [facility] OR;  Service: Orthopedics;  Laterality: Left;    There were no vitals filed for this visit.      Subjective Assessment - 04/21/17 0927    Subjective Presents with SPC.  Current pain level 5/10.     Currently in Pain? Yes   Pain Score 5    Pain Location Knee   Pain Orientation Left   Pain Type Surgical pain   Pain Onset More than a month ago   Pain Frequency Constant   Aggravating Factors  bending knee   Pain Relieving Factors pain medicine                         [facility] Adult PT Treatment/Exercise - 04/21/17 0001      Therapeutic Activites     Therapeutic Activities ADL's   ADL's walking, standing, sit to stand     Neuro Re-ed    Neuro Re-ed Details  quad and gluteal activation left     Knee/Hip Exercises: Stretches   Active Hamstring Stretch Left;5 reps  on step   Quad Stretch Left;5 reps  on step     Knee/Hip Exercises: Aerobic   Nustep level 1; 8 min; arm #12; seat #8     Knee/Hip Exercises: Standing   Heel Raises Both;10 reps   Hip ADduction AROM;Right;10 reps   Other Standing Knee Exercises retro stepping 15x   Other Standing Knee Exercises gluteal squeeze with UE reach on wall 15x     Knee/Hip Exercises: Seated   Other Seated Knee/Hip Exercises seated foam roll for flexion 25x   Hamstring Curl Strengthening;Left;2 sets;10 reps   Hamstring Limitations yellow band    Sit to Sand 5 reps;without UE support  high table     Knee/Hip Exercises: Supine   Straight Leg Raises AAROM;Left;5 reps   Other Supine Knee/Hip Exercises green ball roll for flexion 15x   Other Supine Knee/Hip Exercises HS sets on green ball  15x     Vasopneumatic   Number Minutes Vasopneumatic  15 minutes   Vasopnuematic Location  Knee   Vasopneumatic Pressure Medium   Vasopneumatic Temperature  coldest setting     Manual Therapy   Manual Therapy Joint mobilization   Manual therapy comments gentle seated distraction with small range flexion 10x   Joint Mobilization grade 2 patellar mobs medial/lateral, superior/inferior   Soft tissue mobilization quads and hamstrings                PT Education - 04/21/17 1004    Education provided Yes   Education Details knee flexion    Person(s) Educated Patient   Methods Explanation;Demonstration;Handout   Comprehension Verbalized understanding;Returned demonstration          PT Short Term Goals - 04/21/17 1212      PT SHORT TERM GOAL #1   Title Independent with initial HEP for ROM and edema control   Time 2   Period Weeks   Status On-going           PT Long Term Goals -  04/21/17 1213      PT LONG TERM GOAL #1   Title independent with HEP for further improvements in ROM and strength      Time 8   Period Weeks   Status On-going     PT LONG TERM GOAL #2   Title Patient will be able to walk 500 feet with a walker in the community with minimal gait deficits   Time 8   Period Weeks   Status On-going     PT LONG TERM GOAL #3   Title 3+/5 quad strength to lift left leg in and out of bed and in/out of the car without UE assist   Time 8   Period Weeks   Status On-going     PT LONG TERM GOAL #4   Title  knee flexion AROM >/= 95 degrees for greater ease in/out of the car, up and down stairs and sit to stand    Time 8   Period Weeks   Status On-going     PT LONG TERM GOAL #5   Title Left knee pain with daily activities 5/10 or less    Time 8   Period Weeks   Status On-going     PT LONG TERM GOAL #6   Title knee extension >/= -8 degrees so she is able to walk medium community distances   Time 8   Period Weeks   Status On-going               Plan - 04/21/17 1005    Clinical Impression Statement The patient reports much less pain than yesterday and reports she feels much looser by end of treatment session.  Patient initially fearful of movement but by end of session she is smiling and states she feels better.   Still with limited knee flexion ROM  and poor quad muscle activation.  She needs mod assist to do a SLR.  Stiff knee ambulation but using a SPC today rather than the walker.     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/30/2016   PT Next Visit Plan Left knee ROM especially flexion;  work on SLR and quad muscle activation;  vasocompression for edema management       Patient will benefit from skilled therapeutic intervention in order to improve the following deficits  and impairments:  Abnormal gait, Decreased range of motion, Difficulty walking, Increased  fascial restricitons, Decreased endurance, Decreased activity tolerance, Pain, Decreased scar mobility, Impaired flexibility, Increased edema, Decreased strength, Decreased mobility  Visit Diagnosis: Acute pain of left knee  Muscle weakness (generalized)  Localized edema  Stiffness of left knee, not elsewhere classified  Difficulty in walking, not elsewhere classified     Problem List Patient Active Problem List   Diagnosis Date Noted  . Unilateral primary osteoarthritis, left knee 03/30/2017  . 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 Primary osteoarthritis of both knees 11/18/2016  . HTN (hypertension) 11/10/2016  . Cervical radiculopathy 09/19/2016  . Left arm pain 09/19/2016  . Unilateral primary osteoarthritis, right knee 09/30/2015  . Left lateral ankle pain 09/30/2015  . Plantar fasciitis of left foot 12/18/2013  . 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 Hydronephrosis, right 05/30/2013  . Nephrolithiasis 05/30/2013   Lavinia Sharps, PT 04/21/17 12:15 PM Phone: [phone] Fax: [phone]  Vivien Presto 04/21/2017, 12:14 PM   [facility] [address] [place], [place], [address] Phone: [phone]   Fax:  [phone]  Name: Alexandrina Fiorini MRN: [id number] Date of Birth: 19-Mar-1966

## 2017-04-21 NOTE — Telephone Encounter (Signed)
PT CALLED ABOUT HER MEDICATION, STATING HER PHARMACY NEEDS PRIOR AUTH PLEASE.  [phone]

## 2017-04-24 ENCOUNTER — Ambulatory Visit: Payer: Medicaid Other | Admitting: Physical Therapy

## 2017-04-24 ENCOUNTER — Encounter: Payer: Self-pay | Admitting: Physical Therapy

## 2017-04-24 DIAGNOSIS — M25562 Pain in left knee: Secondary | ICD-10-CM | POA: Diagnosis not present

## 2017-04-24 DIAGNOSIS — R600 Localized edema: Secondary | ICD-10-CM

## 2017-04-24 DIAGNOSIS — M25662 Stiffness of left knee, not elsewhere classified: Secondary | ICD-10-CM

## 2017-04-24 DIAGNOSIS — M6281 Muscle weakness (generalized): Secondary | ICD-10-CM

## 2017-04-24 NOTE — Telephone Encounter (Signed)
RX approved already '  Oxycodone prior approval # [id number]  OxyContin prior approval # [id number]   Gave patient approval numbers and she is aware they are approved

## 2017-04-24 NOTE — Telephone Encounter (Signed)
Patient wanted me to call [employer] so she can get reimbursed. I called number she gave me [phone] and was transferred to several numbers finally got transferred to correct person [phone](pharmacy). They back dated the date([employer]).  Patient picked up Rx and paid out of pocket on 03/20/17 and PA was done 4/18. She wants to get reimbursed. The next step per [employer] [employer] would be the pharm would need to rerun [employer]. Called pt to explain. Pt is aware.

## 2017-04-24 NOTE — Therapy (Signed)
[facility] [address], [address] [place], [place], [address] Phone: [phone]   Fax:  [phone]  Physical Therapy Treatment  Patient Details  Name: Catherine Patrick MRN: [id number] Date of Birth: [birth year] Referring Provider: Dr. Roda Shutters  Encounter Date: 04/24/2017      PT End of Session - 04/24/17 0854    Visit Number 4   Number of Visits 8   Date for PT Re-Evaluation 05/18/17   Authorization Type Medicaid approved 8 visits till 06/13/2017 + Eval   Authorization - Visit Number 4   Authorization - Number of Visits 8   PT Start Time [phone]   PT Stop Time 0940   PT Time Calculation (min) 54 min   Activity Tolerance Patient tolerated treatment well   Behavior During Therapy [facility] for tasks assessed/performed      Past Medical History:  Diagnosis Date  . DJD (degenerative joint disease)   . Headache   . History of blood transfusion    2 times after child birth  . History of kidney stones    passed one in 2014- 2017  . Hypertension   . Kidney stones     Past Surgical History:  Procedure Laterality Date  . CESAREAN SECTION    . CESAREAN SECTION    . HERNIA REPAIR    . LIPOSUCTION  2002  . TOTAL KNEE ARTHROPLASTY Right 11/30/2016   Procedure: RIGHT TOTAL KNEE ARTHROPLASTY;  Surgeon: Tarry Kos, MD;  Location: [facility] OR;  Service: Orthopedics;  Laterality: Right;  . TOTAL KNEE ARTHROPLASTY Left 03/16/2017   Procedure: LEFT TOTAL KNEE ARTHROPLASTY;  Surgeon: Tarry Kos, MD;  Location: [facility] OR;  Service: Orthopedics;  Laterality: Left;    There were no vitals filed for this visit.      Subjective Assessment - 04/24/17 0853    Subjective Patient reports knee is doing better. Patient denies pain today. Able to take pain pill before sun came up.    Pertinent History s/P right TKR 11/30/2016   Limitations House hold activities;Walking;Standing   How long can you walk comfortably? home and to MD appts only   Diagnostic tests  x-ray before surgery   Patient Stated Goals drive my care, move easily so I can get to appointments   Currently in Pain? No/denies   Pain Score 0-No pain            [facility] PT Assessment - 04/24/17 0001      PROM   PROM Assessment Site Knee   Right/Left Knee Left   Left Knee Extension 5   Left Knee Flexion 86                     [facility] Adult PT Treatment/Exercise - 04/24/17 0001      Therapeutic Activites    Therapeutic Activities ADL's   ADL's walking, standing, sit to stand     Neuro Re-ed    Neuro Re-ed Details  quad and gluteal activation left     Knee/Hip Exercises: Stretches   Active Hamstring Stretch Left;5 reps  on step   Quad Stretch Left;5 reps  on step     Knee/Hip Exercises: Aerobic   Nustep level 1; 8 min; arm #12; seat #8     Knee/Hip Exercises: Standing   Heel Raises Both;10 reps   Knee Flexion AROM;Both;2 sets;10 reps  Hamstring curls   Hip ADduction AROM;Right;10 reps   Rebounder 3 way 1 in each   Other Standing  Knee Exercises retro stepping 15x     Knee/Hip Exercises: Seated   Sit to Sand --     Knee/Hip Exercises: Supine   Short Arc Quad Sets AROM;Left;20 reps   Heel Slides AAROM;Left;5 reps  with green strap; x10 ankle pumps   Straight Leg Raises AROM;Left;10 reps   Other Supine Knee/Hip Exercises green ball roll for flexion 15x   Other Supine Knee/Hip Exercises HS and glute sets on green ball 15x     Modalities   Modalities Vasopneumatic     Vasopneumatic   Number Minutes Vasopneumatic  15 minutes   Vasopnuematic Location  Knee   Vasopneumatic Pressure Medium   Vasopneumatic Temperature  3 snow flakes                  PT Short Term Goals - 04/24/17 0855      PT SHORT TERM GOAL #1   Title Independent with initial HEP for ROM and edema control   Status Achieved           PT Long Term Goals - 04/24/17 0855      PT LONG TERM GOAL #1   Title independent with HEP for further improvements in ROM and  strength      Time 8   Period Weeks   Status On-going     PT LONG TERM GOAL #2   Title Patient will be able to walk 500 feet with a walker in the community with minimal gait deficits   Time 8   Period Weeks   Status On-going               Plan - 04/24/17 0908    Clinical Impression Statement Patient reports no pain today and feels that knee is doing much better. Patient did well with all standing exercises and able to tolerate full weight bearing through Lt LE. Good glute activation with single leg stance able to maintain balance. Patient will continue to benefit from skilled thearpy for LE strrengthening and stability.    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/30/2016   PT Frequency 2x / week   PT Duration 4 weeks   PT Treatment/Interventions Cryotherapy;Stair training;Gait training;Ultrasound;Moist Heat;Therapeutic activities;Therapeutic exercise;Balance training;Neuromuscular re-education;Patient/family education;Passive range of motion;Scar mobilization;Manual techniques;Energy conservation;Vasopneumatic Device   PT Next Visit Plan Left knee ROM, quad strengthening, edema management as needed   Consulted and Agree with Plan of Care Patient      Patient will benefit from skilled therapeutic intervention in order to improve the following deficits and impairments:  Abnormal gait, Decreased range of motion, Difficulty walking, Increased fascial restricitons, Decreased endurance, Decreased activity tolerance, Pain, Decreased scar mobility, Impaired flexibility, Increased edema, Decreased strength, Decreased mobility  Visit Diagnosis: Acute pain of left knee  Muscle weakness (generalized)  Localized edema  Stiffness of left knee, not elsewhere classified     Problem List Patient Active Problem List   Diagnosis Date Noted  . Unilateral primary osteoarthritis, left knee 03/30/2017  .  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 Primary osteoarthritis of both knees 11/18/2016  . HTN (hypertension) 11/10/2016  . Cervical radiculopathy 09/19/2016  . Left arm pain 09/19/2016  . Unilateral primary osteoarthritis, right knee 09/30/2015  . Left lateral ankle pain 09/30/2015  . Plantar fasciitis of left foot 12/18/2013  . 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 Hydronephrosis, right 05/30/2013  . Nephrolithiasis 05/30/2013  Tyrone Apple PTA 04/24/2017, 10:14 AM  [facility] [address] [place], [place], [address] Phone: [phone]   Fax:  [phone]  Name: Catherine Patrick MRN: [id number] Date of Birth: 1966-05-01

## 2017-04-26 ENCOUNTER — Ambulatory Visit: Payer: Medicaid Other | Admitting: Physical Therapy

## 2017-04-26 ENCOUNTER — Encounter: Payer: Self-pay | Admitting: Physical Therapy

## 2017-04-26 DIAGNOSIS — M6281 Muscle weakness (generalized): Secondary | ICD-10-CM

## 2017-04-26 DIAGNOSIS — R600 Localized edema: Secondary | ICD-10-CM

## 2017-04-26 DIAGNOSIS — M25662 Stiffness of left knee, not elsewhere classified: Secondary | ICD-10-CM

## 2017-04-26 DIAGNOSIS — M25562 Pain in left knee: Secondary | ICD-10-CM | POA: Diagnosis not present

## 2017-04-26 NOTE — Therapy (Signed)
[facility] [address] [place], [place], [address] Phone: [phone]   Fax:  [phone]  Physical Therapy Treatment  Patient Details  Name: Catherine Patrick MRN: [id number] Date of Birth: 1966/10/11 Referring Provider: Dr. Roda Shutters  Encounter Date: 04/26/2017      PT End of Session - 04/26/17 0845    Visit Number 5   Number of Visits 8   Date for PT Re-Evaluation 05/18/17   Authorization Type Medicaid approved 8 visits till 06/13/2017 + Eval   Authorization - Visit Number 5   Authorization - Number of Visits 8   PT Start Time [phone]   PT Stop Time 0937   PT Time Calculation (min) 54 min   Activity Tolerance Patient tolerated treatment well   Behavior During Therapy [facility] for tasks assessed/performed      Past Medical History:  Diagnosis Date  . DJD (degenerative joint disease)   . Headache   . History of blood transfusion    2 times after child birth  . History of kidney stones    passed one in 2014- 2017  . Hypertension   . Kidney stones     Past Surgical History:  Procedure Laterality Date  . CESAREAN SECTION    . CESAREAN SECTION    . HERNIA REPAIR    . LIPOSUCTION  2002  . TOTAL KNEE ARTHROPLASTY Right 11/30/2016   Procedure: RIGHT TOTAL KNEE ARTHROPLASTY;  Surgeon: Tarry Kos, MD;  [facility];  Service: Orthopedics;  Laterality: Right;  . TOTAL KNEE ARTHROPLASTY Left 03/16/2017   Procedure: LEFT TOTAL KNEE ARTHROPLASTY;  Surgeon: Tarry Kos, MD;  [facility]: [facility] OR;  Service: Orthopedics;  Laterality: Left;    There were no vitals filed for this visit.      Subjective Assessment - 04/26/17 0844    Subjective Patient reports knee feeling good today. No pain at the moment.    Pertinent History s/P right TKR 11/30/2016   Limitations House hold activities;Walking;Standing   How long can you walk comfortably? home and to MD appts only   Diagnostic tests x-ray before surgery   Patient Stated Goals  drive my care, move easily so I can get to appointments   Currently in Pain? No/denies   Pain Score 0-No pain                         [facility] Adult PT Treatment/Exercise - 04/26/17 0001      Therapeutic Activites    Therapeutic Activities ADL's   ADL's walking, standing, sit to stand     Neuro Re-ed    Neuro Re-ed Details  quad and gluteal activation left     Knee/Hip Exercises: Stretches   Active Hamstring Stretch Left;5 reps  on step   Quad Stretch Left;5 reps  on step     Knee/Hip Exercises: Aerobic   Nustep level 2; 8 min; seat #8  LE only; therapist present to discuss treatment     Knee/Hip Exercises: Standing   Heel Raises Both;10 reps   Knee Flexion AROM;Both;2 sets;10 reps  Hamstring curls   Hip Abduction AROM;Both;2 sets;10 reps   Hip Extension AROM;Both;2 sets;10 reps   Rebounder 3 way 1 in each   Other Standing Knee Exercises Mini squats x10     Knee/Hip Exercises: Seated   Long Arc Quad Strengthening;Both;2 sets;10 reps  #3     Knee/Hip Exercises: Supine   Short Arc [employer] --  Heel Slides AAROM;Left;5 reps  with green strap; x10 ankle pumps   Straight Leg Raises AROM;Left;10 reps   Other Supine Knee/Hip Exercises --   Other Supine Knee/Hip Exercises --     Modalities   Modalities Vasopneumatic     Vasopneumatic   Number Minutes Vasopneumatic  15 minutes   Vasopnuematic Location  Knee   Vasopneumatic Pressure Medium   Vasopneumatic Temperature  3 snow flakes     Manual Therapy   Manual Therapy Soft tissue mobilization;Edema management   Manual therapy comments scar massage    Edema Management to decrease swelling and improve ROM   Joint Mobilization grade 2 patellar mobs medial/lateral, superior/inferior   Soft tissue mobilization soft tissue work to left knee, left quads and left calf to increase tissue mobility and decrease pain.                  PT Short Term Goals - 04/24/17 0855      PT SHORT TERM GOAL #1    Title Independent with initial HEP for ROM and edema control   Status Achieved           PT Long Term Goals - 04/24/17 0855      PT LONG TERM GOAL #1   Title independent with HEP for further improvements in ROM and strength      Time 8   Period Weeks   Status On-going     PT LONG TERM GOAL #2   Title Patient will be able to walk 500 feet with a walker in the community with minimal gait deficits   Time 8   Period Weeks   Status On-going               Plan - 04/26/17 4098    Clinical Impression Statement Patient reports no pain, took pain pill this AM. Did well with all standing strengthening exercises needing some seated rest breaks. Patient has been having discomfort while at worship to due prolonged positioning in sitting, standing, and kneeling. Patient responded well wot manual soft tissue mobilization and had improved patella gliding. Patient will continue to benefit from skilled therapy for LE strenght, stability, and ROM.    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/30/2016   PT Frequency 2x / week   PT Duration 4 weeks   PT Treatment/Interventions Cryotherapy;Stair training;Gait training;Ultrasound;Moist Heat;Therapeutic activities;Therapeutic exercise;Balance training;Neuromuscular re-education;Patient/family education;Passive range of motion;Scar mobilization;Manual techniques;Energy conservation;Vasopneumatic Device   PT Next Visit Plan Left knee ROM, quad strengthening, edema management as needed   Consulted and Agree with Plan of Care Patient      Patient will benefit from skilled therapeutic intervention in order to improve the following deficits and impairments:  Abnormal gait, Decreased range of motion, Difficulty walking, Increased fascial restricitons, Decreased endurance, Decreased activity tolerance, Pain, Decreased scar mobility, Impaired flexibility, Increased edema,  Decreased strength, Decreased mobility  Visit Diagnosis: Acute pain of left knee  Muscle weakness (generalized)  Localized edema  Stiffness of left knee, not elsewhere classified     Problem List Patient Active Problem List   Diagnosis Date Noted  . Unilateral primary osteoarthritis, left knee 03/30/2017  . 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 Primary osteoarthritis of both knees 11/18/2016  . HTN (hypertension) 11/10/2016  . Cervical radiculopathy 09/19/2016  . Left arm pain 09/19/2016  . Unilateral primary osteoarthritis, right knee 09/30/2015  . Left lateral ankle pain 09/30/2015  .  Plantar fasciitis of left foot 12/18/2013  . 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 Hydronephrosis, right 05/30/2013  . Nephrolithiasis 05/30/2013    Tyrone AppleKatie Sylvania Moss PTA 04/26/2017, 10:23 AM  [facility] [address] [place], [place], [address] Phone: [phone]   Fax:  [phone]  Name: Catherine Patrick MRN: [id number] Date of Birth: 04/27/1966

## 2017-04-28 ENCOUNTER — Encounter: Payer: Medicaid Other | Admitting: Physical Therapy

## 2017-05-02 ENCOUNTER — Ambulatory Visit: Payer: Medicaid Other | Admitting: Physical Therapy

## 2017-05-02 DIAGNOSIS — R600 Localized edema: Secondary | ICD-10-CM

## 2017-05-02 DIAGNOSIS — M25562 Pain in left knee: Secondary | ICD-10-CM

## 2017-05-02 DIAGNOSIS — M6281 Muscle weakness (generalized): Secondary | ICD-10-CM

## 2017-05-02 NOTE — Therapy (Signed)
[facility] [address] [place], [place], [address] Phone: [phone]   Fax:  [phone]  Physical Therapy Treatment  Patient Details  Name: Catherine Patrick MRN: [id number] Date of Birth: 1966-08-02 Referring Provider: Dr. Roda Shutters  Encounter Date: 05/02/2017      PT End of Session - 05/02/17 1038    Visit Number 6   Number of Visits 9  Eval plus 8 visits   Authorization Type Medicaid approved 8 visits till 06/13/2017 + Eval   Authorization - Number of Visits 8   PT Start Time 1015   PT Stop Time 1115   PT Time Calculation (min) 60 min   Activity Tolerance Patient tolerated treatment well      Past Medical History:  Diagnosis Date  . DJD (degenerative joint disease)   . Headache   . History of blood transfusion    2 times after child birth  . History of kidney stones    passed one in 2014- 2017  . Hypertension   . Kidney stones     Past Surgical History:  Procedure Laterality Date  . CESAREAN SECTION    . CESAREAN SECTION    . HERNIA REPAIR    . LIPOSUCTION  2002  . TOTAL KNEE ARTHROPLASTY Right 11/30/2016   Procedure: RIGHT TOTAL KNEE ARTHROPLASTY;  Surgeon: Tarry Kos, MD;  [facility]: [facility] OR;  Service: Orthopedics;  Laterality: Right;  . TOTAL KNEE ARTHROPLASTY Left 03/16/2017   Procedure: LEFT TOTAL KNEE ARTHROPLASTY;  Surgeon: Tarry Kos, MD;  Location: [facility] OR;  Service: Orthopedics;  Laterality: Left;    There were no vitals filed for this visit.      Subjective Assessment - 05/02/17 1015    Subjective Patient states she took her pain tablet about 4:30 and it holds me until 7 pm.  Patient able to walk around the clinic without her cane.     Pertinent History MD 6/25;  Left TKR 03/16/17   Currently in Pain? No/denies   Pain Location Knee   Pain Orientation Left   Pain Type Surgical pain   Aggravating Factors  bending the knee            [facility] PT Assessment - 05/02/17 0001      AROM   Left Knee Extension 0  3 seated   Left Knee Flexion 90  85 seated     Strength   Left Knee Flexion 3+/5   Left Knee Extension 3+/5                     [facility] Adult PT Treatment/Exercise - 05/02/17 0001      Therapeutic Activites    ADL's walking, standing, sit to stand     Neuro Re-ed    Neuro Re-ed Details  quad and gluteal activation left     Knee/Hip Exercises: Stretches   Active Hamstring Stretch Left;5 reps  on step   Primary school teacher reps  on step   Special educational needs teacher Limitations on slant board     Knee/Hip Exercises: Aerobic   Nustep level 2; 10 min; seat #8  LE only; therapist present to discuss treatment     Knee/Hip Exercises: Machines for Strengthening   Total Gym Leg Press 55# bil 10x; 30# left only 10x     Knee/Hip Exercises: Seated   Hamstring Curl Strengthening;Left;2 sets;10 reps   Hamstring Limitations red band   Sit to [employer]  10 reps;without UE support     Knee/Hip Exercises: Supine   Other Supine Knee/Hip Exercises green ball roll for flexion 15x   Other Supine Knee/Hip Exercises HS and glute sets on green ball 15x     Vasopneumatic   Number Minutes Vasopneumatic  15 minutes   Vasopnuematic Location  Knee   Vasopneumatic Pressure Medium   Vasopneumatic Temperature  3 snow flakes     Manual Therapy   Manual therapy comments gentle seated distraction with small range flexion 10x   Soft tissue mobilization quads and hamstrings                  PT Short Term Goals - 05/02/17 1210      PT SHORT TERM GOAL #1   Title Independent with initial HEP for ROM and edema control   Status Achieved           PT Long Term Goals - 05/02/17 1354      PT LONG TERM GOAL #1   Title independent with HEP for further improvements in ROM and strength      Time 8   Period Weeks   Status On-going     PT LONG TERM GOAL #2   Title Patient will be able to walk 500 feet with a walker in the community with minimal  gait deficits   Time 8   Period Weeks   Status On-going     PT LONG TERM GOAL #3   Title 3+/5 quad strength to lift left leg in and out of bed and in/out of the car without UE assist   Status Achieved     PT LONG TERM GOAL #4   Title  knee flexion AROM >/= 95 degrees for greater ease in/out of the car, up and down stairs and sit to stand    Time 8   Period Weeks   Status On-going     PT LONG TERM GOAL #5   Title Left knee pain with daily activities 5/10 or less    Time 8   Period Weeks   Status On-going     PT LONG TERM GOAL #6   Title knee extension >/= -8 degrees so she is able to walk medium community distances   Status Achieved               Plan - 05/02/17 1039    Clinical Impression Statement The patient has much improved knee flexion ROM to 90 degrees in supine and nearly full knee extension.  Improving quad motor control as well and she is now able to lift her leg on/off the treatment without UE assist.  Patient's pain level seems much better under control.  Decreased edema noted following vasocompression.    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/30/2016   PT Treatment/Interventions Cryotherapy;Stair training;Gait training;Ultrasound;Moist Heat;Therapeutic activities;Therapeutic exercise;Balance training;Neuromuscular re-education;Patient/family education;Passive range of motion;Scar mobilization;Manual techniques;Energy conservation;Vasopneumatic Device   PT Next Visit Plan Left knee ROM especially flexion, quad strengthening, edema management as needed      Patient will benefit from skilled therapeutic intervention in order to improve the following deficits and impairments:  Abnormal gait, Decreased range of motion, Difficulty walking, Increased fascial restricitons, Decreased endurance, Decreased activity tolerance, Pain, Decreased scar mobility, Impaired flexibility, Increased  edema, Decreased strength, Decreased mobility  Visit Diagnosis: Acute pain of left knee  Muscle weakness (generalized)  Localized edema  Problem List Patient Active Problem List   Diagnosis Date Noted  . Unilateral primary osteoarthritis, left knee 03/30/2017  . 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 Primary osteoarthritis of both knees 11/18/2016  . HTN (hypertension) 11/10/2016  . Cervical radiculopathy 09/19/2016  . Left arm pain 09/19/2016  . Unilateral primary osteoarthritis, right knee 09/30/2015  . Left lateral ankle pain 09/30/2015  . Plantar fasciitis of left foot 12/18/2013  . 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 Hydronephrosis, right 05/30/2013  . Nephrolithiasis 05/30/2013   Lavinia Sharps, PT 05/02/17 1:56 PM Phone: [phone] Fax: [phone]  Vivien Presto 05/02/2017, 1:56 PM  [facility] [address] [place], [place], [address] Phone: [phone]   Fax:  [phone]  Name: Bennetta Rudden MRN: [id number] Date of Birth: Apr 28, 1966

## 2017-05-04 ENCOUNTER — Ambulatory Visit: Payer: Medicaid Other | Admitting: Physical Therapy

## 2017-05-04 DIAGNOSIS — M25562 Pain in left knee: Secondary | ICD-10-CM

## 2017-05-04 DIAGNOSIS — R600 Localized edema: Secondary | ICD-10-CM

## 2017-05-04 DIAGNOSIS — M25662 Stiffness of left knee, not elsewhere classified: Secondary | ICD-10-CM

## 2017-05-04 DIAGNOSIS — M6281 Muscle weakness (generalized): Secondary | ICD-10-CM

## 2017-05-04 DIAGNOSIS — R262 Difficulty in walking, not elsewhere classified: Secondary | ICD-10-CM

## 2017-05-04 NOTE — Therapy (Signed)
[facility] [address] [place], [place], [address] Phone: [phone]   Fax:  [phone]  Physical Therapy Treatment  Patient Details  Name: Catherine Patrick MRN: [id number] Date of Birth: 01-Mar-1966 Referring Provider: Dr. Roda Shutters  Encounter Date: 05/04/2017      PT End of Session - 05/04/17 0835    Visit Number 7   Number of Visits 9   Date for PT Re-Evaluation 05/18/17   Authorization Type Medicaid approved 8 visits till 06/13/2017 + Eval   PT Start Time 0800   PT Stop Time 0900   PT Time Calculation (min) 60 min   Activity Tolerance Patient tolerated treatment well      Past Medical History:  Diagnosis Date  . DJD (degenerative joint disease)   . Headache   . History of blood transfusion    2 times after child birth  . History of kidney stones    passed one in 2014- 2017  . Hypertension   . Kidney stones     Past Surgical History:  Procedure Laterality Date  . CESAREAN SECTION    . CESAREAN SECTION    . HERNIA REPAIR    . LIPOSUCTION  2002  . TOTAL KNEE ARTHROPLASTY Right 11/30/2016   Procedure: RIGHT TOTAL KNEE ARTHROPLASTY;  Surgeon: Tarry Kos, MD;  [facility];  Service: Orthopedics;  Laterality: Right;  . TOTAL KNEE ARTHROPLASTY Left 03/16/2017   Procedure: LEFT TOTAL KNEE ARTHROPLASTY;  Surgeon: Tarry Kos, MD;  Location[facility];  Service: Orthopedics;  Laterality: Left;    There were no vitals filed for this visit.      Subjective Assessment - 05/04/17 0806    Subjective More pain today b/c I forgot to take the tablet.  I am fasting.     Currently in Pain? Yes   Pain Score 5    Pain Location Knee   Pain Orientation Left   Pain Type Surgical pain   Aggravating Factors  bending the knee                          [facility] Adult PT Treatment/Exercise - 05/04/17 0001      Therapeutic Activites    ADL's walking, standing, sit to stand     Neuro Re-ed    Neuro Re-ed  Details  quad and gluteal activation left     Knee/Hip Exercises: Stretches   Active Hamstring Stretch Left;5 reps  on step   Primary school teacher reps  on step   Special educational needs teacher Limitations on slant board     Knee/Hip Exercises: Aerobic   Nustep Level 1 2 min seat 8, 6 min seat 7     Knee/Hip Exercises: Machines for Strengthening   Total Gym Leg Press seat 6 55# bil 10x; 30# left only 10x     Knee/Hip Exercises: Standing   Forward Step Up Left;1 set;10 reps;Hand Hold: 2   Step Down Left;10 reps;Hand Hold: 2;Step Height: 2"   SLS with Vectors 3 ways 2x5     Knee/Hip Exercises: Seated   Long Arc Quad Strengthening;Left;2 sets;10 reps;Weights   Long Arc Quad Weight 3 lbs.   Hamstring Curl Strengthening;Left;2 sets;10 reps   Hamstring Limitations red band     Knee/Hip Exercises: Supine   Other Supine Knee/Hip Exercises green ball roll for flexion 15x   Other Supine Knee/Hip Exercises HS and glute sets on green  ball 15x     Vasopneumatic   Number Minutes Vasopneumatic  15 minutes   Vasopnuematic Location  Knee   Vasopneumatic Pressure Medium   Vasopneumatic Temperature  3 snow flakes     Manual Therapy   Manual Therapy Muscle Energy Technique   Manual therapy comments gentle seated distraction with small range flexion 10x   Muscle Energy Technique contract relax quads 5 sec hold 5x                  PT Short Term Goals - 05/04/17 0850      PT SHORT TERM GOAL #1   Title Independent with initial HEP for ROM and edema control   Status Achieved           PT Long Term Goals - 05/04/17 0850      PT LONG TERM GOAL #1   Title independent with HEP for further improvements in ROM and strength      Time 8   Period Weeks   Status On-going     PT LONG TERM GOAL #2   Title Patient will be able to walk 500 feet with a walker in the community with minimal gait deficits   Time 8   Period Weeks   Status On-going     PT LONG TERM GOAL #3    Title 3+/5 quad strength to lift left leg in and out of bed and in/out of the car without UE assist   Status Achieved     PT LONG TERM GOAL #4   Title  knee flexion AROM >/= 95 degrees for greater ease in/out of the car, up and down stairs and sit to stand    Time 8   Period Weeks   Status On-going     PT LONG TERM GOAL #5   Title Left knee pain with daily activities 5/10 or less    Time 8   Period Weeks   Status On-going     PT LONG TERM GOAL #6   Title knee extension >/= -8 degrees so she is able to walk medium community distances   Status Achieved               Plan - 05/04/17 0836    Clinical Impression Statement Knee flexion improved to 90 degrees actively, slightly more active-assisted and passively.  Quick quad muscle fatigue with weight bearing on surgical leg and with step exercises ( up and down.)  Ambulating in clinic without her cane with a mild limp/decreased stance time on left.  Mild left knee swelling decreased following vasocompression.   She hopes to be well enough to visit family in [place] in July.     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/30/2016   PT Treatment/Interventions Cryotherapy;Stair training;Gait training;Ultrasound;Moist Heat;Therapeutic activities;Therapeutic exercise;Balance training;Neuromuscular re-education;Patient/family education;Passive range of motion;Scar mobilization;Manual techniques;Energy conservation;Vasopneumatic Device   PT Next Visit Plan Left knee ROM especially flexion, quad strengthening, edema management as needed;  2 more visits approved       Patient will benefit from skilled therapeutic intervention in order to improve the following deficits and impairments:  Abnormal gait, Decreased range of motion, Difficulty walking, Increased fascial restricitons, Decreased endurance, Decreased activity tolerance, Pain, Decreased scar mobility,  Impaired flexibility, Increased edema, Decreased strength, Decreased mobility  Visit Diagnosis: Acute pain of left knee  Muscle weakness (generalized)  Localized edema  Stiffness of left knee, not elsewhere  classified  Difficulty in walking, not elsewhere classified     Problem List Patient Active Problem List   Diagnosis Date Noted  . Unilateral primary osteoarthritis, left knee 03/30/2017  . 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 Primary osteoarthritis of both knees 11/18/2016  . HTN (hypertension) 11/10/2016  . Cervical radiculopathy 09/19/2016  . Left arm pain 09/19/2016  . Unilateral primary osteoarthritis, right knee 09/30/2015  . Left lateral ankle pain 09/30/2015  . Plantar fasciitis of left foot 12/18/2013  . 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 Hydronephrosis, right 05/30/2013  . Nephrolithiasis 05/30/2013   Lavinia SharpsStacy Meer Reindl, PT 05/04/17 8:52 AM Phone: [phone] Fax: [phone]  Vivien PrestoSimpson, Keeghan Bialy C 05/04/2017, 8:52 AM  [facility] [address] [place], [place], [address] Phone: [phone]   Fax:  [phone]  Name: Gwynneth AlimentSulwa Rathe MRN: [id number] Date of Birth: 12/19/1965

## 2017-05-05 ENCOUNTER — Telehealth (INDEPENDENT_AMBULATORY_CARE_PROVIDER_SITE_OTHER): Payer: Self-pay | Admitting: *Deleted

## 2017-05-05 NOTE — Telephone Encounter (Signed)
Patient called in this afternoon in regards to having some issues with getting her Oxycodone? Because they need a prior-authorization through her Medicaid. Her CB # [phone]. Thank you

## 2017-05-08 NOTE — Anesthesia Postprocedure Evaluation (Signed)
Anesthesia Post Note  Patient: Catherine Patrick  Procedure(s) Performed: Procedure(s) (LRB): LEFT TOTAL KNEE ARTHROPLASTY (Left)     Anesthesia Post Evaluation  Last Vitals:  Vitals:   03/19/17 1500 03/19/17 2056  BP: 117/63 135/65  Pulse: (!) 110 94  Resp: 18 18  Temp: 36.7 C 37.2 C    Last Pain:  Vitals:   03/20/17 1000  TempSrc:   PainSc: 3                  Fred Hammes S

## 2017-05-08 NOTE — Telephone Encounter (Signed)
Can you check this for me bc remember we saw it was approved on [place] [employer].

## 2017-05-08 NOTE — Addendum Note (Signed)
Addendum  created 05/08/17 1313 by Aniya Jolicoeur, MD   Sign clinical note    

## 2017-05-10 ENCOUNTER — Ambulatory Visit: Payer: Medicaid Other | Attending: Orthopaedic Surgery

## 2017-05-10 DIAGNOSIS — M25562 Pain in left knee: Secondary | ICD-10-CM

## 2017-05-10 DIAGNOSIS — M6281 Muscle weakness (generalized): Secondary | ICD-10-CM | POA: Diagnosis present

## 2017-05-10 DIAGNOSIS — M25662 Stiffness of left knee, not elsewhere classified: Secondary | ICD-10-CM | POA: Diagnosis present

## 2017-05-10 DIAGNOSIS — R262 Difficulty in walking, not elsewhere classified: Secondary | ICD-10-CM

## 2017-05-10 DIAGNOSIS — R600 Localized edema: Secondary | ICD-10-CM | POA: Diagnosis present

## 2017-05-10 NOTE — Therapy (Signed)
[facility] [address], [address] [place], [place], [address] Phone: [phone]   Fax:  [phone]  Physical Therapy Treatment  Patient Details  Name: Catherine Patrick MRN: [id number] Date of Birth: [birth year] Referring Provider: Dr. Roda Shutters  Encounter Date: 05/10/2017      PT End of Session - 05/10/17 0844    Visit Number 8   Number of Visits 9   Date for PT Re-Evaluation 05/18/17   Authorization Type Medicaid approved 8 visits till 06/13/2017 + Eval   Authorization - Visit Number 7   Authorization - Number of Visits 8   PT Start Time 0803   PT Stop Time 0857   PT Time Calculation (min) 54 min   Activity Tolerance Patient tolerated treatment well   Behavior During Therapy [facility] for tasks assessed/performed      Past Medical History:  Diagnosis Date  . DJD (degenerative joint disease)   . Headache   . History of blood transfusion    2 times after child birth  . History of kidney stones    passed one in 2014- 2017  . Hypertension   . Kidney stones     Past Surgical History:  Procedure Laterality Date  . CESAREAN SECTION    . CESAREAN SECTION    . HERNIA REPAIR    . LIPOSUCTION  2002  . TOTAL KNEE ARTHROPLASTY Right 11/30/2016   Procedure: RIGHT TOTAL KNEE ARTHROPLASTY;  Surgeon: Tarry Kos, MD;  [facility];  Service: Orthopedics;  Laterality: Right;  . TOTAL KNEE ARTHROPLASTY Left 03/16/2017   Procedure: LEFT TOTAL KNEE ARTHROPLASTY;  Surgeon: Tarry Kos, MD;  Location: [facility];  Service: Orthopedics;  Laterality: Left;    There were no vitals filed for this visit.      Subjective Assessment - 05/10/17 0805    Subjective Doing well with exercises at home.     Pertinent History MD 6/25;  Left TKR 03/16/17   Currently in Pain? Yes   Pain Score 4    Pain Location Knee   Pain Orientation Left   Pain Descriptors / Indicators Aching   Pain Type Surgical pain   Pain Onset More than a month ago   Pain Frequency  Constant   Aggravating Factors  walking, bending the knee                         [facility] Adult PT Treatment/Exercise - 05/10/17 0001      Knee/Hip Exercises: Stretches   Active Hamstring Stretch Left;3 reps;20 seconds   Gastroc Stretch 5 reps   Gastroc Stretch Limitations on slant board     Knee/Hip Exercises: Aerobic   Nustep Level 2 x 8 minutes  PT present to discuss progress     Knee/Hip Exercises: Machines for Strengthening   Total Gym Leg Press seat 6 55# bil 2x10, 30# left only 2x10     Knee/Hip Exercises: Standing   Forward Step Up Left;10 reps;Hand Hold: 2;2 sets   Step Down Left;10 reps;Hand Hold: 2;Step Height: 4"  good eccentric control on Lt   Rebounder 3 way 1 min each     Knee/Hip Exercises: Seated   Long Arc Quad Strengthening;Left;2 sets;10 reps;Weights   Long Arc Quad Weight 3 lbs.   Sit to Sand without UE support;20 reps     Vasopneumatic   Number Minutes Vasopneumatic  15 minutes   Vasopnuematic Location  Knee   Vasopneumatic Pressure Medium  Vasopneumatic Temperature  3 snow flakes     Manual Therapy   Manual Therapy Passive ROM   Passive ROM Lt knee into flexion                  PT Short Term Goals - 05/04/17 0850      PT SHORT TERM GOAL #1   Title Independent with initial HEP for ROM and edema control   Status Achieved           PT Long Term Goals - 05/10/17 0806      PT LONG TERM GOAL #1   Title independent with HEP for further improvements in ROM and strength      Time 8   Period Weeks   Status On-going     PT LONG TERM GOAL #2   Title Patient will be able to walk 500 feet with a walker in the community with minimal gait deficits   Baseline using cane in the community   Time 8   Period Weeks   Status On-going     PT LONG TERM GOAL #5   Title Left knee pain with daily activities 5/10 or less    Period Weeks   Status On-going               Plan - 05/10/17 [id number]    Clinical Impression  Statement Pt with Lt knee A/ROM to ~90 degrees now.  Pt with Lt quad fatigue with exercise, especially with standing sterngth.  Pt is only using the cane in the community.  Pt with continued stiffness and edema s/p TKA.  Pt with only 1 visit remaining on Medicaid approval.  1 more session probable to finalize HEP.     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/30/2016   PT Frequency 2x / week   PT Duration 4 weeks   PT Treatment/Interventions Cryotherapy;Stair training;Gait training;Ultrasound;Moist Heat;Therapeutic activities;Therapeutic exercise;Balance training;Neuromuscular re-education;Patient/family education;Passive range of motion;Scar mobilization;Manual techniques;Energy conservation;Vasopneumatic Device   PT Next Visit Plan 1 more session.  Probable D/C to HEP due to insurance limits.  Test ROM and strength.     Consulted and Agree with Plan of Care Patient      Patient will benefit from skilled therapeutic intervention in order to improve the following deficits and impairments:  Abnormal gait, Decreased range of motion, Difficulty walking, Increased fascial restricitons, Decreased endurance, Decreased activity tolerance, Pain, Decreased scar mobility, Impaired flexibility, Increased edema, Decreased strength, Decreased mobility  Visit Diagnosis: Acute pain of left knee  Muscle weakness (generalized)  Localized edema  Stiffness of left knee, not elsewhere classified  Difficulty in walking, not elsewhere classified     Problem List Patient Active Problem List   Diagnosis Date Noted  . Unilateral primary osteoarthritis, left knee 03/30/2017  . 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 Primary osteoarthritis of both knees 11/18/2016  . HTN (hypertension) 11/10/2016  . Cervical radiculopathy 09/19/2016  . Left arm pain 09/19/2016  . Unilateral primary osteoarthritis, right knee 09/30/2015   . Left lateral ankle pain 09/30/2015  . Plantar fasciitis of left foot 12/18/2013  . 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 Hydronephrosis, right 05/30/2013  . Nephrolithiasis 05/30/2013     Lorrene ReidKelly Zyad Boomer, PT 05/10/17 8:45 AM  [facility] [address], [address] [place], [place], [address] Phone: [phone]   Fax:  [phone]  Name: Catherine Patrick MRN: [id number] Date of Birth: [birth year]

## 2017-05-12 ENCOUNTER — Encounter: Payer: Self-pay | Admitting: Physical Therapy

## 2017-05-12 ENCOUNTER — Ambulatory Visit: Payer: Medicaid Other | Admitting: Physical Therapy

## 2017-05-12 DIAGNOSIS — R600 Localized edema: Secondary | ICD-10-CM

## 2017-05-12 DIAGNOSIS — M25562 Pain in left knee: Secondary | ICD-10-CM | POA: Diagnosis not present

## 2017-05-12 DIAGNOSIS — M25662 Stiffness of left knee, not elsewhere classified: Secondary | ICD-10-CM

## 2017-05-12 DIAGNOSIS — M6281 Muscle weakness (generalized): Secondary | ICD-10-CM

## 2017-05-12 NOTE — Patient Instructions (Addendum)
Heel Raise: Bilateral (Standing)    Rise on balls of feet. Repeat _20___ times per set. Do __1__ sets per session. Do _1___ sessions per day.  http://orth.exer.us/39   Copyright  VHI. All rights reserved.  FLEXION: Sitting - Resistance Band (Active)    Sit with right leg extended. Against yellow resistance band, bend knee and draw foot backward. Complete _2__ sets of _10__ repetitions. Perform _1__ sessions per day.  http://gtsc.exer.us/231   Copyright  VHI. All rights reserved.  EXTENSION: Sitting - Resistance Band (Active)    Sit with feet flat. Against yellow resistance band, straighten right knee. Complete _2__ sets of _10__ repetitions. Perform __1_ sessions per day.  Copyright  VHI. All rights reserved.  [facility] [address], [address] [place], [place] [address] Phone # [phone] Fax [phone]

## 2017-05-12 NOTE — Therapy (Signed)
[facility] [address], [place], [place], [address] Phone: [phone]   Fax:  [phone]  Physical Therapy Treatment  Patient Details  Name: Catherine Patrick MRN: [id number] Date of Birth: [birth year] Referring Provider: Dr. Erlinda Hong  Encounter Date: 05/12/2017      PT End of Session - 05/12/17 0924    Visit Number 9   Number of Visits 9   Date for PT Re-Evaluation 05/18/17   Authorization Type Medicaid approved 8 visits till 06/13/2017 + Eval   Authorization - Visit Number 8   PT Start Time 0845   PT Stop Time 0924   PT Time Calculation (min) 39 min   Activity Tolerance Patient tolerated treatment well   Behavior During Therapy [facility] for tasks assessed/performed      Past Medical History:  Diagnosis Date  . DJD (degenerative joint disease)   . Headache   . History of blood transfusion    2 times after child birth  . History of kidney stones    passed one in 2014- 2017  . Hypertension   . Kidney stones     Past Surgical History:  Procedure Laterality Date  . CESAREAN SECTION    . CESAREAN SECTION    . HERNIA REPAIR    . LIPOSUCTION  2002  . TOTAL KNEE ARTHROPLASTY Right 11/30/2016   Procedure: RIGHT TOTAL KNEE ARTHROPLASTY;  Surgeon: Leandrew Koyanagi, MD;  Location: [place];  Service: Orthopedics;  Laterality: Right;  . TOTAL KNEE ARTHROPLASTY Left 03/16/2017   Procedure: LEFT TOTAL KNEE ARTHROPLASTY;  Surgeon: Leandrew Koyanagi, MD;  Location: [place];  Service: Orthopedics;  Laterality: Left;    There were no vitals filed for this visit.      Subjective Assessment - 05/12/17 0855    Subjective I feel good.    Pertinent History MD 6/25;  Left TKR 03/16/17   Limitations House hold activities;Walking;Standing   How long can you walk comfortably? home and to MD appts only   Diagnostic tests x-ray before surgery   Patient Stated Goals drive my care, move easily so I can get to appointments   Currently in Pain? No/denies             [facility] PT Assessment - 05/12/17 0001      Assessment   Medical Diagnosis right total knee replacement;  osteoarthritis   Referring Provider Dr. Erlinda Hong   Prior Therapy 4 visits HH[facility]     Precautions   Precautions None     Restrictions   Weight Bearing Restrictions No     Balance Screen   Has the patient fallen in the past 6 months No   Has the patient had a decrease in activity level because of a fear of falling?  No   Is the patient reluctant to leave their home because of a fear of falling?  No     Home Ecologist residence     Prior Function   Level of Independence Independent with household mobility with device     Cognition   Overall Cognitive Status Within Functional Limits for tasks assessed     Observation/Other Assessments   Focus on Therapeutic Outcomes (FOTO)  55% limitation     Circumferential Edema   Circumferential - Left  46.5 cm     Posture/Postural Control   Posture/Postural Control No significant limitations     AROM   Left Knee Extension 0  3 seated  Left Knee Flexion 90  85 seated     PROM   Left Knee Extension 0   Left Knee Flexion 95     Strength   Left Knee Flexion 4-/5   Left Knee Extension 4+/5                     [facility] Adult PT Treatment/Exercise - 05/12/17 0001      Knee/Hip Exercises: Stretches   Active Hamstring Stretch Left;3 reps;30 seconds   Active Hamstring Stretch Limitations on step   Quad Stretch Left;2 reps;30 seconds  on step   Gastroc Stretch 30 seconds;2 reps   Gastroc Stretch Limitations on slant board     Knee/Hip Exercises: Aerobic   Nustep Level 2 x 8 minutes  PT present to discuss progress     Knee/Hip Exercises: Machines for Strengthening   Total Gym Leg Press seat 6 55# bil 2x10, 30# left only 2x10     Knee/Hip Exercises: Standing   Forward Step Up Left;10 reps;Hand Hold: 2;2 sets   Forward Step Up Limitations VC to not use right leg as much    Other Standing Knee Exercises bilateral heel raises 10x                PT Education - 05/12/17 [phone]    Education provided Yes   Education Details knee flexion and extension with green band, heel raises   Person(s) Educated Patient   Methods Explanation;Demonstration;Verbal cues;Handout   Comprehension Returned demonstration;Verbalized understanding          PT Short Term Goals - 05/04/17 0850      PT SHORT TERM GOAL #1   Title Independent with initial HEP for ROM and edema control   Status Achieved           PT Long Term Goals - 05/12/17 0926      PT LONG TERM GOAL #1   Title independent with HEP for further improvements in ROM and strength      Time 8   Period Weeks   Status Achieved     PT LONG TERM GOAL #3   Title 3+/5 quad strength to lift left leg in and out of bed and in/out of the car without UE assist   Time 8   Period Weeks   Status Achieved     PT LONG TERM GOAL #4   Title  knee flexion AROM >/= 95 degrees for greater ease in/out of the car, up and down stairs and sit to stand    Baseline Pt is driving some   Time 8   Period Weeks   Status Partially Met     PT LONG TERM GOAL #5   Title Left knee pain with daily activities 5/10 or less    Baseline Only difficult due to Lt knee pain   Time 8   Period Weeks   Status Achieved     PT LONG TERM GOAL #6   Title knee extension >/= -8 degrees so she is able to walk medium community distances   Time 8   Period Weeks   Status Achieved               Plan - 05/12/17 0854    Clinical Impression Statement Patient has met her goals. She is independent with her HEP.  Left knee strength for flexion 4-/5 and extension 4+/5.  Left knee PROM is 0-95 and AROM 0-90 degrees.  Patient is being discharged due to limited  visits with insurance.     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/30/2016   PT  Treatment/Interventions Cryotherapy;Stair training;Gait training;Ultrasound;Moist Heat;Therapeutic activities;Therapeutic exercise;Balance training;Neuromuscular re-education;Patient/family education;Passive range of motion;Scar mobilization;Manual techniques;Energy conservation;Vasopneumatic Device   PT Next Visit Plan Discharge to HEP   PT Home Exercise Plan Current HEP   Consulted and Agree with Plan of Care Patient      Patient will benefit from skilled therapeutic intervention in order to improve the following deficits and impairments:  Abnormal gait, Decreased range of motion, Difficulty walking, Increased fascial restricitons, Decreased endurance, Decreased activity tolerance, Pain, Decreased scar mobility, Impaired flexibility, Increased edema, Decreased strength, Decreased mobility  Visit Diagnosis: Acute pain of left knee  Muscle weakness (generalized)  Localized edema  Stiffness of left knee, not elsewhere classified     Problem List Patient Active Problem List   Diagnosis Date Noted  . Unilateral primary osteoarthritis, left knee 03/30/2017  . 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 Primary osteoarthritis of both knees 11/18/2016  . HTN (hypertension) 11/10/2016  . Cervical radiculopathy 09/19/2016  . Left arm pain 09/19/2016  . Unilateral primary osteoarthritis, right knee 09/30/2015  . Left lateral ankle pain 09/30/2015  . Plantar fasciitis of left foot 12/18/2013  . 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 Hydronephrosis, right 05/30/2013  . Nephrolithiasis 05/30/2013    Earlie Counts, PT 05/12/17 9:28 AM    [facility] [address] [place], [place], [address] Phone: [phone]   Fax:  [phone]  Name: Catherine Patrick MRN: [id number] Date of Birth: 09/02/1966 PHYSICAL THERAPY DISCHARGE SUMMARY  Visits from Start of Care: 9  Current  functional level related to goals / functional outcomes: See above.    Remaining deficits: See above   Education / Equipment: HEP Plan: Patient agrees to discharge.  Patient goals were met. Patient is being discharged due to meeting the stated rehab goals.  Thank you for the referral. Earlie Counts, PT 05/12/17 9:28 AM  ?????

## 2017-05-12 NOTE — Telephone Encounter (Signed)
IC patient and she said she got the oxycontin reimbursement, but not the oxycodone, I looked online and the oxycodone is dated 03/22/17, she picked up the Rx 03/20/17.  So IC [place] [employer] and asked for the date to be changed, it is done now, IC pt and advised her also to contact pharmacy to see about getting her reimbursement.

## 2017-05-29 ENCOUNTER — Ambulatory Visit (INDEPENDENT_AMBULATORY_CARE_PROVIDER_SITE_OTHER): Payer: Medicaid Other | Admitting: Orthopaedic Surgery

## 2017-05-29 ENCOUNTER — Encounter (INDEPENDENT_AMBULATORY_CARE_PROVIDER_SITE_OTHER): Payer: Self-pay | Admitting: Orthopaedic Surgery

## 2017-05-29 DIAGNOSIS — M1711 Unilateral primary osteoarthritis, right knee: Secondary | ICD-10-CM

## 2017-05-29 DIAGNOSIS — M1712 Unilateral primary osteoarthritis, left knee: Secondary | ICD-10-CM

## 2017-05-29 MED ORDER — AMOXICILLIN 500 MG PO CAPS: 2000.0000 mg | ORAL_CAPSULE | Freq: Once | ORAL | 0 refills | Status: AC

## 2017-05-29 NOTE — Progress Notes (Signed)
Patient is 74 days status post left total knee replacement. She is doing well. Her range of motion is progressing. Her surgical scar is fully healed. Her pain is well-controlled with occasional ibuprofen. Her range of motion is 0 to about 100. At this point continue with home exercises. Dental antibiotic prophylaxis was reinforced. Follow-up in 3 months with 2 view x-rays of the both knees.

## 2017-05-30 ENCOUNTER — Ambulatory Visit (INDEPENDENT_AMBULATORY_CARE_PROVIDER_SITE_OTHER): Payer: Medicaid Other | Admitting: Orthopaedic Surgery

## 2017-06-05 ENCOUNTER — Telehealth (INDEPENDENT_AMBULATORY_CARE_PROVIDER_SITE_OTHER): Payer: Self-pay | Admitting: Orthopaedic Surgery

## 2017-06-05 NOTE — Telephone Encounter (Signed)
Catherine AlimentSulwa Patrick Self [phone]   Catherine Patrick stop by to say she had been to the dentist and had taking her antibiotic and they only looked in her mouth today to determine what needs to be done, they will be calling her as soon as they get prior authorization from Medicaid to schedule cleaning and filling. Does she need an antibiotic each time she goes to dentist? Her PCP is also wanting her to get a tetanus shot on July 5th, is that okay and does she need one? Please call and advise.

## 2017-06-06 MED ORDER — AMOXICILLIN 500 MG PO CAPS: ORAL_CAPSULE | ORAL | 4 refills | Status: DC

## 2017-06-06 NOTE — Telephone Encounter (Signed)
Do you do antibiotics for dental procedures for your total joints?  See below, pls advise, thanks

## 2017-06-06 NOTE — Telephone Encounter (Signed)
IC pt on all and advised, Rx for more abx sent to pharm

## 2017-06-06 NOTE — Telephone Encounter (Signed)
Yes 2 g amoxicillin 30 mins before procedure

## 2017-06-06 NOTE — Telephone Encounter (Signed)
Thanks.  Patient also asking if it is ok by you to get a tetanus shot at this time?

## 2017-06-06 NOTE — Telephone Encounter (Signed)
yes

## 2017-06-08 ENCOUNTER — Ambulatory Visit: Payer: Medicaid Other | Admitting: Family Medicine

## 2017-06-16 ENCOUNTER — Encounter: Payer: Medicaid Other | Admitting: Family Medicine

## 2017-06-22 ENCOUNTER — Encounter: Payer: Self-pay | Admitting: Family Medicine

## 2017-06-22 ENCOUNTER — Ambulatory Visit: Payer: Medicaid Other | Attending: Family Medicine | Admitting: Family Medicine

## 2017-06-22 VITALS — BP 116/82 | HR 82 | Temp 97.7°F | Ht <= 58 in | Wt 171.6 lb

## 2017-06-22 DIAGNOSIS — Z23 Encounter for immunization: Secondary | ICD-10-CM

## 2017-06-22 DIAGNOSIS — Z0000 Encounter for general adult medical examination without abnormal findings: Secondary | ICD-10-CM

## 2017-06-22 DIAGNOSIS — I10 Essential (primary) hypertension: Secondary | ICD-10-CM | POA: Diagnosis not present

## 2017-06-22 DIAGNOSIS — Z96651 Presence of right artificial knee joint: Secondary | ICD-10-CM | POA: Insufficient documentation

## 2017-06-22 DIAGNOSIS — M170 Bilateral primary osteoarthritis of knee: Secondary | ICD-10-CM | POA: Insufficient documentation

## 2017-06-22 DIAGNOSIS — Z96652 Presence of left artificial knee joint: Secondary | ICD-10-CM

## 2017-06-22 DIAGNOSIS — Z7982 Long term (current) use of aspirin: Secondary | ICD-10-CM | POA: Insufficient documentation

## 2017-06-22 MED ORDER — IBUPROFEN 600 MG PO TABS: 600.0000 mg | ORAL_TABLET | Freq: Two times a day (BID) | ORAL | 2 refills | Status: DC | PRN

## 2017-06-22 NOTE — Progress Notes (Signed)
Subjective:  Patient ID: Catherine Patrick, female    DOB: Apr 02, 1966  Age: 51 y.o. MRN: [id number]  CC: Follow-up   HPI Catherine Patrick has history of hypertension, osteoarthritis in both knees she  presents for   1. Healthcare maintenance: she presents for Tdap and gastroenterology referral for screening colonoscopy. She has never had a screening colonoscopy. She denies family history of colon cancer. She denies blood in stool or bowel changes.   2. History of HTN: she had persistently elevated BP between 10/2016-11/2016, this was in the setting of knee pain, and her R total knee replacement. She is now not taking amlodipine 5 mg daily . She denies, HA, CP, SOB and leg swelling. She reports mild knee pain after work. 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 Pain is relieved with rest and one dose of ibuprofen.   Past Surgical History:  Procedure Laterality Date  . CESAREAN SECTION    . CESAREAN SECTION    . HERNIA REPAIR    . LIPOSUCTION  2002  . TOTAL KNEE ARTHROPLASTY Right 11/30/2016   Procedure: RIGHT TOTAL KNEE ARTHROPLASTY;  Surgeon: Tarry Kos, MD;  Location: [facility];  Service: Orthopedics;  Laterality: Right;  . TOTAL KNEE ARTHROPLASTY Left 03/16/2017   Procedure: LEFT TOTAL KNEE ARTHROPLASTY;  Surgeon: Tarry Kos, MD;  Location: [facility] OR;  Service: Orthopedics;  Laterality: Left;     Social History  Substance Use Topics  . Smoking status: Never Smoker  . Smokeless tobacco: Never Used  . Alcohol use No    Outpatient Medications Prior to Visit  Medication Sig Dispense Refill  . acetaminophen (TYLENOL) 500 MG tablet Take 2 tablets (1,000 mg total) by mouth every 6 (six) hours as needed. 30 tablet 2  . acetaminophen-codeine (TYLENOL #3) 300-30 MG tablet Take 1-2 tablets by mouth at bedtime as needed for moderate pain. 60 tablet 0  . amLODipine (NORVASC) 5 MG tablet Take 1 tablet (5 mg total) by mouth daily. 30 tablet 5  . amoxicillin (AMOXIL) 500 MG capsule Take 4  tablets (2 grams) 1 hour prior to any dental procedures. 16 capsule 4  . aspirin EC 325 MG tablet Take 1 tablet (325 mg total) by mouth 2 (two) times daily. 84 tablet 0  . aspirin EC 325 MG tablet Take 1 tablet (325 mg total) by mouth 2 (two) times daily. 84 tablet 0  . cholecalciferol (VITAMIN D) 1000 units tablet Take 1,000 Units by mouth daily.    . Glucosamine-Chondroitin 750-600 MG TABS Take 2 tablets by mouth daily.  0  . HYDROcodone-acetaminophen (NORCO) 7.5-325 MG tablet 1 tab po bid prn pain 30 tablet 0  . ibuprofen (ADVIL,MOTRIN) 600 MG tablet Take 1 tablet (600 mg total) by mouth every 8 (eight) hours as needed. 60 tablet 3  . ibuprofen (ADVIL,MOTRIN) 800 MG tablet 1-2 PO BID PRN PAIN 60 tablet 0  . ibuprofen (ADVIL,MOTRIN) 800 MG tablet TAKE 1 TABLET BY MOUTH EVERY 8 HOURS AS NEEDED. 30 tablet 2  . methocarbamol (ROBAXIN) 750 MG tablet Take 1 tablet (750 mg total) by mouth 2 (two) times daily as needed for muscle spasms. 60 tablet 0  . methocarbamol (ROBAXIN) 750 MG tablet Take 1 tablet (750 mg total) by mouth 2 (two) times daily as needed for muscle spasms. 60 tablet 0  . ondansetron (ZOFRAN) 4 MG tablet Take 1-2 tablets (4-8 mg total) by mouth every 8 (eight) hours as needed for nausea or vomiting. 40 tablet 0  .  ondansetron (ZOFRAN) 4 MG tablet Take 1-2 tablets (4-8 mg total) by mouth every 8 (eight) hours as needed for nausea or vomiting. 40 tablet 0  . oxyCODONE (OXY IR/ROXICODONE) 5 MG immediate release tablet Take 1-3 tablets (5-15 mg total) by mouth every 4 (four) hours as needed. 90 tablet 0  . oxyCODONE (OXY IR/ROXICODONE) 5 MG immediate release tablet Take 1-2 tablets (5-10 mg total) by mouth every 6 (six) hours as needed. 40 tablet 0  . oxyCODONE (OXY IR/ROXICODONE) 5 MG immediate release tablet Take 1-3 tablets (5-15 mg total) by mouth every 4 (four) hours as needed. 90 tablet 0  . oxyCODONE (OXY IR/ROXICODONE) 5 MG immediate release tablet Take 1-2 tablets (5-10 mg total) by  mouth 2 (two) times daily as needed. 60 tablet 0  . oxyCODONE (OXYCONTIN) 10 mg 12 hr tablet Take 1 tablet (10 mg total) by mouth every 12 (twelve) hours. 10 tablet 0  . oxyCODONE (OXYCONTIN) 10 mg 12 hr tablet Take 1 tablet (10 mg total) by mouth every 12 (twelve) hours. (Patient not taking: Reported on 04/04/2017) 10 tablet 0  . promethazine (PHENERGAN) 25 MG tablet Take 1 tablet (25 mg total) by mouth every 6 (six) hours as needed for nausea. (Patient not taking: Reported on 03/30/2017) 30 tablet 1  . promethazine (PHENERGAN) 25 MG tablet Take 1 tablet (25 mg total) by mouth every 6 (six) hours as needed for nausea. (Patient not taking: Reported on 03/30/2017) 30 tablet 1  . senna-docusate (SENOKOT S) 8.6-50 MG tablet Take 1 tablet by mouth at bedtime as needed. (Patient not taking: Reported on 03/30/2017) 30 tablet 1  . senna-docusate (SENOKOT S) 8.6-50 MG tablet Take 1 tablet by mouth at bedtime as needed. (Patient not taking: Reported on 04/04/2017) 30 tablet 1  . Turmeric 500 MG CAPS Take 500 mg by mouth daily. (Patient not taking: Reported on 04/04/2017)     No facility-administered medications prior to visit.     ROS Review of Systems  Constitutional: Negative for chills and fever.  Eyes: Negative for visual disturbance.  Respiratory: Negative for shortness of breath.   Cardiovascular: Negative for chest pain.  Gastrointestinal: Negative for abdominal pain and blood in stool.  Musculoskeletal: Positive for arthralgias. Negative for back pain.  Skin: Negative for rash.  Allergic/Immunologic: Negative for immunocompromised state.  Hematological: Negative for adenopathy. Does not bruise/bleed easily.  Psychiatric/Behavioral: Negative for dysphoric mood and suicidal ideas.    Objective:  BP 116/82   Pulse 82   Temp 97.7 F (36.5 C) (Oral)   Ht 4\' 8"  (1.422 m)   Wt 171 lb 9.6 oz (77.8 kg)   LMP 03/19/2014   SpO2 98%   BMI 38.47 kg/m   BP/Weight 06/22/2017 03/19/2017 03/16/2017    Systolic BP 116 135 -  Diastolic BP 82 65 -  Wt. (Lbs) 171.6 - 171  BMI 38.47 - 37    Physical Exam  Constitutional: She is oriented to person, place, and time. She appears well-developed and well-nourished. No distress.  HENT:  Head: Normocephalic and atraumatic.  Cardiovascular: Normal rate, regular rhythm, normal heart sounds and intact distal pulses.   Pulmonary/Chest: Effort normal and breath sounds normal.  Musculoskeletal: She exhibits no edema.  Neurological: She is alert and oriented to person, place, and time.  Skin: Skin is warm and dry. No rash noted.  Psychiatric: She has a normal mood and affect.     Assessment & Plan:  Catherine Patrick was seen today for follow-up.  Diagnoses and  all orders for this visit:  Healthcare maintenance -     Ambulatory referral to Gastroenterology  Status post total left knee replacement -     ibuprofen (ADVIL,MOTRIN) 600 MG tablet; Take 1 tablet (600 mg total) by mouth 2 (two) times daily as needed for moderate pain. With food  Essential hypertension  Need for diphtheria-tetanus-pertussis (Tdap) vaccine -     Tdap vaccine greater than or equal to 7yo IM   There are no diagnoses linked to this encounter.  No orders of the defined types were placed in this encounter.   Follow-up: Return in about 6 months (around 12/23/2017) for check up, sooner if needed .   Dessa PhiJosalyn Makinzi Prieur MD

## 2017-06-22 NOTE — Patient Instructions (Addendum)
Catherine Patrick was seen today for follow-up.  Diagnoses and all orders for this visit:  Healthcare maintenance -     Ambulatory referral to Gastroenterology   Return for flu shot in 2 months, RN visit or flu clinic F/u in 6 months for check up sooner if needed   Dr. Armen Patrick   Starting on July 24, 2017 [facility]. You are welcome to follow up with me there if you like. Catherine Patrick accepts health insurance and self pay.   [facility]  [address] [place], [place] [address]  Ph: [phone] Fax: [phone]   Colorectal Cancer Screening Colorectal cancer screening is a group of tests used to check for colorectal cancer. Colorectal refers to your colon and rectum. Your colon and rectum are located at the end of your large intestine and carry your bowel movements out of your body. Why is colorectal cancer screening done? It is common for abnormal growths (polyps) to form in the lining of your colon, especially as you get older. These polyps can be cancerous or become cancerous. If colorectal cancer is found at an early stage, it is treatable. Who should be screened for colorectal cancer? Screening is recommended for all adults at average risk starting at age 53. Tests may be recommended every 1 to 10 years. Your health care provider may recommend earlier or more frequent screening if you have:  A history of colorectal cancer or polyps.  A family member with a history of colorectal cancer or polyps.  Inflammatory bowel disease, such as ulcerative colitis or Crohn disease.  A type of hereditary colon cancer syndrome.  Colorectal cancer symptoms.  Types of screening tests There are several types of colorectal screening tests. They include:  Guaiac-based fecal occult blood testing.  Fecal immunochemical test (FIT).  Stool DNA test.  Barium enema.  Virtual colonoscopy.  Sigmoidoscopy. During this test, a  sigmoidoscope is used to examine your rectum and lower colon. A sigmoidoscope is a flexible tube with a camera that is inserted through your anus into your rectum and lower colon.  Colonoscopy. During this test, a colonoscope is used to examine your entire colon. A colonoscope is a long, thin, flexible tube with a camera. This test examines your entire colon and rectum.  This information is not intended to replace advice given to you by your health care provider. Make sure you discuss any questions you have with your health care provider. Document Released: 05/11/2010 Document Revised: 06/30/2016 Document Reviewed: 02/27/2014 Elsevier Interactive Patient Education  2017 [employer]

## 2017-06-22 NOTE — Assessment & Plan Note (Signed)
Hypertension has resolved No need for treatment

## 2017-08-17 ENCOUNTER — Telehealth (INDEPENDENT_AMBULATORY_CARE_PROVIDER_SITE_OTHER): Payer: Self-pay | Admitting: *Deleted

## 2017-08-17 MED ORDER — AMOXICILLIN 500 MG PO TABS: ORAL_TABLET | ORAL | 0 refills | Status: DC

## 2017-08-17 NOTE — Telephone Encounter (Signed)
2 g amoxicillin 30 mins before procedure

## 2017-08-17 NOTE — Telephone Encounter (Signed)
This is a [facility] patient 

## 2017-08-17 NOTE — Telephone Encounter (Signed)
Patient walked in requesting a antibiotic for her dentist appointment she has on Monday, per patient this will stop infection. Please advise  Patient requested for the nurse to call her today.     [facility].

## 2017-08-17 NOTE — Telephone Encounter (Signed)
See message below. Patient has appt on Monday.

## 2017-08-17 NOTE — Telephone Encounter (Signed)
Sent to her pharm. [facility]

## 2017-08-21 ENCOUNTER — Other Ambulatory Visit (INDEPENDENT_AMBULATORY_CARE_PROVIDER_SITE_OTHER): Payer: Self-pay

## 2017-08-21 MED ORDER — AMOXICILLIN 500 MG PO TABS: ORAL_TABLET | ORAL | 0 refills | Status: DC

## 2017-08-28 ENCOUNTER — Ambulatory Visit (INDEPENDENT_AMBULATORY_CARE_PROVIDER_SITE_OTHER): Payer: Medicaid Other | Admitting: Orthopaedic Surgery

## 2017-08-28 ENCOUNTER — Ambulatory Visit (INDEPENDENT_AMBULATORY_CARE_PROVIDER_SITE_OTHER): Payer: Medicaid Other

## 2017-08-28 ENCOUNTER — Ambulatory Visit (INDEPENDENT_AMBULATORY_CARE_PROVIDER_SITE_OTHER): Payer: Self-pay

## 2017-08-28 ENCOUNTER — Encounter (INDEPENDENT_AMBULATORY_CARE_PROVIDER_SITE_OTHER): Payer: Self-pay | Admitting: Orthopaedic Surgery

## 2017-08-28 DIAGNOSIS — G8929 Other chronic pain: Secondary | ICD-10-CM | POA: Diagnosis not present

## 2017-08-28 DIAGNOSIS — Z96652 Presence of left artificial knee joint: Secondary | ICD-10-CM

## 2017-08-28 DIAGNOSIS — M25562 Pain in left knee: Secondary | ICD-10-CM | POA: Diagnosis not present

## 2017-08-28 DIAGNOSIS — M25561 Pain in right knee: Secondary | ICD-10-CM

## 2017-08-28 MED ORDER — AMOXICILLIN 500 MG PO TABS: ORAL_TABLET | ORAL | 6 refills | Status: DC

## 2017-08-28 NOTE — Progress Notes (Addendum)
Office Visit Note   Patient: Catherine Patrick           Date of Birth: 03-17-1966           MRN: [id number] Visit Date: 08/28/2017              Requested by: Dessa Phi, MD [address] [place], [place] [address] PCP: Dessa Phi, MD   Assessment & Plan: Visit Diagnoses:  1. Chronic pain of both knees   2. Status post total left knee replacement     Plan: At this point she is doing very well. I would like to see her back in 6 months for recheck. Needs 3 view x-rays of bilateral knees on return. Prescription for amoxicillin for upcoming dentist visit.  Follow-Up Instructions: Return in about 6 months (around 02/25/2018).   Orders:  Orders Placed This Encounter  Procedures  . XR Knee 1-2 Views Right  . XR Knee 1-2 Views Left   Meds ordered this encounter  Medications  . amoxicillin (AMOXIL) 500 MG tablet    Sig: Take 4 tabs (2 g) amoxicillin 30 mins before procedure    Dispense:  4 tablet    Refill:  6      Procedures: No procedures performed   Clinical Data: No additional findings.   Subjective: Chief Complaint  Patient presents with  . Right Knee - Pain  . Left Knee - Pain    Patient is status post bilateral knee replacements. She is 5 months status post left total knee replacement and 9 months status post right knee replacement. Overall she is doing well. 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 She has start up stiffness in pain that is mild. Overall she is very happy.    Review of Systems  Constitutional: Negative.   HENT: Negative.   Eyes: Negative.   Respiratory: Negative.   Cardiovascular: Negative.   Endocrine: Negative.   Musculoskeletal: Negative.   Neurological: Negative.   Hematological: Negative.   Psychiatric/Behavioral: Negative.   All other systems reviewed and are negative.    Objective: Vital Signs: LMP 03/19/2014   Physical Exam  Constitutional: She is oriented to person, place, and time. She appears well-developed  and well-nourished.  Pulmonary/Chest: Effort normal.  Neurological: She is alert and oriented to person, place, and time.  Skin: Skin is warm. Capillary refill takes less than 2 seconds.  Psychiatric: She has a normal mood and affect. Her behavior is normal. Judgment and thought content normal.  Nursing note and vitals reviewed.   Ortho Exam Bilateral knee exams show fully healed surgical scars. Range of motion. Collaterals are stable. Specialty Comments:  No specialty comments available.  Imaging: Xr Knee 1-2 Views Left  Result Date: 08/28/2017 Stable total knee replacement in good alignment.   Xr Knee 1-2 Views Right  Result Date: 08/28/2017 Stable TKA    PMFS History: Patient Active Problem List   Diagnosis Date Noted  . 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 Cervical radiculopathy 09/19/2016  . Left arm pain 09/19/2016  . Left lateral ankle pain 09/30/2015  . Plantar fasciitis of left foot 12/18/2013  . 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 Nephrolithiasis 05/30/2013   Past Medical History:  Diagnosis Date  . DJD (degenerative joint disease)   . Headache   . History of blood transfusion    2 times after child birth  . History of kidney stones    passed one in  2014- 2017  . HTN (hypertension) 11/10/2016  . Hypertension   . Kidney stones     Family History  Problem Relation Age of Onset  . Cancer Neg Hx   . Diabetes Neg Hx   . Heart failure Neg Hx   . Hyperlipidemia Neg Hx   . Hypertension Neg Hx     Past Surgical History:  Procedure Laterality Date  . CESAREAN SECTION    . CESAREAN SECTION    . HERNIA REPAIR    . LIPOSUCTION  2002  . TOTAL KNEE ARTHROPLASTY Right 11/30/2016   Procedure: RIGHT TOTAL KNEE ARTHROPLASTY;  Surgeon: Tarry Kos, MD;  Location: [facility] OR;  Service: Orthopedics;  Laterality: Right;  . TOTAL KNEE ARTHROPLASTY Left 03/16/2017   Procedure: LEFT TOTAL KNEE ARTHROPLASTY;   Surgeon: Tarry Kos, MD;  Location: [facility] OR;  Service: Orthopedics;  Laterality: Left;   Social History   Occupational History  . Not on file.   Social History Main Topics  . Smoking status: Never Smoker  . Smokeless tobacco: Never Used  . Alcohol use No  . Drug use: No  . Sexual activity: Yes    Birth control/ protection: None

## 2017-09-19 ENCOUNTER — Emergency Department (HOSPITAL_COMMUNITY)
Admission: EM | Admit: 2017-09-19 | Discharge: 2017-09-19 | Disposition: A | Discharge status: Home / Self Care | Payer: Medicaid Other | Attending: Emergency Medicine | Admitting: Emergency Medicine

## 2017-09-19 ENCOUNTER — Encounter (HOSPITAL_COMMUNITY): Payer: Self-pay | Admitting: *Deleted

## 2017-09-19 ENCOUNTER — Emergency Department (HOSPITAL_COMMUNITY): Payer: Medicaid Other

## 2017-09-19 DIAGNOSIS — R0789 Other chest pain: Secondary | ICD-10-CM | POA: Insufficient documentation

## 2017-09-19 DIAGNOSIS — I10 Essential (primary) hypertension: Secondary | ICD-10-CM | POA: Insufficient documentation

## 2017-09-19 DIAGNOSIS — Z79899 Other long term (current) drug therapy: Secondary | ICD-10-CM | POA: Diagnosis not present

## 2017-09-19 LAB — BASIC METABOLIC PANEL
Anion gap: 7 (ref 5–15)
BUN: 19 mg/dL (ref 6–20)
CALCIUM: 9.5 mg/dL (ref 8.9–10.3)
CHLORIDE: 107 mmol/L (ref 101–111)
CO2: 28 mmol/L (ref 22–32)
CREATININE: 0.63 mg/dL (ref 0.44–1.00)
GFR calc Af Amer: >60 mL/min (ref >60)
GFR calc non Af Amer: >60 mL/min (ref >60)
GLUCOSE: 91 mg/dL (ref 65–99)
POTASSIUM: 3.9 mmol/L (ref 3.5–5.1)
SODIUM: 142 mmol/L (ref 135–145)

## 2017-09-19 LAB — CBC
HCT: 43.1 % (ref 36.0–46.0)
Hemoglobin: 14.1 g/dL (ref 12.0–15.0)
MCH: 29.1 pg (ref 26.0–34.0)
MCHC: 32.7 g/dL (ref 30.0–36.0)
MCV: 89 fL (ref 78.0–100.0)
PLATELETS: 215 10*3/uL (ref 150–400)
RBC: 4.84 MIL/uL (ref 3.87–5.11)
RDW: 14.4 % (ref 11.5–15.5)
WBC: 8.1 10*3/uL (ref 4.0–10.5)

## 2017-09-19 LAB — POCT I-STAT TROPONIN I: Troponin i, poc: 0 ng/mL (ref 0.00–0.08)

## 2017-09-19 LAB — TROPONIN I: Troponin I: <0.03 ng/mL (ref <0.03)

## 2017-09-19 MED ORDER — KETOROLAC TROMETHAMINE 30 MG/ML IJ SOLN
30.0000 mg | Freq: Once | INTRAMUSCULAR | Status: AC
  Administered 2017-09-19: 30 mg via INTRAMUSCULAR
  Filled 2017-09-19: qty 1

## 2017-09-19 NOTE — ED Triage Notes (Signed)
Pt complains of left sided chest pain for the past 30 minutes. Pain is intermittent. Pt states she was working when the pain started. Pt feels short of breath when the pain is worse.

## 2017-09-19 NOTE — ED Notes (Signed)
Bed: UJ81 Expected date:  Expected time:  Means of arrival:  Comments: TRI

## 2017-09-19 NOTE — ED Provider Notes (Signed)
[facility]-EMERGENCY DEPT Provider Note   CSN: [id number] Arrival date & time: 09/19/17  1035     History   Chief Complaint Chief Complaint  Patient presents with  . Chest Pain    HPI Catherine Patrick is a 51 y.o. female.  Pt presents to the ED today with left sided cp.  Sx started around 0930.  She was walking around at work when sx started.  She said pain has been intermittent.  She just had a pain on the right side, but it is gone now.  She denies any sob or n/v.  No calf pain.       Past Medical History:  Diagnosis Date  . DJD (degenerative joint disease)   . Headache   . History of blood transfusion    2 times after child birth  . History of kidney stones    passed one in 2014- 2017  . HTN (hypertension) 11/10/2016  . Hypertension   . Kidney stones     Patient Active Problem List   Diagnosis Date Noted  . 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 Cervical radiculopathy 09/19/2016  . Left arm pain 09/19/2016  . Left lateral ankle pain 09/30/2015  . Plantar fasciitis of left foot 12/18/2013  . 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 Nephrolithiasis 05/30/2013    Past Surgical History:  Procedure Laterality Date  . CESAREAN SECTION    . CESAREAN SECTION    . HERNIA REPAIR    . LIPOSUCTION  2002  . TOTAL KNEE ARTHROPLASTY Right 11/30/2016   Procedure: RIGHT TOTAL KNEE ARTHROPLASTY;  Surgeon: Tarry Kos, MD;  Location: [facility] OR;  Service: Orthopedics;  Laterality: Right;  . TOTAL KNEE ARTHROPLASTY Left 03/16/2017   Procedure: LEFT TOTAL KNEE ARTHROPLASTY;  Surgeon: Tarry Kos, MD;  Location: [facility] OR;  Service: Orthopedics;  Laterality: Left;    OB History    Gravida Para Term Preterm AB Living             3   SAB TAB Ectopic Multiple Live Births                   Home Medications    Prior to Admission medications   Medication Sig Start Date End Date Taking? Authorizing Provider    amoxicillin (AMOXIL) 500 MG tablet Take 4 tabs (2 g) amoxicillin 30 mins before procedure 08/28/17  Yes Tarry Kos, MD  chlorhexidine (PERIDEX) 0.12 % solution Use as directed 5 mLs in the mouth or throat.  09/01/17  Yes [provider]  cholecalciferol (VITAMIN D) 1000 units tablet Take 1,000 Units by mouth daily.   Yes [provider]  ibuprofen (ADVIL,MOTRIN) 600 MG tablet Take 1 tablet (600 mg total) by mouth 2 (two) times daily as needed for moderate pain. With food 06/22/17  Yes Dessa Phi, MD    Family History Family History  Problem Relation Age of Onset  . Cancer Neg Hx   . Diabetes Neg Hx   . Heart failure Neg Hx   . Hyperlipidemia Neg Hx   . Hypertension Neg Hx     Social History Social History  Substance Use Topics  . Smoking status: Never Smoker  . Smokeless tobacco: Never Used  . Alcohol use No     Allergies   Amlodipine and Pork-derived products   Review of Systems Review of Systems  Cardiovascular: Positive for chest pain.  All other systems reviewed  and are negative.    Physical Exam Updated Vital Signs BP 120/79 (BP Location: Left Arm)   Pulse 76   Temp 98 F (36.7 C)   Resp 18   LMP 03/19/2014   SpO2 97%   Physical Exam  Constitutional: She is oriented to person, place, and time. She appears well-developed and well-nourished.  HENT:  Head: Normocephalic and atraumatic.  Right Ear: External ear normal.  Left Ear: External ear normal.  Nose: Nose normal.  Mouth/Throat: Oropharynx is clear and moist.  Eyes: Pupils are equal, round, and reactive to light. Conjunctivae and EOM are normal.  Neck: Normal range of motion. Neck supple.  Cardiovascular: Normal rate, regular rhythm, normal heart sounds and intact distal pulses.   Pulmonary/Chest: Effort normal and breath sounds normal.  Abdominal: Soft. Bowel sounds are normal.  Musculoskeletal: Normal range of motion.  Neurological: She is alert and oriented to person,  place, and time.  Skin: Skin is warm. Capillary refill takes less than 2 seconds.  Psychiatric: She has a normal mood and affect. Her behavior is normal. Judgment and thought content normal.  Nursing note and vitals reviewed.    ED Treatments / Results  Labs (all labs ordered are listed, but only abnormal results are displayed) Labs Reviewed  BASIC METABOLIC PANEL  CBC  TROPONIN I  I-STAT TROPONIN, ED  POCT I-STAT TROPONIN I    EKG  EKG Interpretation  Date/Time:  Tuesday September 19 2017 10:45:53 EDT Ventricular Rate:  80 PR Interval:    QRS Duration: 70 QT Interval:  359 QTC Calculation: 415 R Axis:   14 Text Interpretation:  Sinus rhythm Atrial premature complex Abnormal R-wave progression, early transition LVH by voltage Borderline T abnormalities, inferior leads since last tracing no significant change Confirmed by Rolan Bucco [phone]) on 09/19/2017 11:01:55 AM Also confirmed by Rolan Bucco [phone]), editor Barbette Hair [phone])  on 09/19/2017 11:20:03 AM       Radiology Dg Chest 2 View  Result Date: 09/19/2017 CLINICAL DATA:  Chest pain. EXAM: CHEST  2 VIEW COMPARISON:  None. FINDINGS: The cardiomediastinal silhouette is normal in size. Normal pulmonary vascularity. No focal consolidation, pleural effusion, or pneumothorax. No acute osseous abnormality. The visualized abdomen is unremarkable. IMPRESSION: No active cardiopulmonary disease. Electronically Signed   By: Obie Dredge M.D.   On: 09/19/2017 11:30    Procedures Procedures (including critical care time)  Medications Ordered in ED Medications  ketorolac (TORADOL) 30 MG/ML injection 30 mg (30 mg Intramuscular Given 09/19/17 1650)     Initial Impression / Assessment and Plan / ED Course  I have reviewed the triage vital signs and the nursing notes.  Pertinent labs & imaging results that were available during my care of the patient were reviewed by me and considered in my medical decision making  (see chart for details).    Pt's pain is atypical.  She is very low risk for CAD.  Sx do not sound like PE.  Pt is given toradol prior to d/c.  She is instructed to f/u with pcp.  Final Clinical Impressions(s) / ED Diagnoses   Final diagnoses:  Chest wall pain    New Prescriptions Discharge Medication List as of 09/19/2017  4:27 PM       Jacalyn Lefevre, MD 09/19/17 2329

## 2017-10-23 MED FILL — IBUPROFEN 800 MG TABLET: 800 | 10 days supply | Qty: 30 | Fill #1

## 2017-10-31 ENCOUNTER — Ambulatory Visit: Payer: Medicaid Other | Admitting: Family Medicine

## 2017-12-27 ENCOUNTER — Ambulatory Visit: Payer: Medicaid Other | Attending: Internal Medicine | Admitting: Physician Assistant

## 2017-12-27 ENCOUNTER — Ambulatory Visit: Payer: Medicaid Other

## 2017-12-27 VITALS — BP 122/84 | HR 83 | Temp 98.4°F | Resp 16 | Wt 180.0 lb

## 2017-12-27 DIAGNOSIS — Z87442 Personal history of urinary calculi: Secondary | ICD-10-CM | POA: Insufficient documentation

## 2017-12-27 DIAGNOSIS — M1990 Unspecified osteoarthritis, unspecified site: Secondary | ICD-10-CM | POA: Insufficient documentation

## 2017-12-27 DIAGNOSIS — H6983 Other specified disorders of Eustachian tube, bilateral: Secondary | ICD-10-CM | POA: Insufficient documentation

## 2017-12-27 DIAGNOSIS — Z79899 Other long term (current) drug therapy: Secondary | ICD-10-CM | POA: Diagnosis not present

## 2017-12-27 DIAGNOSIS — I10 Essential (primary) hypertension: Secondary | ICD-10-CM | POA: Diagnosis not present

## 2017-12-27 DIAGNOSIS — H838X3 Other specified diseases of inner ear, bilateral: Secondary | ICD-10-CM | POA: Diagnosis present

## 2017-12-27 MED ORDER — FLUTICASONE PROPIONATE 50 MCG/ACT NA SUSP: 2.0000 | Freq: Every day | NASAL | 6 refills | Status: DC

## 2017-12-27 MED ORDER — METHYLPREDNISOLONE SODIUM SUCC 125 MG IJ SOLR
125.0000 mg | Freq: Once | INTRAMUSCULAR | Status: AC
  Administered 2017-12-27: 125 mg via INTRAMUSCULAR

## 2017-12-27 MED FILL — FLUTICASONE PROP 50 MCG SPR: 50 | 30 days supply | Qty: 16 | Fill #0

## 2017-12-27 NOTE — Progress Notes (Signed)
Patient ID: Gwynneth AlimentSulwa Berres, female   DOB: 12/09/1965, 52 y.o.   MRN: [id number]   Gwynneth AlimentSulwa Gibb, is a 52 y.o. female  ZHY:[id number]  [id number]:[id number]  DOB - 10/02/1966  Subjective:  Chief Complaint and HPI: Gwynneth AlimentSulwa Geathers is a 52 y.o. female here today for B ear congestion since flying to [place] in late December.  SOme popping in ears and decreased hearing.  No fluid coming out of ears.  No pain.  Not diabetic  ROS:   Constitutional:  No f/c, No night sweats, No unexplained weight loss. EENT:  No vision changes, No blurry vision, No hearing changes. No other mouth, throat, or ear problems.  Respiratory: No cough, No SOB Cardiac: No CP, no palpitations GI:  No abd pain, No N/V/D. GU: No Urinary s/sx Musculoskeletal: No joint pain Neuro: No headache, no dizziness, no motor weakness.  Skin: No rash Endocrine:  No polydipsia. No polyuria.  Psych: Denies SI/HI  No problems updated.  ALLERGIES: Allergies  Allergen Reactions  . Amlodipine Palpitations    WITH FIRST DOSES BEGAN AFTER EACH DOSE RESOLVED WHEN D/C'd   . Pork-Derived Products     PATIENT PREFERENCE. CULTURAL DICTATES    PAST MEDICAL HISTORY: Past Medical History:  Diagnosis Date  . DJD (degenerative joint disease)   . Headache   . History of blood transfusion    2 times after child birth  . History of kidney stones    passed one in 2014- 2017  . HTN (hypertension) 11/10/2016  . Hypertension   . Kidney stones     MEDICATIONS AT HOME: Prior to Admission medications   Medication Sig Start Date End Date Taking? Authorizing Provider  chlorhexidine (PERIDEX) 0.12 % solution Use as directed 5 mLs in the mouth or throat.  09/01/17   [provider]  cholecalciferol (VITAMIN D) 1000 units tablet Take 1,000 Units by mouth daily.    [provider]  fluticasone (FLONASE) 50 MCG/ACT nasal spray Place 2 sprays into both nostrils daily. 12/27/17   Anders SimmondsMcClung, Evolette Pendell M, PA-C     Objective:  EXAM:   Vitals:   12/27/17 1347  BP: 122/84  Pulse: 83  Resp: 16  Temp: 98.4 F (36.9 C)  TempSrc: Oral  SpO2: 99%  Weight: 180 lb (81.6 kg)    General appearance : A&OX3. NAD. Non-toxic-appearing HEENT: Atraumatic and Normocephalic.  PERRLA. EOM intact.  TM full and bulging B. No erythema/infection Mouth-MMM, post pharynx WNL w/o erythema, No PND. Neck: supple, no JVD. No cervical lymphadenopathy. No thyromegaly Chest/Lungs:  Breathing-non-labored, Good air entry bilaterally, breath sounds normal without rales, rhonchi, or wheezing  CVS: S1 S2 regular, no murmurs, gallops, rubs  Extremities: Bilateral Lower Ext shows no edema, both legs are warm to touch with = pulse throughout Neurology:  CN II-XII grossly intact, Non focal.   Psych:  TP linear. J/I WNL. Normal speech. Appropriate eye contact and affect.  Skin:  No Rash  Data Review Lab Results  Component Value Date   HGBA1C 5.4 06/11/2015   HGBA1C 5.8 (H) 01/21/2013     Assessment & Plan   1. Dysfunction of both eustachian tubes Secondary to flying/barometric pressure changes - methylPREDNISolone sodium succinate (SOLU-MEDROL) 125 mg/2 mL injection 125 mg - fluticasone (FLONASE) 50 MCG/ACT nasal spray; Place 2 sprays into both nostrils daily.  Dispense: 16 g; Refill: 6 -Phenylephrine or Sudafed 10mg  up to 3 times daily for the next 2 weeks will also help your symptoms.    Patient have been counseled  extensively about nutrition and exercise  Return if symptoms worsen or fail to improve.  The patient was given clear instructions to go to ER or return to medical center if symptoms don't improve, worsen or new problems develop. The patient verbalized understanding. The patient was told to call to get lab results if they haven't heard anything in the next week.     Georgian Co, PA-C [facility] [place], [place] [phone]   12/27/2017, 1:58 PM

## 2017-12-27 NOTE — Patient Instructions (Signed)
Phenylephrine or Sudafed 10mg  up to 3 times daily for the next 2 weeks will also help your symptoms.      Eustachian Tube Dysfunction The eustachian tube connects the middle ear to the back of the nose. It regulates air pressure in the middle ear by allowing air to move between the ear and nose. It also helps to drain fluid from the middle ear space. When the eustachian tube does not function properly, air pressure, fluid, or both can build up in the middle ear. Eustachian tube dysfunction can affect one or both ears. What are the causes? This condition happens when the eustachian tube becomes blocked or cannot open normally. This may result from:  Ear infections.  Colds and other upper respiratory infections.  Allergies.  Irritation, such as from cigarette smoke or acid from the stomach coming up into the esophagus (gastroesophageal reflux).  Sudden changes in air pressure, such as from descending in an airplane.  Abnormal growths in the nose or throat, such as nasal polyps, tumors, or enlarged tissue at the back of the throat (adenoids).  What increases the risk? This condition may be more likely to develop in people who smoke and people who are overweight. Eustachian tube dysfunction may also be more likely to develop in children, especially children who have:  Certain birth defects of the mouth, such as cleft palate.  Large tonsils and adenoids.  What are the signs or symptoms? Symptoms of this condition may include:  A feeling of fullness in the ear.  Ear pain.  Clicking or popping noises in the ear.  Ringing in the ear.  Hearing loss.  Loss of balance.  Symptoms may get worse when the air pressure around you changes, such as when you travel to an area of high elevation or fly on an airplane. How is this diagnosed? This condition may be diagnosed based on:  Your symptoms.  A physical exam of your ear, nose, and throat.  Tests, such as those that  measure: ? The movement of your eardrum (tympanogram). ? Your hearing (audiometry).  How is this treated? Treatment depends on the cause and severity of your condition. If your symptoms are mild, you may be able to relieve your symptoms by moving air into ("popping") your ears. If you have symptoms of fluid in your ears, treatment may include:  Decongestants.  Antihistamines.  Nasal sprays or ear drops that contain medicines that reduce swelling (steroids).  In some cases, you may need to have a procedure to drain the fluid in your eardrum (myringotomy). In this procedure, a small tube is placed in the eardrum to:  Drain the fluid.  Restore the air in the middle ear space.  Follow these instructions at home:  Take over-the-counter and prescription medicines only as told by your health care provider.  Use techniques to help pop your ears as recommended by your health care provider. These may include: ? Chewing gum. ? Yawning. ? Frequent, forceful swallowing. ? Closing your mouth, holding your nose closed, and gently blowing as if you are trying to blow air out of your nose.  Do not do any of the following until your health care provider approves: ? Travel to high altitudes. ? Fly in airplanes. ? Work in a Estate agent or room. ? Scuba dive.  Keep your ears dry. Dry your ears completely after showering or bathing.  Do not smoke.  Keep all follow-up visits as told by your health care provider. This is important. Contact  a health care provider if:  Your symptoms do not go away after treatment.  Your symptoms come back after treatment.  You are unable to pop your ears.  You have: ? A fever. ? Pain in your ear. ? Pain in your head or neck. ? Fluid draining from your ear.  Your hearing suddenly changes.  You become very dizzy.  You lose your balance. This information is not intended to replace advice given to you by your health care provider. Make sure you  discuss any questions you have with your health care provider. Document Released: 12/18/2015 Document Revised: 04/28/2016 Document Reviewed: 12/10/2014 Elsevier Interactive Patient Education  [employer]2018 [employer]

## 2018-06-07 IMAGING — CR DG KNEE 3 VIEWS*L*
2 series · 2 of 2 positions shown · non-contrast
Comparison: none

[knee lat]
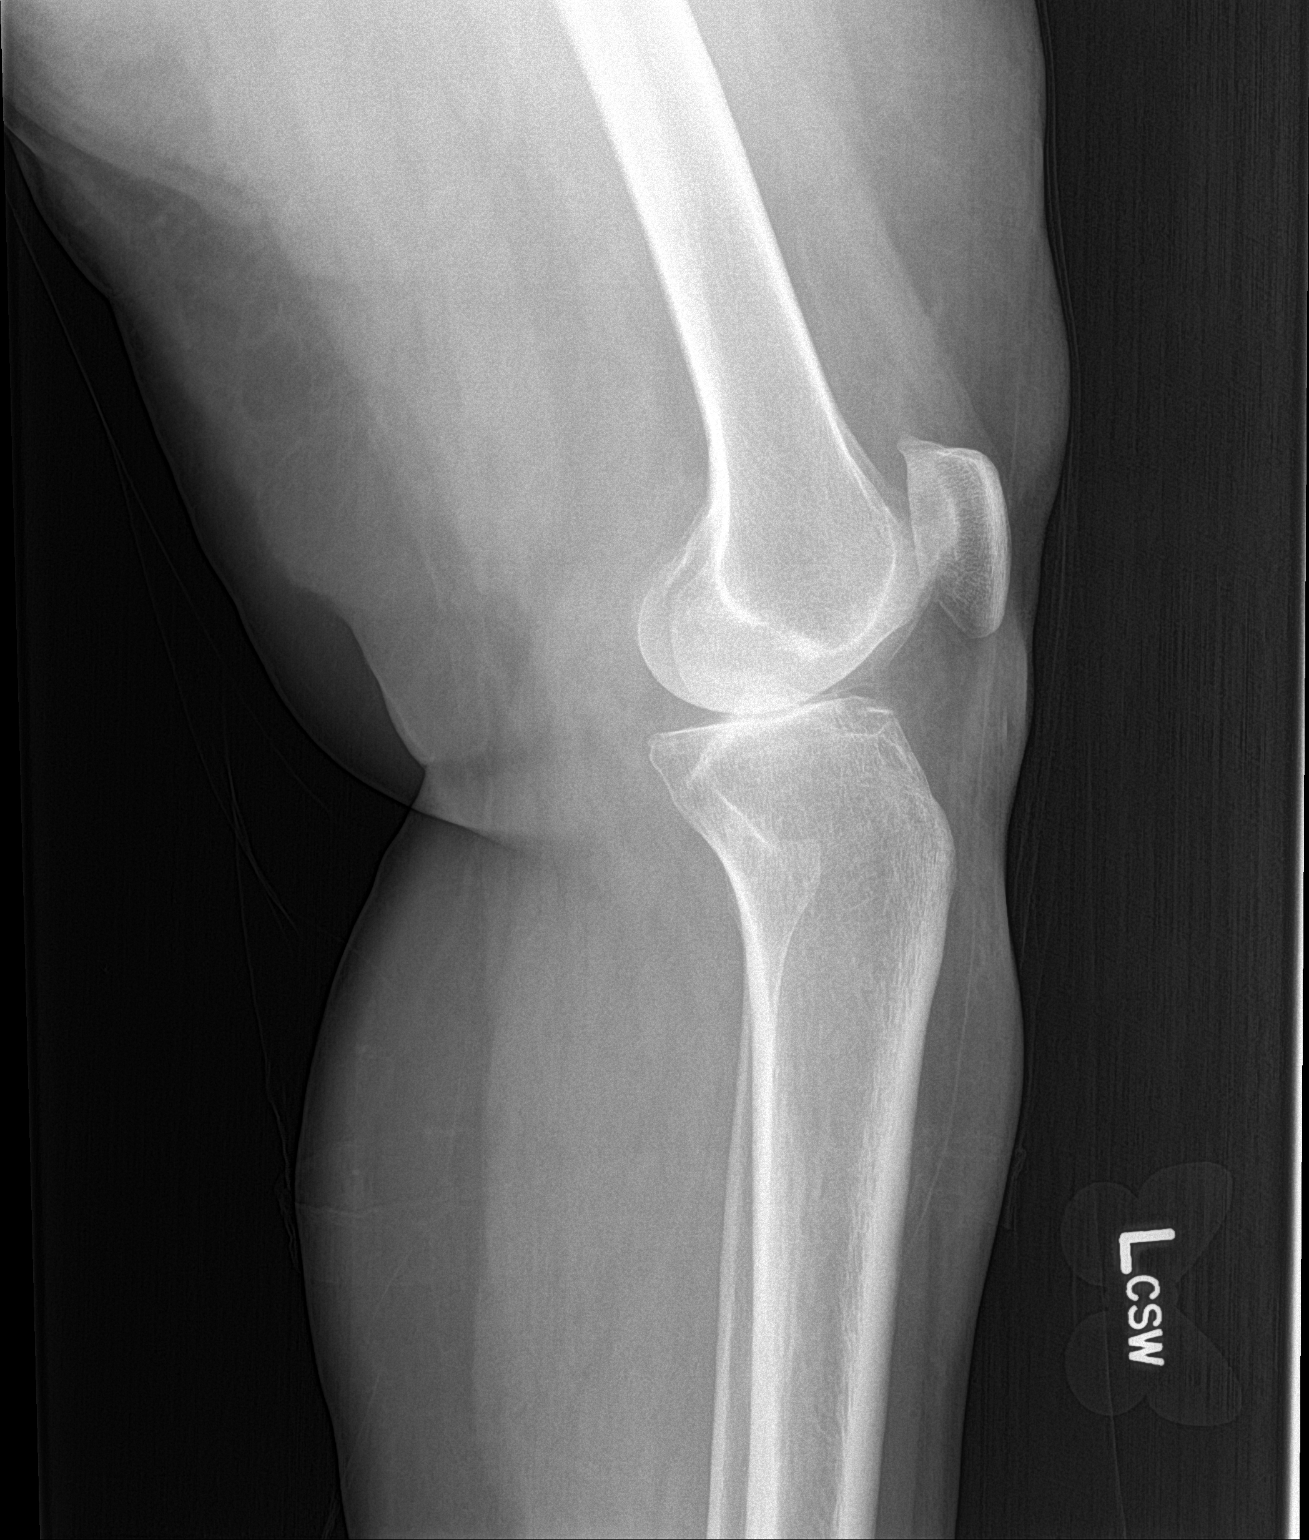

[patella sunrise]
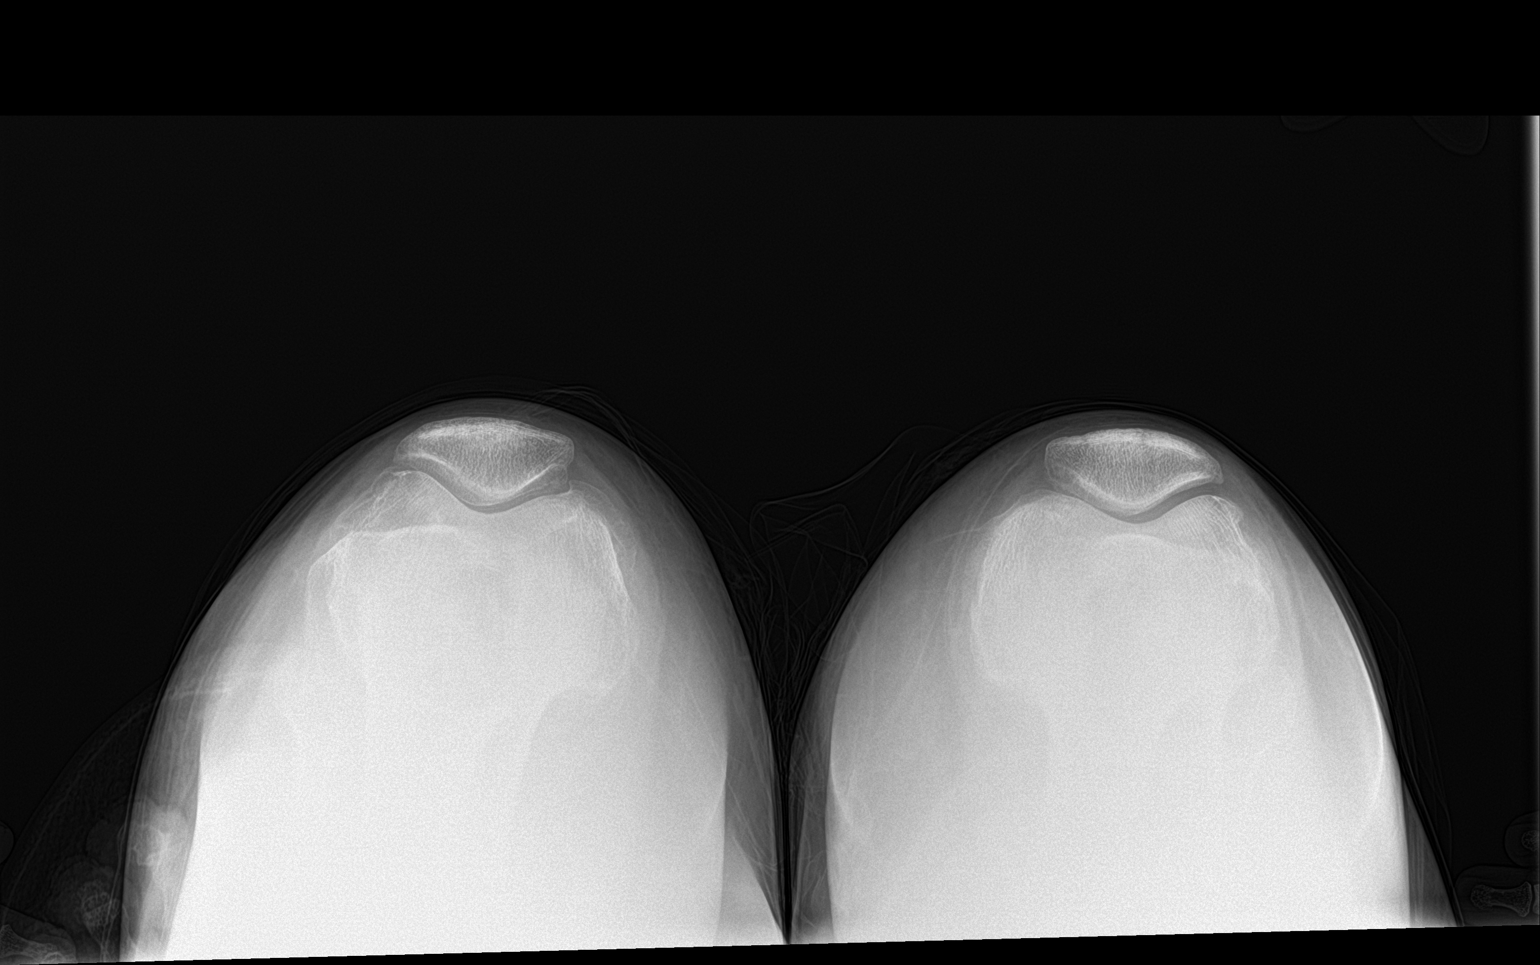

[2 of 2 positions shown; findings below may reference images not displayed]

Canned report from images found in remote index.

Refer to host system for actual result text.

## 2018-07-18 IMAGING — DX DG KNEE COMPLETE 4+V*R*
4 series · 4 of 4 positions shown · non-contrast
Comparison: 11/30/2016

CLINICAL DATA: Right knee pain, history of recent knee replacement,
initial encounter

EXAM:
RIGHT KNEE - COMPLETE 4+ VIEW

[knee ap]
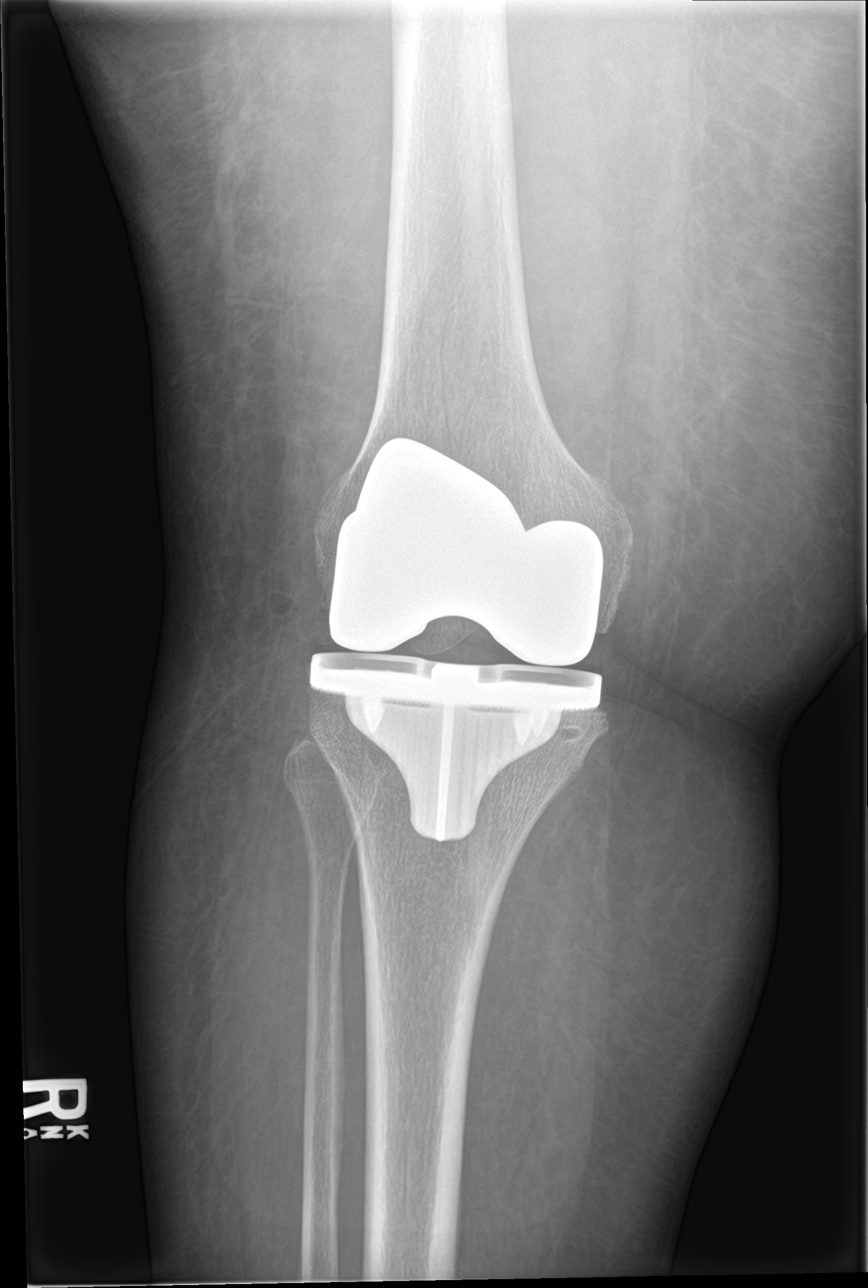

[knee lat]
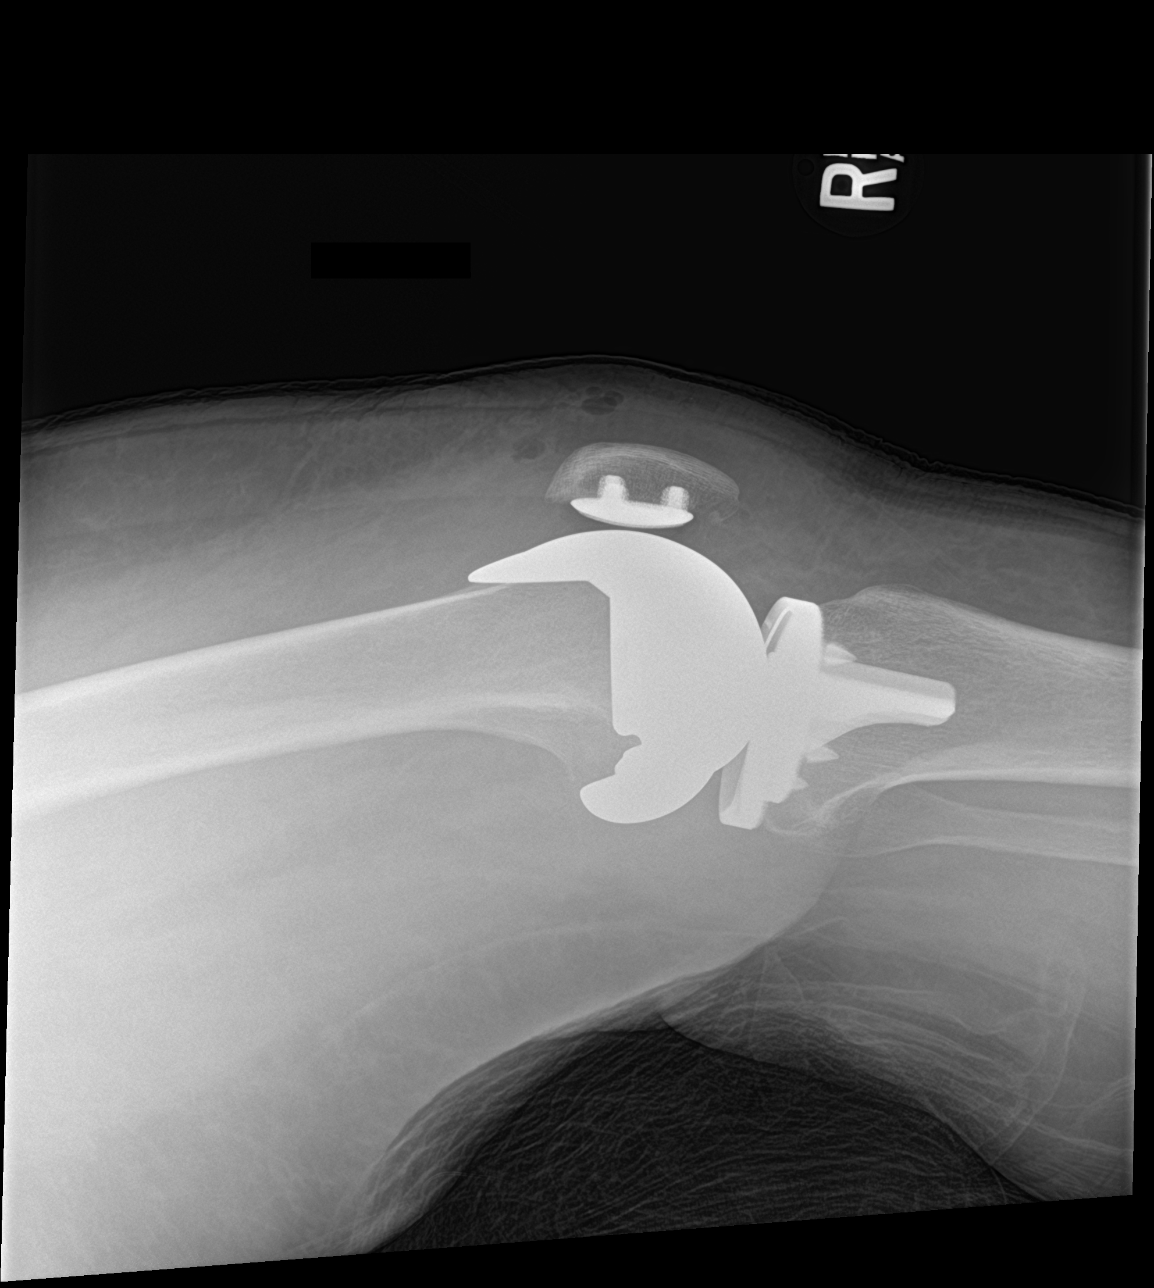

[knee obl (1 of 2)]
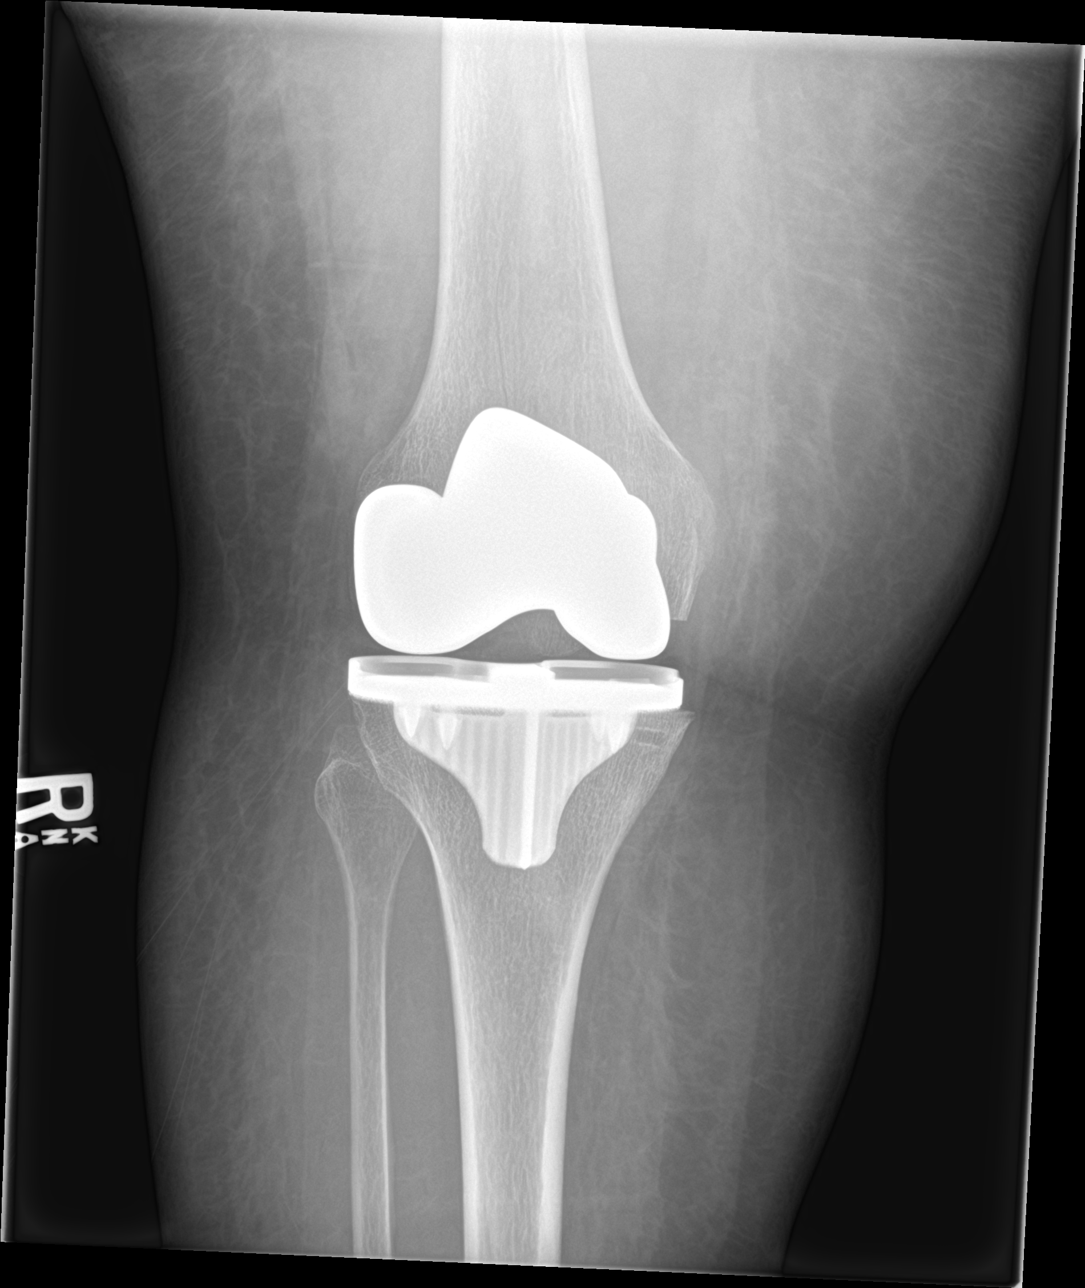

[knee obl (2 of 2)]
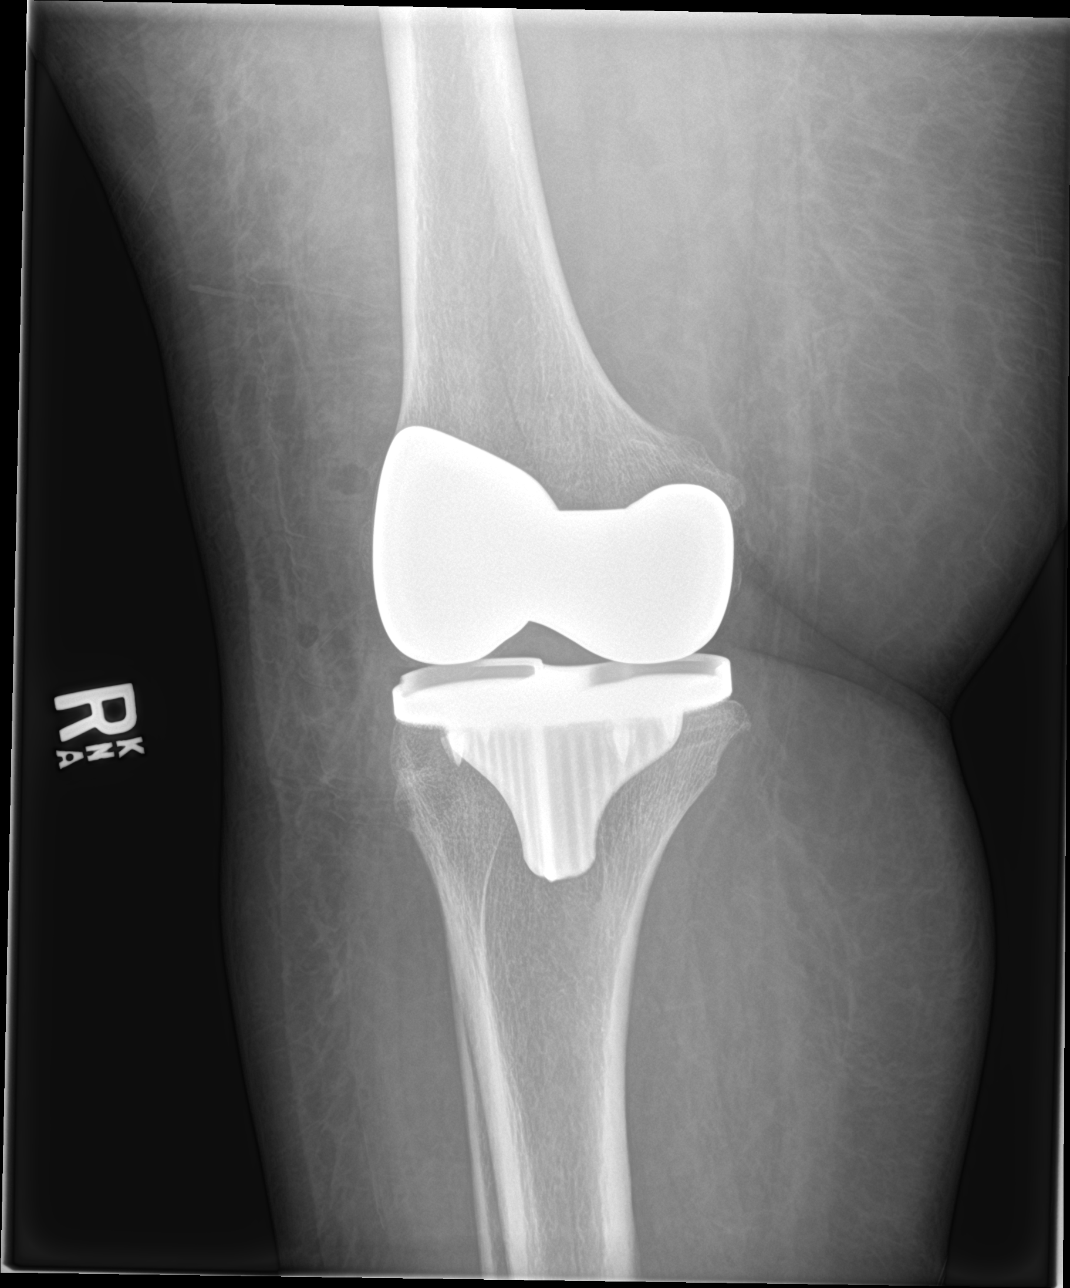

[4 of 4 positions shown; findings below may reference images not displayed]

FINDINGS: Right knee prosthesis is noted in satisfactory position. No acute
effusion is seen. No fracture is noted. A few air bubbles are noted
anterior to the patella which may be residual from the previous
surgery. The possibility of localized infection would deserve
consideration as well.
IMPRESSION: Air bubbles in the subcutaneous tissues anterior and slightly
superior to the patella. These may be related to the recent surgery
although the possibility of localized infection deserves
consideration.

## 2018-12-26 ENCOUNTER — Ambulatory Visit: Payer: Self-pay | Attending: Nurse Practitioner

## 2019-01-09 ENCOUNTER — Other Ambulatory Visit: Payer: Self-pay

## 2019-01-09 ENCOUNTER — Emergency Department (HOSPITAL_COMMUNITY)
Admission: EM | Admit: 2019-01-09 | Discharge: 2019-01-09 | Disposition: A | Discharge status: Home / Self Care | Payer: Medicaid Other | Attending: Emergency Medicine | Admitting: Emergency Medicine

## 2019-01-09 ENCOUNTER — Encounter (HOSPITAL_COMMUNITY): Payer: Self-pay

## 2019-01-09 DIAGNOSIS — Z96653 Presence of artificial knee joint, bilateral: Secondary | ICD-10-CM | POA: Insufficient documentation

## 2019-01-09 DIAGNOSIS — R42 Dizziness and giddiness: Secondary | ICD-10-CM | POA: Insufficient documentation

## 2019-01-09 DIAGNOSIS — Z79899 Other long term (current) drug therapy: Secondary | ICD-10-CM | POA: Insufficient documentation

## 2019-01-09 DIAGNOSIS — I10 Essential (primary) hypertension: Secondary | ICD-10-CM | POA: Insufficient documentation

## 2019-01-09 MED ORDER — LORAZEPAM 1 MG PO TABS
1.0000 mg | ORAL_TABLET | Freq: Once | ORAL | Status: AC
  Administered 2019-01-09: 1 mg via ORAL
  Filled 2019-01-09: qty 1

## 2019-01-09 MED ORDER — MECLIZINE HCL 12.5 MG PO TABS: 12.5000 mg | ORAL_TABLET | Freq: Four times a day (QID) | ORAL | 0 refills | Status: DC | PRN

## 2019-01-09 MED ORDER — MECLIZINE HCL 25 MG PO TABS
25.0000 mg | ORAL_TABLET | Freq: Once | ORAL | Status: AC
  Administered 2019-01-09: 25 mg via ORAL
  Filled 2019-01-09: qty 1

## 2019-01-09 NOTE — ED Notes (Signed)
ED Provider at bedside. 

## 2019-01-09 NOTE — ED Notes (Signed)
This Clinical research associate wheeled patient to restroom to obtain sample.

## 2019-01-09 NOTE — ED Triage Notes (Signed)
Pt states that she has been dizzy for 2 days. Pt states that it has gotten worse and her "head feels like a magnet".

## 2019-01-09 NOTE — ED Provider Notes (Addendum)
[facility]-EMERGENCY DEPT Provider Note   CSN: [id number] Arrival date & time: 01/09/19  0827     History   Chief Complaint Chief Complaint  Patient presents with  . Dizziness    HPI Catherine Patrick is a 53 y.o. female.  HPI   53 year old female with dizziness.  Onset yesterday.  She describes sensation of being off balance.  Feels okay at rest.  Worse with movement.  Sometimes when she goes to sit up from a laying position she feels like "a magnet is pulling me back."  Symptoms yesterday were milder and then resolved before returning again in the evening.  She denies any acute pain. No loss of hearing. No tinnitus.  No acute visual changes.  No acute numbness, tingling or focal loss of strength.  She was recently seen and diagnosed with eustachian tube dysfunction.  She received a shot of steroids, Flonase and antihistamines.  She states that those symptoms of pressure and popping had resolved.  Past Medical History:  Diagnosis Date  . DJD (degenerative joint disease)   . Headache   . History of blood transfusion    2 times after child birth  . History of kidney stones    passed one in 2014- 2017  . HTN (hypertension) 11/10/2016  . Hypertension   . Kidney stones     Patient Active Problem List   Diagnosis Date Noted  . 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 Cervical radiculopathy 09/19/2016  . Left arm pain 09/19/2016  . Left lateral ankle pain 09/30/2015  . Plantar fasciitis of left foot 12/18/2013  . 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 Nephrolithiasis 05/30/2013    Past Surgical History:  Procedure Laterality Date  . CESAREAN SECTION    . CESAREAN SECTION    . HERNIA REPAIR    . LIPOSUCTION  2002  . TOTAL KNEE ARTHROPLASTY Right 11/30/2016   Procedure: RIGHT TOTAL KNEE ARTHROPLASTY;  Surgeon: Tarry Kos, MD;  Location: [facility] OR;  Service: Orthopedics;  Laterality: Right;  . TOTAL KNEE  ARTHROPLASTY Left 03/16/2017   Procedure: LEFT TOTAL KNEE ARTHROPLASTY;  Surgeon: Tarry Kos, MD;  Location: [facility] OR;  Service: Orthopedics;  Laterality: Left;     OB History    Gravida      Para      Term      Preterm      AB      Living  3     SAB      TAB      Ectopic      Multiple      Live Births               Home Medications    Prior to Admission medications   Medication Sig Start Date End Date Taking? Authorizing Provider  cholecalciferol (VITAMIN D) 1000 units tablet Take 1,000 Units by mouth daily.   Yes [provider]  ibuprofen (ADVIL,MOTRIN) 200 MG tablet Take 600 mg by mouth every 6 (six) hours as needed for headache or moderate pain.   Yes [provider]  fluticasone (FLONASE) 50 MCG/ACT nasal spray Place 2 sprays into both nostrils daily. Patient not taking: Reported on 01/09/2019 12/27/17   Anders Simmonds, PA-C    Family History Family History  Problem Relation Age of Onset  . Cancer Neg Hx   . Diabetes Neg Hx   . Heart failure Neg Hx   .  Hyperlipidemia Neg Hx   . Hypertension Neg Hx     Social History Social History   Tobacco Use  . Smoking status: Never Smoker  . Smokeless tobacco: Never Used  Substance Use Topics  . Alcohol use: No  . Drug use: No     Allergies   Amlodipine and Pork-derived products   Review of Systems Review of Systems  All systems reviewed and negative, other than as noted in HPI.  Physical Exam Updated Vital Signs BP (!) 155/100 (BP Location: Right Arm)   Pulse 76   Temp 97.8 F (36.6 C) (Oral)   Resp 18   Ht 4\' 9"  (1.448 m)   Wt 81.6 kg   LMP 03/19/2014   SpO2 98%   BMI 38.95 kg/m   Physical Exam Vitals signs and nursing note reviewed.  Constitutional:      General: She is not in acute distress.    Appearance: She is well-developed.  HENT:     Head: Normocephalic and atraumatic.  Eyes:     General:        Right eye: No discharge.        Left eye: No discharge.      Extraocular Movements: Extraocular movements intact.     Conjunctiva/sclera: Conjunctivae normal.     Pupils: Pupils are equal, round, and reactive to light.  Neck:     Musculoskeletal: Neck supple.  Cardiovascular:     Rate and Rhythm: Normal rate and regular rhythm.     Heart sounds: Normal heart sounds. No murmur. No friction rub. No gallop.   Pulmonary:     Effort: Pulmonary effort is normal. No respiratory distress.     Breath sounds: Normal breath sounds.  Abdominal:     General: There is no distension.     Palpations: Abdomen is soft.     Tenderness: There is no abdominal tenderness.  Musculoskeletal:        General: No tenderness.  Skin:    General: Skin is warm and dry.  Neurological:     Mental Status: She is alert.     Cranial Nerves: No cranial nerve deficit.     Sensory: No sensory deficit.     Motor: No weakness.     Comments: Speech clear.  Content appropriate.  Following commands.  Cranial nerves II through XII intact.  Horizontal nystagmus with fast phase to the left.  Strength normal in all extremities.  Sensation intact to light touch.  She had good finger-nose testing bilaterally and was able to heel-to-shin without difficulty.  When I sat her up in bed she initially seemed fine for a few seconds and then forcefully laid back down almost like she was throwing herself back in the bed. She was not willing to attempt to sit back up. Did not ambulate.   Psychiatric:        Behavior: Behavior normal.        Thought Content: Thought content normal.      ED Treatments / Results  Labs (all labs ordered are listed, but only abnormal results are displayed) Labs Reviewed - No data to display  EKG EKG Interpretation  Date/Time:  Wednesday January 09 2019 08:52:43 EST Ventricular Rate:  69 PR Interval:    QRS Duration: 83 QT Interval:  391 QTC Calculation: 419 R Axis:   7 Text Interpretation:  Sinus rhythm Atrial premature complexes Left ventricular  hypertrophy Tall R wave in V2, consider RVH or PMI Borderline T abnormalities, inferior leads  Confirmed by Raeford RazorKohut, Bluford Sedler [phone] on 01/09/2019 9:35:27 AM   Radiology No results found.  Procedures Procedures (including critical care time)  Medications Ordered in ED Medications  LORazepam (ATIVAN) tablet 1 mg (has no administration in time range)  meclizine (ANTIVERT) tablet 25 mg (has no administration in time range)     Initial Impression / Assessment and Plan / ED Course  I have reviewed the triage vital signs and the nursing notes.  Pertinent labs & imaging results that were available during my care of the patient were reviewed by me and considered in my medical decision making (see chart for details).     53 year old female with what I suspect is peripheral vertigo.  Positional.  Reproducible.  Seemed to have a latent period when sitting her up. Horizontal nystagmus noted on exam.  Recent diagnosis of eustachian tube dysfunction. Finger-to-nose and heel-to-shin testing are normal.  She denies any headaches or acute visual changes.  I doubt central cause.  Plan to treat her symptoms and ambulate prior to DC. Continued PRN meds and instructions for Epley maneuvers. Return precautions discussed.   Final Clinical Impressions(s) / ED Diagnoses   Final diagnoses:  Vertigo    ED Discharge Orders    None        Raeford RazorKohut, Kaya Klausing, MD 01/09/19 [phone]

## 2019-01-28 ENCOUNTER — Encounter: Payer: Self-pay | Admitting: Family Medicine

## 2019-01-28 ENCOUNTER — Ambulatory Visit: Payer: Self-pay | Attending: Family Medicine | Admitting: Family Medicine

## 2019-01-28 VITALS — BP 128/81 | HR 83 | Temp 98.3°F | Resp 18 | Ht <= 58 in | Wt 182.0 lb

## 2019-01-28 DIAGNOSIS — Z23 Encounter for immunization: Secondary | ICD-10-CM

## 2019-01-28 DIAGNOSIS — H6983 Other specified disorders of Eustachian tube, bilateral: Secondary | ICD-10-CM

## 2019-01-28 DIAGNOSIS — J309 Allergic rhinitis, unspecified: Secondary | ICD-10-CM | POA: Insufficient documentation

## 2019-01-28 DIAGNOSIS — H6993 Unspecified Eustachian tube disorder, bilateral: Secondary | ICD-10-CM

## 2019-01-28 DIAGNOSIS — R42 Dizziness and giddiness: Secondary | ICD-10-CM | POA: Insufficient documentation

## 2019-01-28 MED ORDER — PREDNISONE 20 MG PO TABS: ORAL_TABLET | ORAL | 0 refills | Status: DC

## 2019-01-28 MED ORDER — MECLIZINE HCL 12.5 MG PO TABS: 12.5000 mg | ORAL_TABLET | Freq: Four times a day (QID) | ORAL | 1 refills | Status: DC | PRN

## 2019-01-28 NOTE — Patient Instructions (Addendum)

## 2019-01-28 NOTE — Progress Notes (Signed)
Patient complains of dizziness, increasing when she lays down at night and sits up in the morning. Orthostatics were completed in office, patient become dizzy in between each interval.

## 2019-01-28 NOTE — Progress Notes (Signed)
Subjective:    Patient ID: Catherine Patrick, female    DOB: 1966/07/09, 53 y.o.   MRN: [id number]  HPI        53 yo female who is seen status post emergency department visit on 01/09/2019 due to onset of dizziness on the day of the emergency department visit.Patient reports that she continues to have some dizziness but this is slowly improving.  Patient however with recurrent ear pressure and popping sensation in her ears.  Patient states that when she was last here in the office she received a steroid shot which temporarily helped with the sensation of her ears popping and pressure sensation however this has now returned.  Patient did feel that the medication prescribed for her dizziness in the emergency department has helped.  Patient has had no falls.  Patient states that when the dizziness does occur, she will feel off balance and as if the room is spinning.  Patient states that the dizziness mostly occurs when she is up and walking but she is also had some dizziness with changes of position while lying down.  She does feel as if the dizziness has lessened.  She denies any fever or chills.  She does have some sensation of nasal congestion and postnasal drainage.  No sore throat.  Patient has had no cough and no shortness of breath.  Patient has had no discolored nasal discharge.  Patient denies any loss of hearing or decreased hearing.  Patient has had no facial weakness or numbness.  No numbness in the arms or legs.  On the day of her emergency department visit, patient was having some nausea with her dizziness but this has resolved.  Past Medical History:  Diagnosis Date  . DJD (degenerative joint disease)   . Headache   . History of blood transfusion    2 times after child birth  . History of kidney stones    passed one in 2014- 2017  . HTN (hypertension) 11/10/2016  . Hypertension   . Kidney stones    Past Surgical History:  Procedure Laterality Date  . CESAREAN SECTION    . CESAREAN SECTION     . HERNIA REPAIR    . LIPOSUCTION  2002  . TOTAL KNEE ARTHROPLASTY Right 11/30/2016   Procedure: RIGHT TOTAL KNEE ARTHROPLASTY;  Surgeon: Tarry Kos, MD;  Location: [facility] OR;  Service: Orthopedics;  Laterality: Right;  . TOTAL KNEE ARTHROPLASTY Left 03/16/2017   Procedure: LEFT TOTAL KNEE ARTHROPLASTY;  Surgeon: Tarry Kos, MD;  Location: [facility] OR;  Service: Orthopedics;  Laterality: Left;   Family History  Problem Relation Age of Onset  . Cancer Neg Hx   . Diabetes Neg Hx   . Heart failure Neg Hx   . Hyperlipidemia Neg Hx   . Hypertension Neg Hx    Social History   Tobacco Use  . Smoking status: Never Smoker  . Smokeless tobacco: Never Used  Substance Use Topics  . Alcohol use: No  . Drug use: No   Allergies  Allergen Reactions  . Amlodipine Palpitations    WITH FIRST DOSES BEGAN AFTER EACH DOSE RESOLVED WHEN D/C'd   . Pork-Derived Products     PATIENT PREFERENCE. CULTURAL DICTATES   Current Outpatient Medications on File Prior to Visit  Medication Sig Dispense Refill  . cholecalciferol (VITAMIN D) 1000 units tablet Take 1,000 Units by mouth daily.    Marland Kitchen ibuprofen (ADVIL,MOTRIN) 200 MG tablet Take 600 mg by mouth every 6 (six) hours  as needed for headache or moderate pain.    . fluticasone (FLONASE) 50 MCG/ACT nasal spray Place 2 sprays into both nostrils daily. (Patient not taking: Reported on 01/09/2019) 16 g 6   No current facility-administered medications on file prior to visit.       Review of Systems  Constitutional: Negative for chills, fatigue and fever.  HENT: Positive for congestion and postnasal drip. Negative for ear pain, rhinorrhea, sinus pressure, sinus pain, sore throat and trouble swallowing.   Eyes: Negative for photophobia and visual disturbance.  Respiratory: Negative for cough and shortness of breath.   Cardiovascular: Negative for chest pain, palpitations and leg swelling.  Gastrointestinal: Negative for abdominal pain, diarrhea and nausea.    Endocrine: Negative for polydipsia, polyphagia and polyuria.  Genitourinary: Negative for dysuria and frequency.  Neurological: Positive for dizziness. Negative for facial asymmetry, weakness, numbness and headaches.  Hematological: Negative for adenopathy. Does not bruise/bleed easily.       Objective:   Physical Exam BP 128/81 (BP Location: Left Arm, Patient Position: Sitting, Cuff Size: Large)   Pulse 83   Temp 98.3 F (36.8 C) (Oral)   Resp 18   Ht  (1.448 m)   Wt 182 lb (82.6 kg)   LMP 03/19/2014   SpO2 95%   BMI 39.38 kg/m Nurse's notes and vital signs reviewed General-well-nourished, well-developed overweight for height female in no acute distress ENT- TMs dull and slightly retracted, nares with mild edema/erythema of the nasal turbinates with clear and white mucoid nasal discharge, patient with posterior pharynx/tonsillar arch edema/erythema and mild cobblestoning Neck-supple, no lymphadenopathy Lungs-clear auscultation bilaterally Cardiovascular-regular rate and regular rhythm Abdomen-soft, nontender Back-no CVA tenderness Extremities-no edema Neuro- cranial nerves II through XII are grossly intact and patient does not have any evidence of nystagmus on today's exam but does complain of dizziness with testing for nystagmus        Assessment & Plan:  1. Dizziness Patient with complaint of some recent onset of dizziness but patient also with findings of nasal congestion and eustachian tube dysfunction which may be a contributing factor.  Patient does feel that the dizziness has improved since her recent emergency department visit.  Patient requests refill of meclizine which was sent to patient's pharmacy.  Patient will also be placed on a prednisone taper and will check basic metabolic panel and CBC to look for any electrolyte abnormality or anemia which might be contributing to her dizziness. - predniSONE (DELTASONE) 20 MG tablet; Take 2 pills today then 1 pill daily  x 2 days then 1/2 pill daily x 4 days; take after eating  Dispense: 6 tablet; Refill: 0 - Basic Metabolic Panel - CBC with Differential - meclizine (ANTIVERT) 12.5 MG tablet; Take 1 tablet (12.5 mg total) by mouth 4 (four) times daily as needed for dizziness.  Dispense: 45 tablet; Refill: 1  2. Allergic rhinitis, unspecified seasonality, unspecified trigger Patient with complaint of allergic rhinitis symptoms, nasal congestion and postnasal drainage.  Patient was prescribed Flonase at her office visit on 12/27/2017.  Patient is also encouraged to take an over-the-counter antihistamine such as loratadine and she may also take Benadryl at bedtime while on the prednisone taper to help with nasal congestion and postnasal drainage. - predniSONE (DELTASONE) 20 MG tablet; Take 2 pills today then 1 pill daily x 2 days then 1/2 pill daily x 4 days; take after eating  Dispense: 6 tablet; Refill: 0  3. Dysfunction of both eustachian tubes Patient with eustachian tube dysfunction  which was discussed with the patient at today's visit and information on eustachian tube dysfunction was provided to the patient as part of her AVS.  Patient is being placed on a prednisone taper and is encouraged to take an over-the-counter antihistamine and continue the use of Flonase which she was prescribed at her prior visit.   4. Need for immunization against influenza Patient was offered and agreed to have influenza immunization at today's visit.  Patient was also provided with educational handout regarding influenza immunization - Flu Vaccine QUAD 36+ mos IM  An After Visit Summary was printed and given to the patient.  Allergies as of 01/28/2019      Reactions   Amlodipine Palpitations   WITH FIRST DOSES BEGAN AFTER EACH DOSE RESOLVED WHEN D/C'd    Pork-derived Products    PATIENT PREFERENCE. CULTURAL DICTATES      Medication List       Accurate as of January 28, 2019 11:59 PM. Always use your most recent med  list.        cholecalciferol 1000 units tablet Commonly known as:  VITAMIN D Take 1,000 Units by mouth daily.   fluticasone 50 MCG/ACT nasal spray Commonly known as:  FLONASE Place 2 sprays into both nostrils daily.   ibuprofen 200 MG tablet Commonly known as:  ADVIL,MOTRIN Take 600 mg by mouth every 6 (six) hours as needed for headache or moderate pain.   meclizine 12.5 MG tablet Commonly known as:  ANTIVERT Take 1 tablet (12.5 mg total) by mouth 4 (four) times daily as needed for dizziness.   predniSONE 20 MG tablet Commonly known as:  DELTASONE Take 2 pills today then 1 pill daily x 2 days then 1/2 pill daily x 4 days; take after eating      Return in about 2 weeks (around 02/11/2019) for dizziness- 2 weeks if not better.

## 2019-01-29 LAB — BASIC METABOLIC PANEL WITH GFR
BUN/Creatinine Ratio: 21 (ref 9–23)
BUN: 15 mg/dL (ref 6–24)
CO2: 18 mmol/L — ABNORMAL LOW (ref 20–29)
Calcium: 9.3 mg/dL (ref 8.7–10.2)
Chloride: 108 mmol/L — ABNORMAL HIGH (ref 96–106)
Creatinine, Ser: 0.7 mg/dL (ref 0.57–1.00)
GFR calc Af Amer: 114 mL/min/1.73
GFR calc non Af Amer: 99 mL/min/1.73
Glucose: 88 mg/dL (ref 65–99)
Potassium: 4.4 mmol/L (ref 3.5–5.2)
Sodium: 144 mmol/L (ref 134–144)

## 2019-01-29 LAB — CBC WITH DIFFERENTIAL/PLATELET
Basophils Absolute: 0 x10E3/uL (ref 0.0–0.2)
Basos: 0 %
EOS (ABSOLUTE): 0.1 x10E3/uL (ref 0.0–0.4)
Eos: 2 %
Hematocrit: 41.5 % (ref 34.0–46.6)
Hemoglobin: 13.6 g/dL (ref 11.1–15.9)
Immature Grans (Abs): 0 x10E3/uL (ref 0.0–0.1)
Immature Granulocytes: 0 %
Lymphocytes Absolute: 3.9 x10E3/uL — ABNORMAL HIGH (ref 0.7–3.1)
Lymphs: 43 %
MCH: 29.2 pg (ref 26.6–33.0)
MCHC: 32.8 g/dL (ref 31.5–35.7)
MCV: 89 fL (ref 79–97)
Monocytes Absolute: 0.7 x10E3/uL (ref 0.1–0.9)
Monocytes: 8 %
Neutrophils Absolute: 4.3 x10E3/uL (ref 1.4–7.0)
Neutrophils: 47 %
Platelets: 222 x10E3/uL (ref 150–450)
RBC: 4.66 x10E6/uL (ref 3.77–5.28)
RDW: 13.8 % (ref 11.7–15.4)
WBC: 9.1 x10E3/uL (ref 3.4–10.8)

## 2019-01-30 ENCOUNTER — Ambulatory Visit: Payer: Self-pay | Attending: Family Medicine

## 2019-02-13 ENCOUNTER — Telehealth: Payer: Self-pay | Admitting: *Deleted

## 2019-02-13 NOTE — Telephone Encounter (Signed)
Patient verified DOB Patient is aware of labs being normal. No further questions. 

## 2019-02-13 NOTE — Telephone Encounter (Signed)
-----   Message from Cain Saupe, MD sent at 01/29/2019  6:57 AM EST ----- Please notify patient of normal CBC and normal BMP

## 2019-02-22 ENCOUNTER — Ambulatory Visit: Payer: Medicaid Other | Admitting: Family Medicine

## 2019-07-14 ENCOUNTER — Encounter (HOSPITAL_COMMUNITY): Payer: Self-pay

## 2019-07-14 ENCOUNTER — Emergency Department (HOSPITAL_COMMUNITY): Payer: Medicaid Other

## 2019-07-14 ENCOUNTER — Other Ambulatory Visit: Payer: Self-pay

## 2019-07-14 ENCOUNTER — Emergency Department (HOSPITAL_COMMUNITY)
Admission: EM | Admit: 2019-07-14 | Discharge: 2019-07-14 | Disposition: A | Discharge status: Home / Self Care | Payer: Medicaid Other | Attending: Emergency Medicine | Admitting: Emergency Medicine

## 2019-07-14 DIAGNOSIS — I10 Essential (primary) hypertension: Secondary | ICD-10-CM | POA: Insufficient documentation

## 2019-07-14 DIAGNOSIS — Z96653 Presence of artificial knee joint, bilateral: Secondary | ICD-10-CM | POA: Insufficient documentation

## 2019-07-14 DIAGNOSIS — Z79899 Other long term (current) drug therapy: Secondary | ICD-10-CM | POA: Insufficient documentation

## 2019-07-14 DIAGNOSIS — M25472 Effusion, left ankle: Secondary | ICD-10-CM

## 2019-07-14 NOTE — Discharge Instructions (Addendum)
You were seen in the ER for ankle swelling  Wear your ASO brace for the next 7 -10 days to help with swelling, stabilization and pain  Ice for 20 mins at a time at least 3-4 times a day. Elevate your foot.  Try to wear sneakers or other stable shoes to prevent another twist  For pain and inflammation you can use a combination of ibuprofen and acetaminophen.  Take [phone] mg acetaminophen (tylenol) every 6 hours or 600 mg ibuprofen (advil, motrin) every 6 hours.  You can take these separately or combine them every 6 hours for maximum pain control. Do not exceed 4,000 mg acetaminophen or 2,400 mg ibuprofen in a 24 hour period.  Do not take ibuprofen containing products if you have history of kidney disease, ulcers, GI bleeding, severe acid reflux, or take a blood thinner.  Do not take acetaminophen if you have liver disease.

## 2019-07-14 NOTE — ED Triage Notes (Signed)
C/o left ankel pain since Thursday.   Patient states she often twists her ankle "sometimes"    Pedal pulses present.  Left ankle is slight more swollen then right.   A/ox4 Ambulatory in triage.

## 2019-07-14 NOTE — ED Provider Notes (Signed)
[facility]-EMERGENCY DEPT Provider Note   CSN: [id number] Arrival date & time: 07/14/19  1534     History   Chief Complaint Chief Complaint  Patient presents with  . Ankle Pain    HPI Catherine Patrick is a 53 y.o. female presents to the ER for evaluation of left ankle pain.  Onset on Thursday.  Associated with swelling, difficulty walking.  Catherine Patrick works at a daycare and states that occasionally, Catherine Patrick trips over her children and will roll her ankle.  Also the last week Catherine Patrick has been walking and has stepped on some pine cones that have caused some twisting in her ankle.  No interventions at home.  Aggravated with palpation, flexion extension of ankle, weightbearing.  Slightly alleviated if Catherine Patrick rests and does not put weight on it.  Denies any known previous injuries, fractures, dislocations, surgeries or recent injections.  Catherine Patrick denies any distal tingling, loss of sensation.  No proximal calf pain or swelling.  No skin redness, warmth.  No fevers.    HPI  Past Medical History:  Diagnosis Date  . DJD (degenerative joint disease)   . Headache   . History of blood transfusion    2 times after child birth  . History of kidney stones    passed one in 2014- 2017  . HTN (hypertension) 11/10/2016  . Hypertension   . Kidney stones     Patient Active Problem List   Diagnosis Date Noted  . Dizziness 01/28/2019  . Allergic rhinitis 01/28/2019  . 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 Cervical radiculopathy 09/19/2016  . Left arm pain 09/19/2016  . Left lateral ankle pain 09/30/2015  . Plantar fasciitis of left foot 12/18/2013  . 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 Nephrolithiasis 05/30/2013    Past Surgical History:  Procedure Laterality Date  . CESAREAN SECTION    . CESAREAN SECTION    . HERNIA REPAIR    . LIPOSUCTION  2002  . TOTAL KNEE ARTHROPLASTY Right 11/30/2016   Procedure: RIGHT TOTAL KNEE ARTHROPLASTY;   Surgeon: Tarry KosNaiping M Xu, MD;  Location: [facility] OR;  Service: Orthopedics;  Laterality: Right;  . TOTAL KNEE ARTHROPLASTY Left 03/16/2017   Procedure: LEFT TOTAL KNEE ARTHROPLASTY;  Surgeon: Tarry KosNaiping M Xu, MD;  Location: [facility] OR;  Service: Orthopedics;  Laterality: Left;     OB History    Gravida      Para      Term      Preterm      AB      Living  3     SAB      TAB      Ectopic      Multiple      Live Births               Home Medications    Prior to Admission medications   Medication Sig Start Date End Date Taking? Authorizing Provider  cholecalciferol (VITAMIN D) 1000 units tablet Take 1,000 Units by mouth daily.    [provider]  fluticasone (FLONASE) 50 MCG/ACT nasal spray Place 2 sprays into both nostrils daily. Patient not taking: Reported on 01/09/2019 12/27/17   Anders SimmondsMcClung, Angela M, PA-C  ibuprofen (ADVIL,MOTRIN) 200 MG tablet Take 600 mg by mouth every 6 (six) hours as needed for headache or moderate pain.    [provider]  meclizine (ANTIVERT) 12.5 MG tablet Take 1 tablet (12.5 mg total) by mouth 4 (four)  times daily as needed for dizziness. 01/28/19   Fulp, Cammie, MD  predniSONE (DELTASONE) 20 MG tablet Take 2 pills today then 1 pill daily x 2 days then 1/2 pill daily x 4 days; take after eating 01/28/19   Antony Blackbird, MD    Family History Family History  Problem Relation Age of Onset  . Cancer Neg Hx   . Diabetes Neg Hx   . Heart failure Neg Hx   . Hyperlipidemia Neg Hx   . Hypertension Neg Hx     Social History Social History   Tobacco Use  . Smoking status: Never Smoker  . Smokeless tobacco: Never Used  Substance Use Topics  . Alcohol use: No  . Drug use: No     Allergies   Amlodipine and Pork-derived products   Review of Systems Review of Systems  Musculoskeletal: Positive for arthralgias, gait problem and joint swelling.  All other systems reviewed and are negative.    Physical Exam Updated Vital Signs BP (!)  145/96 (BP Location: Right Arm)   Pulse 94   Temp 98.4 F (36.9 C) (Oral)   Resp 18   LMP 03/19/2014   SpO2 96%   Physical Exam Constitutional:      Appearance: Catherine Patrick is well-developed.  HENT:     Head: Normocephalic.     Nose: Nose normal.  Eyes:     General: Lids are normal.  Neck:     Musculoskeletal: Normal range of motion.  Cardiovascular:     Rate and Rhythm: Normal rate.     Comments: 1+ DP and PT pulses bilaterally.  No proximal calf edema or tenderness Pulmonary:     Effort: Pulmonary effort is normal. No respiratory distress.  Musculoskeletal: Normal range of motion.        General: Swelling and tenderness present.     Comments: Left ankle: Moderate edema circumferentially around the ankle but worse laterally.  Focal tenderness to the area below the lateral malleolus, lateral midfoot.  Positive talar tilt.  Full range of motion of the ankle with pain with plantarflexion, inversion.  No bony tenderness over posterior aspect of lateral and medial malleoli, navicular bone or 5th metatarsal base.  Achilles tendon is non tender.  Negative anterior and posterior drawer. Negative syndesmosis squeeze test. Negative Thompson test.   Skin:    Comments: No left ankle erythema, warmth, fluctuance.  Skin is intact.  Neurological:     Mental Status: Catherine Patrick is alert.     Comments: Sensation and strength intact in the left ankle/great toe.  Psychiatric:        Behavior: Behavior normal.      ED Treatments / Results  Labs (all labs ordered are listed, but only abnormal results are displayed) Labs Reviewed - No data to display  EKG None  Radiology Dg Ankle Complete Left  Result Date: 07/14/2019 CLINICAL DATA:  Swelling after twisting ankle. EXAM: LEFT ANKLE COMPLETE - 3+ VIEW COMPARISON:  None. FINDINGS: Bilateral soft tissue swelling.  No fractures are seen. IMPRESSION: Soft tissue swelling.  No fracture identified. Electronically Signed   By: Dorise Bullion III M.D   On:  07/14/2019 16:49    Procedures Procedures (including critical care time)  Medications Ordered in ED Medications - No data to display   Initial Impression / Assessment and Plan / ED Course  I have reviewed the triage vital signs and the nursing notes.  Pertinent labs & imaging results that were available during my care of the patient  were reviewed by me and considered in my medical decision making (see chart for details).   Ddx includes soft tissue injury. History exam not c/w infectious process, gout, cellulitis, DVT.  No blood thinners and doubt hemarthrosis. X-rays obtained in triage negative for acute bony abnormality, dislocation.  There is soft tissue swelling.  Patient is wearing wedges.  I instructed her to wear sneakers to avoid more twisting.  Will DC with NSAIDs, elevation, ice, ASO brace, rest.  Follow-up with PCP in 1 week for persistent symptoms.  Return precautions given.  Final Clinical Impressions(s) / ED Diagnoses   Final diagnoses:  Left ankle swelling    ED Discharge Orders    None       Jerrell MylarGibbons, Amandeep Hogston J, PA-C 07/14/19 1914    Margarita Grizzleay, Danielle, MD 07/15/19 1345

## 2019-07-26 ENCOUNTER — Ambulatory Visit: Payer: Self-pay | Attending: Family Medicine

## 2019-07-26 ENCOUNTER — Other Ambulatory Visit: Payer: Self-pay

## 2019-07-26 ENCOUNTER — Telehealth: Payer: Self-pay | Admitting: Family Medicine

## 2019-07-26 NOTE — Telephone Encounter (Signed)
I call Pt to remind her that she has a 3pm TELE visit with financial , LVM

## 2019-08-08 ENCOUNTER — Inpatient Hospital Stay: Payer: Medicaid Other | Admitting: Family Medicine

## 2019-08-29 ENCOUNTER — Inpatient Hospital Stay: Payer: Medicaid Other | Admitting: Family Medicine

## 2019-09-11 ENCOUNTER — Other Ambulatory Visit: Payer: Self-pay

## 2019-09-11 DIAGNOSIS — Z20822 Contact with and (suspected) exposure to covid-19: Secondary | ICD-10-CM

## 2019-09-12 LAB — NOVEL CORONAVIRUS, NAA: SARS-CoV-2, NAA: NOT DETECTED

## 2019-09-13 ENCOUNTER — Telehealth: Payer: Self-pay | Admitting: Family Medicine

## 2019-10-08 ENCOUNTER — Other Ambulatory Visit: Payer: Self-pay

## 2019-10-08 DIAGNOSIS — Z20822 Contact with and (suspected) exposure to covid-19: Secondary | ICD-10-CM

## 2019-10-09 LAB — NOVEL CORONAVIRUS, NAA: SARS-CoV-2, NAA: NOT DETECTED

## 2020-01-10 ENCOUNTER — Other Ambulatory Visit: Payer: Self-pay

## 2020-01-10 ENCOUNTER — Ambulatory Visit: Payer: 59 | Attending: Family Medicine | Admitting: Family Medicine

## 2020-01-10 ENCOUNTER — Encounter: Payer: Self-pay | Admitting: Family Medicine

## 2020-01-10 VITALS — BP 137/89 | HR 78 | Ht <= 58 in | Wt 183.0 lb

## 2020-01-10 DIAGNOSIS — Z021 Encounter for pre-employment examination: Secondary | ICD-10-CM

## 2020-01-10 DIAGNOSIS — Z111 Encounter for screening for respiratory tuberculosis: Secondary | ICD-10-CM

## 2020-01-10 DIAGNOSIS — M79642 Pain in left hand: Secondary | ICD-10-CM | POA: Diagnosis not present

## 2020-01-10 NOTE — Progress Notes (Signed)
Established Patient Office Visit  Subjective:  Patient ID: Catherine Patrick, female    DOB: 01/12/1966  Age: 54 y.o. MRN: [id number]  CC: No chief complaint on file.   HPI Catherine Patrick, 54 year old female, who presents secondary to needing completion of paperwork/physical for employment as well as placement of PPD.  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  She denies any difficulty with performance of her current job duties.  She denies any issues with joint pain or decreased range of motion of the joints.  She does report that yesterday she had her left palm of her hand on the railing of the stairs where she lives and developed a small nodule along with red appearing bruising in the palm of her hand that looked like blood.  She reports that she massage the area and the redness resolved/turned light green and today there is no longer any redness or green color to the area.  She reports that the nodule is only tender if she touches it.  She does not have any difficulty opening or closing her hand or grasping objects other than discomfort when something touches the nodule.        She reports that her current only medication is over-the-counter vitamin D.  She denies any issues with anxiety or depression.  She has had no chest pain or palpitations, no shortness of breath or cough no headaches or dizziness.  She denies any abdominal pain-no nausea or vomiting.  Overall she feels that she is well.  She denies any history of TB and no history of abnormal PPDs in the past.  She has not had prior immunization for tuberculosis.  She denies any cough or shortness of breath, no unexplained weight loss and no night sweats.  Past Medical History:  Diagnosis Date  . DJD (degenerative joint disease)   . Headache   . History of blood transfusion    2 times after child birth  . History of kidney stones    passed one in 2014- 2017  . HTN (hypertension) 11/10/2016  .  Hypertension   . Kidney stones     Past Surgical History:  Procedure Laterality Date  . CESAREAN SECTION    . CESAREAN SECTION    . HERNIA REPAIR    . LIPOSUCTION  2002  . TOTAL KNEE ARTHROPLASTY Right 11/30/2016   Procedure: RIGHT TOTAL KNEE ARTHROPLASTY;  Surgeon: Tarry Kos, MD;  Location: [facility];  Service: Orthopedics;  Laterality: Right;  . TOTAL KNEE ARTHROPLASTY Left 03/16/2017   Procedure: LEFT TOTAL KNEE ARTHROPLASTY;  Surgeon: Tarry Kos, MD;  Location: [facility] OR;  Service: Orthopedics;  Laterality: Left;    Family History  Problem Relation Age of Onset  . Cancer Neg Hx   . Diabetes Neg Hx   . Heart failure Neg Hx   . Hyperlipidemia Neg Hx   . Hypertension Neg Hx     Social History   Socioeconomic History  . Marital status: Single    Spouse name: Not on file  . Number of children: Not on file  . Years of education: Not on file  . Highest education level: Not on file  Occupational History  . Not on file  Tobacco Use  . Smoking status: Never Smoker  . Smokeless tobacco: Never Used  Substance and Sexual Activity  . Alcohol use: No  . Drug use: No  . Sexual activity: Yes    Birth control/protection: None  Other Topics Concern  . Not on file  Social History Narrative  . Not on file   Social Determinants of Health   Financial Resource Strain:   . Difficulty of Paying Living Expenses: Not on file  Food Insecurity:   . Worried About Charity fundraiser in the Last Year: Not on file  . Ran Out of Food in the Last Year: Not on file  Transportation Needs:   . Lack of Transportation (Medical): Not on file  . Lack of Transportation (Non-Medical): Not on file  Physical Activity:   . Days of Exercise per Week: Not on file  . Minutes of Exercise per Session: Not on file  Stress:   . Feeling of Stress : Not on file  Social Connections:   . Frequency of Communication with Friends and Family: Not on file  . Frequency of Social Gatherings with Friends and Family:  Not on file  . Attends Religious Services: Not on file  . Active Member of Clubs or Organizations: Not on file  . Attends Archivist Meetings: Not on file  . Marital Status: Not on file  Intimate Partner Violence:   . Fear of Current or Ex-Partner: Not on file  . Emotionally Abused: Not on file  . Physically Abused: Not on file  . Sexually Abused: Not on file    Outpatient Medications Prior to Visit  Medication Sig Dispense Refill  . cholecalciferol (VITAMIN D) 1000 units tablet Take 1,000 Units by mouth daily.    . fluticasone (FLONASE) 50 MCG/ACT nasal spray Place 2 sprays into both nostrils daily. (Patient not taking: Reported on 01/09/2019) 16 g 6  . ibuprofen (ADVIL,MOTRIN) 200 MG tablet Take 600 mg by mouth every 6 (six) hours as needed for headache or moderate pain.    . meclizine (ANTIVERT) 12.5 MG tablet Take 1 tablet (12.5 mg total) by mouth 4 (four) times daily as needed for dizziness. (Patient not taking: Reported on 01/10/2020) 45 tablet 1  . predniSONE (DELTASONE) 20 MG tablet Take 2 pills today then 1 pill daily x 2 days then 1/2 pill daily x 4 days; take after eating (Patient not taking: Reported on 01/10/2020) 6 tablet 0   No facility-administered medications prior to visit.    Allergies  Allergen Reactions  . Amlodipine Palpitations    WITH FIRST DOSES BEGAN AFTER EACH DOSE RESOLVED WHEN D/C'd   . Pork-Derived Products     PATIENT PREFERENCE. CULTURAL DICTATES    ROS Review of Systems  Constitutional: Negative for chills and fever.  HENT: Negative for sore throat and trouble swallowing.   Eyes: Negative for photophobia and visual disturbance.  Respiratory: Negative for cough and shortness of breath.   Cardiovascular: Negative for chest pain, palpitations and leg swelling.  Gastrointestinal: Negative for abdominal pain, constipation, diarrhea and nausea.  Endocrine: Negative for polydipsia, polyphagia and polyuria.  Genitourinary: Negative for dysuria  and frequency.  Musculoskeletal: Positive for arthralgias (Pain in left hand since yesterday). Negative for back pain and gait problem.  Neurological: Negative for dizziness and headaches.      Objective:    Physical Exam  Constitutional: She is oriented to person, place, and time. She appears well-developed and well-nourished.  Obese female appearing younger than stated age seated on exam table; wearing face mask as per office COVID-19 precautions  Neck: No JVD present. No thyromegaly present.  Cardiovascular: Normal rate and regular rhythm.  Pulmonary/Chest: Effort normal and breath sounds normal.  Abdominal: Soft. There is  no abdominal tenderness. There is no rebound and no guarding.  Truncal obesity  Musculoskeletal:        General: Tenderness present.     Cervical back: Normal range of motion and neck supple.     Comments: Presence of a small nodule within lower surface of palm which per patient is tender with palpation. No difficulty with closing her hand into a fist; very mild palmar edema; light brown tint to area of nodule but otherwise no visible bruising  Lymphadenopathy:    She has no cervical adenopathy.  Neurological: She is alert and oriented to person, place, and time.  Skin: Skin is warm and dry.  Psychiatric: She has a normal mood and affect. Her behavior is normal.  Nursing note and vitals reviewed.   BP 137/89   Pulse 78   Ht 4\' 9"  (1.448 m)   Wt 183 lb (83 kg)   LMP 03/19/2014   SpO2 99%   BMI 39.60 kg/m  Wt Readings from Last 3 Encounters:  01/10/20 183 lb (83 kg)  01/28/19 182 lb (82.6 kg)  01/09/19 180 lb (81.6 kg)     Health Maintenance Due  Topic Date Due  . COLONOSCOPY  05/12/2014  . MAMMOGRAM  07/09/2017  . PAP SMEAR-Modifier  07/30/2018  . INFLUENZA VACCINE  07/06/2019      No results found for: TSH Lab Results  Component Value Date   WBC 9.1 01/28/2019   HGB 13.6 01/28/2019   HCT 41.5 01/28/2019   MCV 89 01/28/2019   PLT 222  01/28/2019   Lab Results  Component Value Date   NA 144 01/28/2019   K 4.4 01/28/2019   CO2 18 (L) 01/28/2019   GLUCOSE 88 01/28/2019   BUN 15 01/28/2019   CREATININE 0.70 01/28/2019   BILITOT 0.3 03/14/2017   ALKPHOS 154 (H) 03/14/2017   AST 25 03/14/2017   ALT 17 03/14/2017   PROT 7.1 03/14/2017   ALBUMIN 3.9 03/14/2017   CALCIUM 9.3 01/28/2019   ANIONGAP 7 09/19/2017   Lab Results  Component Value Date   CHOL 221 (H) 06/11/2015   Lab Results  Component Value Date   HDL 61 06/11/2015   Lab Results  Component Value Date   LDLCALC 143 (H) 06/11/2015   Lab Results  Component Value Date   TRIG 86 06/11/2015   Lab Results  Component Value Date   CHOLHDL 3.6 06/11/2015   Lab Results  Component Value Date   HGBA1C 5.4 06/11/2015      Assessment & Plan:  1. Encounter for PPD test Patient had placement of PPD at today's visit.  She denied prior positive PPD test or prior immunization for TB.  No current symptoms suggestive of TB.  She is to return to clinic Monday for nurse visit for reading of PPD.  Patient's PPD paperwork was given back to her at today's visit to bring to her visit on Monday. - PPD  2. Encounter for pre-employment examination Paperwork completed for patient's employer.  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  She reports only current medication is vitamin D.  She denies issues with anxiety or depression.  And she denies any muscle or joint pain or limitations other than current pain in the left hand.  Copy made of physical form that will be scanned into chart.  3. Left hand pain Patient with nodule in the palm of the left hand which should resolve without intervention but if  patient has continued pain at that site or develops difficulty with use of her hand, she is encouraged to return for further evaluation.  She was offered referral to hand specialist but declines at this time.  She may take over-the-counter  pain medication if needed.  An After Visit Summary was printed and given to the patient.  Follow-up: Return for return Monday for [place] for PPD reading; schedule annual well exam.   Cain Saupe, MD

## 2020-01-13 ENCOUNTER — Ambulatory Visit: Payer: 59 | Attending: Family Medicine

## 2020-01-13 ENCOUNTER — Other Ambulatory Visit: Payer: Self-pay

## 2020-01-13 DIAGNOSIS — Z111 Encounter for screening for respiratory tuberculosis: Secondary | ICD-10-CM

## 2020-01-13 LAB — TB SKIN TEST
Induration: 0 mm
TB Skin Test: NEGATIVE

## 2020-01-13 NOTE — Progress Notes (Signed)
Temperature Recorded today 98.63F .  Pt was seen today in the office for PPD interpretation.  PPD Reading Note PPD read and results entered in EpicCare. Result: 0 mm induration. Interpretation: Negative Allergic reaction: None

## 2020-03-31 ENCOUNTER — Emergency Department (HOSPITAL_COMMUNITY)
Admission: EM | Admit: 2020-03-31 | Discharge: 2020-03-31 | Disposition: A | Discharge status: Home / Self Care | Payer: 59 | Attending: Emergency Medicine | Admitting: Emergency Medicine

## 2020-03-31 ENCOUNTER — Emergency Department (HOSPITAL_COMMUNITY): Payer: 59

## 2020-03-31 ENCOUNTER — Other Ambulatory Visit: Payer: Self-pay

## 2020-03-31 DIAGNOSIS — I10 Essential (primary) hypertension: Secondary | ICD-10-CM | POA: Insufficient documentation

## 2020-03-31 DIAGNOSIS — Z79899 Other long term (current) drug therapy: Secondary | ICD-10-CM | POA: Diagnosis not present

## 2020-03-31 DIAGNOSIS — Z96653 Presence of artificial knee joint, bilateral: Secondary | ICD-10-CM | POA: Diagnosis not present

## 2020-03-31 DIAGNOSIS — U071 COVID-19: Secondary | ICD-10-CM | POA: Insufficient documentation

## 2020-03-31 DIAGNOSIS — R1013 Epigastric pain: Secondary | ICD-10-CM | POA: Diagnosis present

## 2020-03-31 LAB — CBC WITH DIFFERENTIAL/PLATELET
Abs Immature Granulocytes: 0.04 10*3/uL (ref 0.00–0.07)
Basophils Absolute: 0 10*3/uL (ref 0.0–0.1)
Basophils Relative: 0 %
Eosinophils Absolute: 0 10*3/uL (ref 0.0–0.5)
Eosinophils Relative: 1 %
HCT: 46.2 % — ABNORMAL HIGH (ref 36.0–46.0)
Hemoglobin: 14.7 g/dL (ref 12.0–15.0)
Immature Granulocytes: 1 %
Lymphocytes Relative: 31 %
Lymphs Abs: 1.4 10*3/uL (ref 0.7–4.0)
MCH: 29.6 pg (ref 26.0–34.0)
MCHC: 31.8 g/dL (ref 30.0–36.0)
MCV: 93 fL (ref 80.0–100.0)
Monocytes Absolute: 0.7 10*3/uL (ref 0.1–1.0)
Monocytes Relative: 15 %
Neutro Abs: 2.3 10*3/uL (ref 1.7–7.7)
Neutrophils Relative %: 52 %
Platelets: 166 10*3/uL (ref 150–400)
RBC: 4.97 MIL/uL (ref 3.87–5.11)
RDW: 14 % (ref 11.5–15.5)
WBC: 4.5 10*3/uL (ref 4.0–10.5)
nRBC: 0 % (ref 0.0–0.2)

## 2020-03-31 LAB — URINALYSIS, ROUTINE W REFLEX MICROSCOPIC
Bilirubin Urine: NEGATIVE
Glucose, UA: NEGATIVE mg/dL
Ketones, ur: NEGATIVE mg/dL
Leukocytes,Ua: NEGATIVE
Nitrite: NEGATIVE
Protein, ur: NEGATIVE mg/dL
Specific Gravity, Urine: 1.016 (ref 1.005–1.030)
pH: 5 (ref 5.0–8.0)

## 2020-03-31 LAB — POC SARS CORONAVIRUS 2 AG -  ED: SARS Coronavirus 2 Ag: POSITIVE — AB

## 2020-03-31 LAB — COMPREHENSIVE METABOLIC PANEL
ALT: 25 U/L (ref 0–44)
AST: 28 U/L (ref 15–41)
Albumin: 4.1 g/dL (ref 3.5–5.0)
Alkaline Phosphatase: 120 U/L (ref 38–126)
Anion gap: 10 (ref 5–15)
BUN: 18 mg/dL (ref 6–20)
CO2: 25 mmol/L (ref 22–32)
Calcium: 8.8 mg/dL — ABNORMAL LOW (ref 8.9–10.3)
Chloride: 103 mmol/L (ref 98–111)
Creatinine, Ser: 0.64 mg/dL (ref 0.44–1.00)
GFR calc Af Amer: >60 mL/min (ref >60)
GFR calc non Af Amer: >60 mL/min (ref >60)
Glucose, Bld: 98 mg/dL (ref 70–99)
Potassium: 3.7 mmol/L (ref 3.5–5.1)
Sodium: 138 mmol/L (ref 135–145)
Total Bilirubin: 0.5 mg/dL (ref 0.3–1.2)
Total Protein: 7.6 g/dL (ref 6.5–8.1)

## 2020-03-31 LAB — LIPASE, BLOOD: Lipase: 40 U/L (ref 11–51)

## 2020-03-31 MED ORDER — ALBUTEROL SULFATE HFA 108 (90 BASE) MCG/ACT IN AERS
6.0000 | INHALATION_SPRAY | Freq: Once | RESPIRATORY_TRACT | Status: AC
  Administered 2020-03-31: 6 via RESPIRATORY_TRACT
  Filled 2020-03-31: qty 6.7

## 2020-03-31 MED ORDER — ACETAMINOPHEN ER 650 MG PO TBCR: 650.0000 mg | EXTENDED_RELEASE_TABLET | Freq: Three times a day (TID) | ORAL | 0 refills | Status: DC | PRN

## 2020-03-31 NOTE — Discharge Instructions (Signed)
Our test confirms that you have COVID-19. Please read the instructions provided.  [facility] will look and see if you are eligible for additional treatment.  If you are they will give you a call.

## 2020-03-31 NOTE — ED Triage Notes (Signed)
Patient reports she had fever, headache,  and epigastric pain starting yesterday. Pain rated 8/10. Patient reports a cough when she lays down. Patient denies covid exposure but requesting covid test. Patient reports her temperature was 100.1 at home but did not take any medications due to she is fasting. Patient states she will not take any PO medications in ED today. Fasting ends at 2000 today.

## 2020-03-31 NOTE — ED Notes (Signed)
Patient results COVID positive. Patient relocated to post triage room 4 for infection control reasons.

## 2020-03-31 NOTE — ED Provider Notes (Signed)
[facility]-EMERGENCY DEPT Provider Note   CSN: [id number] Arrival date & time: 03/31/20  1113     History Chief Complaint  Patient presents with  . Headache  . Abdominal Pain    epigastric    Catherine Patrick is a 54 y.o. female.  HPI     54 year old female comes in a chief complaint of epigastric abdominal pain, headaches.  She also started having cough last night.  Patient reports that she was doing well until yesterday.  She denies any COVID-19 exposures.  Patient denies any nausea, vomiting, diarrhea.  Review of systems positive for chills and subjective fevers.  Past Medical History:  Diagnosis Date  . DJD (degenerative joint disease)   . Headache   . History of blood transfusion    2 times after child birth  . History of kidney stones    passed one in 2014- 2017  . HTN (hypertension) 11/10/2016  . Hypertension   . Kidney stones     Patient Active Problem List   Diagnosis Date Noted  . Dizziness 01/28/2019  . Allergic rhinitis 01/28/2019  . 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 Cervical radiculopathy 09/19/2016  . Left arm pain 09/19/2016  . Left lateral ankle pain 09/30/2015  . Plantar fasciitis of left foot 12/18/2013  . 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 Nephrolithiasis 05/30/2013    Past Surgical History:  Procedure Laterality Date  . CESAREAN SECTION    . CESAREAN SECTION    . HERNIA REPAIR    . LIPOSUCTION  2002  . TOTAL KNEE ARTHROPLASTY Right 11/30/2016   Procedure: RIGHT TOTAL KNEE ARTHROPLASTY;  Surgeon: Tarry Kos, MD;  Location: [facility] OR;  Service: Orthopedics;  Laterality: Right;  . TOTAL KNEE ARTHROPLASTY Left 03/16/2017   Procedure: LEFT TOTAL KNEE ARTHROPLASTY;  Surgeon: Tarry Kos, MD;  Location: [facility] OR;  Service: Orthopedics;  Laterality: Left;     OB History    Gravida      Para      Term      Preterm      AB      Living  3     SAB      TAB      Ectopic      Multiple      Live Births              Family History  Problem Relation Age of Onset  . Cancer Neg Hx   . Diabetes Neg Hx   . Heart failure Neg Hx   . Hyperlipidemia Neg Hx   . Hypertension Neg Hx     Social History   Tobacco Use  . Smoking status: Never Smoker  . Smokeless tobacco: Never Used  Substance Use Topics  . Alcohol use: No  . Drug use: No    Home Medications Prior to Admission medications   Medication Sig Start Date End Date Taking? Authorizing Provider  acetaminophen (TYLENOL 8 HOUR) 650 MG CR tablet Take 1 tablet (650 mg total) by mouth every 8 (eight) hours as needed. 03/31/20   Derwood Kaplan, MD  cholecalciferol (VITAMIN D) 1000 units tablet Take 1,000 Units by mouth daily.    [provider]  fluticasone (FLONASE) 50 MCG/ACT nasal spray Place 2 sprays into both nostrils daily. Patient not taking: Reported on 01/09/2019 12/27/17   Anders Simmonds, PA-C  ibuprofen (ADVIL,MOTRIN) 200 MG tablet Take 600 mg by mouth every 6 (  six) hours as needed for headache or moderate pain.    [provider]  meclizine (ANTIVERT) 12.5 MG tablet Take 1 tablet (12.5 mg total) by mouth 4 (four) times daily as needed for dizziness. Patient not taking: Reported on 01/10/2020 01/28/19   Antony Blackbird, MD  predniSONE (DELTASONE) 20 MG tablet Take 2 pills today then 1 pill daily x 2 days then 1/2 pill daily x 4 days; take after eating Patient not taking: Reported on 01/10/2020 01/28/19   Antony Blackbird, MD    Allergies    Amlodipine and Pork-derived products  Review of Systems   Review of Systems  Constitutional: Positive for activity change.  Respiratory: Positive for cough. Negative for shortness of breath.   Gastrointestinal: Positive for abdominal pain.  Neurological: Positive for headaches. Negative for dizziness, weakness and numbness.  All other systems reviewed and are negative.   Physical Exam Updated Vital Signs BP (!) 140/98 (BP  Location: Left Arm)   Pulse (!) 104   Temp 98.7 F (37.1 C) (Oral)   Resp 18   Ht 4\' 9"  (1.448 m)   Wt 81.2 kg   LMP 03/19/2014   SpO2 99%   BMI 38.74 kg/m   Physical Exam Vitals and nursing note reviewed.  Constitutional:      Appearance: She is well-developed.  HENT:     Head: Normocephalic and atraumatic.  Cardiovascular:     Rate and Rhythm: Normal rate.  Pulmonary:     Effort: Pulmonary effort is normal.  Abdominal:     General: Bowel sounds are normal.  Musculoskeletal:     Cervical back: Normal range of motion and neck supple.  Skin:    General: Skin is warm and dry.  Neurological:     Mental Status: She is alert and oriented to person, place, and time.     ED Results / Procedures / Treatments   Labs (all labs ordered are listed, but only abnormal results are displayed) Labs Reviewed  COMPREHENSIVE METABOLIC PANEL - Abnormal; Notable for the following components:      Result Value   Calcium 8.8 (*)    All other components within normal limits  CBC WITH DIFFERENTIAL/PLATELET - Abnormal; Notable for the following components:   HCT 46.2 (*)    All other components within normal limits  URINALYSIS, ROUTINE W REFLEX MICROSCOPIC - Abnormal; Notable for the following components:   Hgb urine dipstick SMALL (*)    Bacteria, UA RARE (*)    All other components within normal limits  POC SARS CORONAVIRUS 2 AG -  ED - Abnormal; Notable for the following components:   SARS Coronavirus 2 Ag POSITIVE (*)    All other components within normal limits  LIPASE, BLOOD    EKG EKG Interpretation  Date/Time:  Tuesday March 31 2020 11:44:32 EDT Ventricular Rate:  94 PR Interval:    QRS Duration: 76 QT Interval:  316 QTC Calculation: 396 R Axis:   14 Text Interpretation: Sinus rhythm Abnormal R-wave progression, early transition Borderline T abnormalities, inferior leads No acute changes No significant change since last tracing Confirmed by Varney Biles [phone]) on  03/31/2020 1:18:09 PM   Radiology No results found.  Procedures Procedures (including critical care time)  Medications Ordered in ED Medications  albuterol (VENTOLIN HFA) 108 (90 Base) MCG/ACT inhaler 6 puff (has no administration in time range)    ED Course  I have reviewed the triage vital signs and the nursing notes.  Pertinent labs & imaging results  that were available during my care of the patient were reviewed by me and considered in my medical decision making (see chart for details).    MDM Rules/Calculators/A&P                      Catherine Patrick was evaluated in Emergency Department on 03/31/2020 for the symptoms described in the history of present illness. She was evaluated in the context of the global COVID-19 pandemic, which necessitated consideration that the patient might be at risk for infection with the SARS-CoV-2 virus that causes COVID-19. Institutional protocols and algorithms that pertain to the evaluation of patients at risk for COVID-19 are in a state of rapid change based on information released by regulatory bodies including the CDC and federal and state organizations. These policies and algorithms were followed during the patient's care in the ED.   Patient comes in with headaches, chills, cough, epigastric abdominal pain.  Multiple systems, concerns were COVID-19 high and the test confirms the results.  Rest of the labs are reassuring.  Patient is fasting, advised that medically speaking we recommend that she stop the fasting and concentrate on her wellbeing.  Unfortunately they are not giving out monoclonal antibodies at a [facility].  Patient is eligible for MAB -no was informed that outpatient pharmacy can infuse her.  Final Clinical Impression(s) / ED Diagnoses Final diagnoses:  COVID-19    Rx / DC Orders ED Discharge Orders         Ordered    acetaminophen (TYLENOL 8 HOUR) 650 MG CR tablet  Every 8 hours PRN     03/31/20 1350           Derwood Kaplan, MD 03/31/20 1401

## 2020-04-01 ENCOUNTER — Telehealth: Payer: Self-pay | Admitting: Unknown Physician Specialty

## 2020-04-01 NOTE — Telephone Encounter (Signed)
Called to discuss with patient about Covid symptoms and the use of bamlanivimab, a monoclonal antibody infusion for those with mild to moderate Covid symptoms and at a high risk of hospitalization.  Pt is qualified for this infusion at the [facility] due to BMI>35   Message left to call back  

## 2020-04-01 NOTE — Telephone Encounter (Signed)
Called to discuss with Catherine Patrick about Covid symptoms and the use of bamlanivimab, a monoclonal antibody infusion for those with mild to moderate Covid symptoms and at a high risk of hospitalization.     Pt does not qualify for infusion therapy as she has asymptomatic infection as cough from yesterday resolved. Isolation precautions discussed. Advised to contact back for consideration should she develop symptoms. Patient verbalized understanding. She will call if starts developing symptoms.       Patient Active Problem List   Diagnosis Date Noted  . Dizziness 01/28/2019  . Allergic rhinitis 01/28/2019  . 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 Cervical radiculopathy 09/19/2016  . Left arm pain 09/19/2016  . Left lateral ankle pain 09/30/2015  . Plantar fasciitis of left foot 12/18/2013  . 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 Nephrolithiasis 05/30/2013   Symptom onset 4/27

## 2020-09-10 ENCOUNTER — Ambulatory Visit: Payer: Self-pay

## 2020-09-10 ENCOUNTER — Ambulatory Visit (INDEPENDENT_AMBULATORY_CARE_PROVIDER_SITE_OTHER): Payer: 59 | Admitting: Orthopaedic Surgery

## 2020-09-10 ENCOUNTER — Encounter: Payer: Self-pay | Admitting: Orthopaedic Surgery

## 2020-09-10 VITALS — Ht <= 58 in | Wt 171.0 lb

## 2020-09-10 DIAGNOSIS — M76822 Posterior tibial tendinitis, left leg: Secondary | ICD-10-CM

## 2020-09-10 DIAGNOSIS — M722 Plantar fascial fibromatosis: Secondary | ICD-10-CM

## 2020-09-10 MED ORDER — IBUPROFEN 800 MG PO TABS: 800.0000 mg | ORAL_TABLET | Freq: Three times a day (TID) | ORAL | 2 refills | Status: DC | PRN

## 2020-09-10 NOTE — Progress Notes (Signed)
Office Visit Note   Patient: Catherine Patrick           Date of Birth: 1966-04-18           MRN: [id number] Visit Date: 09/10/2020              Requested by: Cain Saupe, MD [address] [place],  [place] [address] PCP: Cain Saupe, MD   Assessment & Plan: Visit Diagnoses:  1. Posterior tibial tendon dysfunction, left   2. Plantar fasciitis of left foot     Plan: Impression is grade 2 posterior tibial tendon dysfunction and plantar fasciitis left foot. Due to the pain patient is having today, will place her in a cam boot weightbearing as tolerated. I will also called in a prescription for ibuprofen as well as Voltaren gel. We will provide her with Achilles stretching program as well. She will follow up with [place] in 4 to 6 weeks time for recheck. Call with concerns or questions.  Follow-Up Instructions: Return in about 5 weeks (around 10/15/2020).   Orders:  Orders Placed This Encounter  Procedures   XR Ankle Complete Left   No orders of the defined types were placed in this encounter.     Procedures: No procedures performed   Clinical Data: No additional findings.   Subjective: Chief Complaint  Patient presents with   Left Ankle - Pain    HPI a pleasant 54 year old female who comes in today with left foot and ankle pain. This began approximately 1 week ago. No known injury or change in activity. 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 The pain she has is to the anteromedial ankle and into the heel. She describes this as a stabbing pain worse when she gets up from sitting or lying down for long period of time. Her pain does improve after ambulating for a few minutes. She has not taken any medication for this. No numbness, tingling or burning.  Review of Systems as detailed in HPI. All others reviewed and are negative.   Objective: Vital Signs: Ht 4\' 9"  (1.448 m)    Wt 171 lb (77.6 kg)    LMP 03/19/2014    BMI 37.00 kg/m   Physical Exam  well-developed well-nourished female no acute distress. Alert and oriented x3.  Ortho Exam left foot and ankle exam shows mild to moderate swelling. She does have tenderness along the posterior tibial tendon attachment at the foot as well as marked tenderness to the plantar fascial insertion at the heel. She has a flexible pes planus. She does have a too many toes sign. Minimal pain with range of motion of the ankle. She is neurovascular intact distally.  Specialty Comments:  No specialty comments available.  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   Nephrolithiasis 05/30/2013   Past Medical History:  Diagnosis Date   DJD (degenerative joint disease)    Headache    History of blood transfusion    2 times after child birth   History of kidney stones    passed one  in 2014- 2017   HTN (hypertension) 11/10/2016   Hypertension    Kidney stones     Family History  Problem Relation Age of Onset   Cancer Neg Hx    Diabetes Neg Hx    Heart failure Neg Hx    Hyperlipidemia Neg Hx    Hypertension Neg Hx     Past Surgical History:  Procedure Laterality Date   CESAREAN SECTION     CESAREAN SECTION     HERNIA REPAIR     LIPOSUCTION  2002   TOTAL KNEE ARTHROPLASTY Right 11/30/2016   Procedure: RIGHT TOTAL KNEE ARTHROPLASTY;  Surgeon: Tarry Kos, MD;  [facility]: [facility];  Service: Orthopedics;  Laterality: Right;   TOTAL KNEE ARTHROPLASTY Left 03/16/2017   Procedure: LEFT TOTAL KNEE ARTHROPLASTY;  Surgeon: Tarry Kos, MD;   Location: [facility];  Service: Orthopedics;  Laterality: Left;   Social History   Occupational History   Not on file  Tobacco Use   Smoking status: Never Smoker   Smokeless tobacco: Never Used  Substance and Sexual Activity   Alcohol use: No   Drug use: No   Sexual activity: Yes    Birth control/protection: None

## 2020-11-17 ENCOUNTER — Encounter: Payer: Self-pay | Admitting: Orthopaedic Surgery

## 2020-11-17 ENCOUNTER — Ambulatory Visit (INDEPENDENT_AMBULATORY_CARE_PROVIDER_SITE_OTHER): Payer: 59 | Admitting: Orthopaedic Surgery

## 2020-11-17 ENCOUNTER — Ambulatory Visit (INDEPENDENT_AMBULATORY_CARE_PROVIDER_SITE_OTHER): Payer: 59

## 2020-11-17 ENCOUNTER — Other Ambulatory Visit: Payer: Self-pay

## 2020-11-17 ENCOUNTER — Telehealth: Payer: Self-pay

## 2020-11-17 DIAGNOSIS — M76822 Posterior tibial tendinitis, left leg: Secondary | ICD-10-CM | POA: Diagnosis not present

## 2020-11-17 DIAGNOSIS — Z96651 Presence of right artificial knee joint: Secondary | ICD-10-CM | POA: Diagnosis not present

## 2020-11-17 NOTE — Telephone Encounter (Signed)
Called patient and LVM to cancel her appointment with Cari for 12/16. The mobile medicine unit is back up & running and patient can go see Cari there. Thursday the mobile bus will be at [address] [facility]. [place] [address]. If patient does not want to be seen at the mobile she can be scheduled at [facility] with Ricky Stabs before the end of the year. Advised patient to call back [phone]. Also, patient paperwork she needs filled out is in Dr. Debroah Baller box up front at [facility],

## 2020-11-17 NOTE — Progress Notes (Addendum)
Office Visit Note   Patient: Catherine Patrick           Date of Birth: 1966-08-14           MRN: [id number] Visit Date: 11/17/2020              Requested by: No referring provider defined for this encounter. PCP: No primary care provider on file.   Assessment & Plan: Visit Diagnoses:  1. Posterior tibial tendon dysfunction, left   2. Status post total right knee replacement     Plan: Impression is left flexible pes planus.  She has never tried arch supports before so we will send her to the shoe market to get onw made.  I believe the right knee pain is due to compensation for the left foot pain.  We will see her back as needed.  Follow-Up Instructions: No follow-ups on file.   Orders:  Orders Placed This Encounter  Procedures  . XR Knee 1-2 Views Right   No orders of the defined types were placed in this encounter.     Procedures: No procedures performed   Clinical Data: No additional findings.   Subjective: Chief Complaint  Patient presents with  . Left Leg - Pain    Patient follows up today for continued bilateral pes planus.  She continues to have problems with walking and standing.  Advil does relieve the pain temporarily.  She mainly has pain when she is not wearing the boot but she is unable to wear the boot for prolonged period of time.   Review of Systems   Objective: Vital Signs: LMP 03/19/2014   Physical Exam  Ortho Exam Bilateral feet show pes planus deformities that are flexible. Specialty Comments:  No specialty comments available.  Imaging: No results found.   PMFS History: Patient Active Problem List   Diagnosis Date Noted  . Dizziness 01/28/2019  . Allergic rhinitis 01/28/2019  . 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 Cervical radiculopathy 09/19/2016  . Left arm pain 09/19/2016  . Left lateral ankle pain 09/30/2015  . Plantar fasciitis of left foot 12/18/2013  . 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 Nephrolithiasis 05/30/2013   Past Medical History:  Diagnosis Date  . DJD (degenerative joint disease)   . Headache   . History of blood transfusion    2 times after child birth  . History of kidney stones    passed one in 2014- 2017  . HTN (hypertension) 11/10/2016  . Hypertension   . Kidney stones     Family History  Problem Relation Age of Onset  . Cancer Neg Hx   . Diabetes Neg Hx   . Heart failure Neg Hx   . Hyperlipidemia Neg Hx   . Hypertension Neg Hx     Past Surgical History:  Procedure Laterality Date  . CESAREAN SECTION    . CESAREAN SECTION    . HERNIA REPAIR    . LIPOSUCTION  2002  . TOTAL KNEE ARTHROPLASTY Right 11/30/2016   Procedure: RIGHT TOTAL KNEE ARTHROPLASTY;  Surgeon: Tarry Kos, MD;  Location: [facility] OR;  Service: Orthopedics;  Laterality: Right;  . TOTAL KNEE ARTHROPLASTY Left 03/16/2017   Procedure: LEFT TOTAL KNEE ARTHROPLASTY;  Surgeon: Tarry Kos, MD;  Location: [facility] OR;  Service: Orthopedics;  Laterality: Left;   Social History   Occupational History  . Not on file  Tobacco Use  . Smoking status: Never Smoker  .  Smokeless tobacco: Never Used  Substance and Sexual Activity  . Alcohol use: No  . Drug use: No  . Sexual activity: Yes    Birth control/protection: None

## 2020-11-17 NOTE — Addendum Note (Signed)
Addended by: Mayra Reel on: 11/17/2020 01:28 PM   Modules accepted: Orders, Level of Service

## 2020-11-18 ENCOUNTER — Ambulatory Visit: Payer: 59 | Admitting: Family

## 2020-11-19 ENCOUNTER — Ambulatory Visit: Payer: 59 | Admitting: Physician Assistant

## 2020-11-20 ENCOUNTER — Ambulatory Visit: Payer: 59 | Admitting: Family

## 2020-11-23 ENCOUNTER — Encounter: Payer: Self-pay | Admitting: Family

## 2020-11-23 ENCOUNTER — Ambulatory Visit (INDEPENDENT_AMBULATORY_CARE_PROVIDER_SITE_OTHER): Payer: 59 | Admitting: Family

## 2020-11-23 VITALS — BP 111/82 | HR 83 | Wt 172.0 lb

## 2020-11-23 DIAGNOSIS — Z23 Encounter for immunization: Secondary | ICD-10-CM

## 2020-11-23 DIAGNOSIS — Z7689 Persons encountering health services in other specified circumstances: Secondary | ICD-10-CM

## 2020-11-23 NOTE — Progress Notes (Signed)
  Subjective:    Catherine Patrick - 54 y.o. female MRN [id number]  Date of birth: March 17, 1966  HPI  Catherine Patrick is to establish care. Patient has a PMH significant for allergic rhinitis, cervical radiculopathy, plantar fasciitis of left foot, metatarsal deformity, nephrolithiasis, total knee replacement status, bilateral pronation deformity of feet, left lateral ankle pain, left arm pain, and dizziness.   Current issues and/or concerns: None  ROS per HPI   Health Maintenance:  - Would like flu vaccine today. Health Maintenance Due  Topic Date Due  . COVID-19 Vaccine (1) Never done  . COLONOSCOPY  Never done  . MAMMOGRAM  07/09/2017  . PAP SMEAR-Modifier  07/30/2018    Past Medical History: Patient Active Problem List   Diagnosis Date Noted  . Dizziness 01/28/2019  . Allergic rhinitis 01/28/2019  . 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 Cervical radiculopathy 09/19/2016  . Left arm pain 09/19/2016  . Left lateral ankle pain 09/30/2015  . Plantar fasciitis of left foot 12/18/2013  . 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 Nephrolithiasis 05/30/2013    Social History   reports that she has never smoked. She has never used smokeless tobacco. She reports that she does not drink alcohol and does not use drugs.   Family History  family history is not on file.   Medications: reviewed and updated   Objective:   Physical Exam BP 111/82 (BP Location: Left Arm, Patient Position: Sitting)   Pulse 83   Wt 172 lb (78 kg)   LMP 03/19/2014   SpO2 97%   BMI 37.22 kg/m  Physical Exam Constitutional:      Appearance: Normal appearance.  Eyes:     Extraocular Movements: Extraocular movements intact.     Pupils: Pupils are equal, round, and reactive to light.  Cardiovascular:     Rate and Rhythm: Normal rate and regular rhythm.     Pulses: Normal pulses.     Heart sounds: Normal heart sounds.  Pulmonary:     Effort: Pulmonary  effort is normal.     Breath sounds: Normal breath sounds.  Neurological:     General: No focal deficit present.     Mental Status: She is alert and oriented to person, place, and time.  Psychiatric:        Mood and Affect: Mood normal.        Behavior: Behavior normal.        Thought Content: Thought content normal.     Assessment & Plan:  1. Encounter to establish care: - Patient presents today to establish care.  - Return in 4 to 6 weeks or sooner if needed for annual physical examination, labs, and health maintenance.   2. Need for immunization against influenza: - Flu vaccine administered today in clinic. - Patient also has Health Review Visit Guide paperwork. Requesting completion today as she is eligible for a monetary incentive bonus from her health insurance if she completes this before the end of this year.  - Paperwork completed today and flu vaccine administered. Counseled patient to return for remaining health maintenance issues not addressed today. Patient agreeable. - Flu Vaccine QUAD 36+ mos IM  Ricky Stabs, NP 11/23/2020, 10:26 AM Primary Care at [facility]

## 2020-11-23 NOTE — Patient Instructions (Addendum)
Return in 4 to 6 weeks or sooner if needed for physical examination, labs, and health maintenance.   Paperwork for health insurance bonus incentive signed today.   Mammogram A mammogram is an X-ray of the breasts that is done to check for changes that are not normal. This test can screen for and find any changes that may suggest breast cancer. Mammograms are regularly done on women. A man may have a mammogram if he has a lump or swelling in his breast. This test can also help to find other changes and variations in the breast. Tell a doctor:  About any allergies you have.  If you have breast implants.  If you have had previous breast disease, biopsy, or surgery.  If you are breastfeeding.  If you are younger than age 2.  If you have a family history of breast cancer.  Whether you are pregnant or may be pregnant. What are the risks? Generally, this is a safe procedure. However, problems may occur, including:  Exposure to radiation. Radiation levels are very low with this test.  The results being misinterpreted.  The need for further tests.  The inability of the mammogram to detect certain cancers. What happens before the procedure?  Have this test done about 1-2 weeks after your period. This is usually when your breasts are the least tender.  If you are visiting a new doctor or clinic, send any past mammogram images to your new doctor's office.  Wash your breasts and under your arms the day of the test.  Do not use deodorants, perfumes, lotions, or powders on the day of the test.  Take off any jewelry from your neck.  Wear clothes that you can change into and out of easily. What happens during the procedure?   You will undress from the waist up. You will put on a gown.  You will stand in front of the X-ray machine.  Each breast will be placed between two plastic or glass plates. The plates will press down on your breast for a few seconds. Try to stay as relaxed  as possible. This does not cause any harm to your breasts. Any discomfort you feel will be very brief.  X-rays will be taken from different angles of each breast. The procedure may vary among doctors and hospitals. What happens after the procedure?  The mammogram will be read by a specialist (radiologist).  You may need to do certain parts of the test again. This depends on the quality of the images.  Ask when your test results will be ready. Make sure you get your test results.  You may go back to your normal activities. Summary  A mammogram is a low energy X-ray of the breasts that is done to check for abnormal changes. A man may have this test if he has a lump or swelling in his breast.  Before the procedure, tell your doctor about any breast problems that you have had in the past.  Have this test done about 1-2 weeks after your period.  For the test, each breast will be placed between two plastic or glass plates. The plates will press down on your breast for a few seconds.  The mammogram will be read by a specialist (radiologist). Ask when your test results will be ready. Make sure you get your test results. This information is not intended to replace advice given to you by your health care provider. Make sure you discuss any questions you have with  your health care provider. Document Revised: 07/12/2018 Document Reviewed: 07/12/2018 Elsevier Patient Education  [place].

## 2020-12-08 ENCOUNTER — Encounter: Payer: 59 | Admitting: Family

## 2020-12-08 NOTE — Progress Notes (Signed)
Patient did not show for appointment.   

## 2020-12-25 ENCOUNTER — Telehealth: Payer: Self-pay

## 2020-12-25 NOTE — Telephone Encounter (Signed)
Pt contacted. Spoke w/pt which stated she does not want to continue having visits here at [facility]. She wants to have them at [facility] because its closer to her home she is old [place] pt. Adv pt that I will forward info to that ofc.

## 2021-01-19 ENCOUNTER — Other Ambulatory Visit: Payer: Self-pay

## 2021-01-19 ENCOUNTER — Ambulatory Visit: Payer: 59 | Attending: Internal Medicine | Admitting: Internal Medicine

## 2021-05-11 ENCOUNTER — Other Ambulatory Visit: Payer: Self-pay

## 2021-05-11 ENCOUNTER — Ambulatory Visit: Payer: 59 | Admitting: Physician Assistant

## 2021-05-11 VITALS — BP 139/89 | HR 81 | Temp 96.9°F | Resp 18 | Ht <= 58 in | Wt 169.0 lb

## 2021-05-11 DIAGNOSIS — J0100 Acute maxillary sinusitis, unspecified: Secondary | ICD-10-CM

## 2021-05-11 DIAGNOSIS — J029 Acute pharyngitis, unspecified: Secondary | ICD-10-CM

## 2021-05-11 LAB — POC COVID19 BINAXNOW: SARS Coronavirus 2 Ag: NEGATIVE

## 2021-05-11 MED ORDER — CETIRIZINE HCL 10 MG PO TABS
10.0000 mg | ORAL_TABLET | Freq: Every day | ORAL | 11 refills | Status: DC
  Filled 2021-05-11: qty 30, 30d supply, fill #0

## 2021-05-11 MED ORDER — FLUTICASONE PROPIONATE 50 MCG/ACT NA SUSP
2.0000 | Freq: Every day | NASAL | 6 refills | Status: DC
  Filled 2021-05-11: qty 16, 30d supply, fill #0

## 2021-05-11 NOTE — Progress Notes (Signed)
Patient reports left eye burning today with left side ear pain and sore throat for the past 2 days. Patient denies any exposure. Patient has eaten today and patient has not taken medication today.

## 2021-05-11 NOTE — Progress Notes (Signed)
Established Patient Office Visit  Subjective:  Patient ID: Catherine Patrick, female    DOB: 1966-07-21  Age: 55 y.o. MRN: [id number]  CC:  Chief Complaint  Patient presents with  . Sore Throat  . Burning Eyes    HPI [name] reports that she has been experiencing a sore throat and left ear pain for the past 2 days.  Reports that today she started experiencing a burning sensation in her left eye.  Reports that she used ibuprofen yesterday with relief, but does state her symptoms returned a few hours later.  Reports that she is eating drinking and sleeping okay.  Denies any sick contacts.  Past Medical History:  Diagnosis Date  . DJD (degenerative joint disease)   . Headache   . History of blood transfusion    2 times after child birth  . History of kidney stones    passed one in 2014- 2017  . HTN (hypertension) 11/10/2016  . Hypertension   . Kidney stones     Past Surgical History:  Procedure Laterality Date  . CESAREAN SECTION    . CESAREAN SECTION    . HERNIA REPAIR    . LIPOSUCTION  2002  . TOTAL KNEE ARTHROPLASTY Right 11/30/2016   Procedure: RIGHT TOTAL KNEE ARTHROPLASTY;  Surgeon: Tarry Kos, MD;  Location: [facility] OR;  Service: Orthopedics;  Laterality: Right;  . TOTAL KNEE ARTHROPLASTY Left 03/16/2017   Procedure: LEFT TOTAL KNEE ARTHROPLASTY;  Surgeon: Tarry Kos, MD;  Location: [facility] OR;  Service: Orthopedics;  Laterality: Left;    Family History  Problem Relation Age of Onset  . Cancer Neg Hx   . Diabetes Neg Hx   . Heart failure Neg Hx   . Hyperlipidemia Neg Hx   . Hypertension Neg Hx     Social History   Socioeconomic History  . Marital status: Single    Spouse name: Not on file  . Number of children: Not on file  . Years of education: Not on file  . Highest education level: Not on file  Occupational History  . Not on file  Tobacco Use  . Smoking status: Never Smoker  . Smokeless tobacco: Never Used  Substance and Sexual Activity  . Alcohol use:  No  . Drug use: No  . Sexual activity: Yes    Birth control/protection: None  Other Topics Concern  . Not on file  Social History Narrative  . Not on file   Social Determinants of Health   Financial Resource Strain: Not on file  Food Insecurity: Not on file  Transportation Needs: Not on file  Physical Activity: Not on file  Stress: Not on file  Social Connections: Not on file  Intimate Partner Violence: Not on file    Outpatient Medications Prior to Visit  Medication Sig Dispense Refill  . acetaminophen (TYLENOL 8 HOUR) 650 MG CR tablet Take 1 tablet (650 mg total) by mouth every 8 (eight) hours as needed. 30 tablet 0  . cholecalciferol (VITAMIN D) 1000 units tablet Take 1,000 Units by mouth daily.    Marland Kitchen ibuprofen (ADVIL) 800 MG tablet Take 1 tablet (800 mg total) by mouth every 8 (eight) hours as needed. 30 tablet 2  . meclizine (ANTIVERT) 12.5 MG tablet Take 1 tablet (12.5 mg total) by mouth 4 (four) times daily as needed for dizziness. (Patient not taking: No sig reported) 45 tablet 1  . fluticasone (FLONASE) 50 MCG/ACT nasal spray Place 2 sprays into both nostrils daily. (Patient  not taking: No sig reported) 16 g 6  . predniSONE (DELTASONE) 20 MG tablet Take 2 pills today then 1 pill daily x 2 days then 1/2 pill daily x 4 days; take after eating (Patient not taking: Reported on 11/23/2020) 6 tablet 0   No facility-administered medications prior to visit.    Allergies  Allergen Reactions  . Amlodipine Palpitations    WITH FIRST DOSES BEGAN AFTER EACH DOSE RESOLVED WHEN D/C'd   . Pork-Derived Products     PATIENT PREFERENCE. CULTURAL DICTATES    ROS Review of Systems  Constitutional: Negative for chills and fever.  HENT: Positive for ear pain, sinus pressure and sore throat. Negative for congestion, ear discharge and trouble swallowing.   Eyes: Negative for discharge, itching and visual disturbance.  Respiratory: Negative for cough and shortness of breath.    Cardiovascular: Negative for chest pain.  Gastrointestinal: Negative for nausea and vomiting.  Endocrine: Negative.   Genitourinary: Negative.   Musculoskeletal: Negative for myalgias.  Skin: Negative.   Allergic/Immunologic: Negative.   Neurological: Negative for headaches.  Hematological: Negative.       Objective:    Physical Exam Vitals and nursing note reviewed.  Constitutional:      Appearance: Normal appearance. She is well-developed.  HENT:     Head: Normocephalic and atraumatic.     Right Ear: Tympanic membrane and ear canal normal. Tympanic membrane is not erythematous.     Left Ear: Tympanic membrane and ear canal normal. Tympanic membrane is not erythematous.     Nose: No congestion.     Left Turbinates: Swollen.     Left Sinus: Maxillary sinus tenderness present.     Mouth/Throat:     Mouth: Mucous membranes are moist.     Pharynx: No pharyngeal swelling or oropharyngeal exudate.     Tonsils: No tonsillar exudate. 0 on the right. 0 on the left.  Eyes:     Conjunctiva/sclera: Conjunctivae normal.     Pupils: Pupils are equal, round, and reactive to light.  Cardiovascular:     Rate and Rhythm: Normal rate and regular rhythm.     Pulses: Normal pulses.     Heart sounds: Normal heart sounds.  Pulmonary:     Effort: Pulmonary effort is normal.     Breath sounds: Normal breath sounds. No wheezing.  Musculoskeletal:        General: Normal range of motion.     Cervical back: Normal range of motion and neck supple.  Lymphadenopathy:     Cervical: No cervical adenopathy.  Skin:    General: Skin is warm and dry.  Neurological:     General: No focal deficit present.     Mental Status: She is alert and oriented to person, place, and time.  Psychiatric:        Mood and Affect: Mood normal.        Behavior: Behavior normal.        Thought Content: Thought content normal.        Judgment: Judgment normal.     BP 139/89 (BP Location: Left Arm, Patient Position:  Sitting, Cuff Size: Large)   Pulse 81   Temp (!) 96.9 F (36.1 C) (Oral)   Resp 18   Ht 4\' 9"  (1.448 m)   Wt 169 lb (76.7 kg)   LMP 03/19/2014   SpO2 99%   BMI 36.57 kg/m  Wt Readings from Last 3 Encounters:  05/11/21 169 lb (76.7 kg)  11/23/20 172 lb (78 kg)  09/10/20 171 lb (77.6 kg)     Health Maintenance Due  Topic Date Due  . COVID-19 Vaccine (1) Never done  . COLONOSCOPY (Pts 45-94yrs Insurance coverage will need to be confirmed)  Never done  . Zoster Vaccines- Shingrix (1 of 2) Never done  . MAMMOGRAM  07/09/2017  . PAP SMEAR-Modifier  07/30/2018    There are no preventive care reminders to display for this patient.  No results found for: TSH Lab Results  Component Value Date   WBC 4.5 03/31/2020   HGB 14.7 03/31/2020   HCT 46.2 (H) 03/31/2020   MCV 93.0 03/31/2020   PLT 166 03/31/2020   Lab Results  Component Value Date   NA 138 03/31/2020   K 3.7 03/31/2020   CO2 25 03/31/2020   GLUCOSE 98 03/31/2020   BUN 18 03/31/2020   CREATININE 0.64 03/31/2020   BILITOT 0.5 03/31/2020   ALKPHOS 120 03/31/2020   AST 28 03/31/2020   ALT 25 03/31/2020   PROT 7.6 03/31/2020   ALBUMIN 4.1 03/31/2020   CALCIUM 8.8 (L) 03/31/2020   ANIONGAP 10 03/31/2020   Lab Results  Component Value Date   CHOL 221 (H) 06/11/2015   Lab Results  Component Value Date   HDL 61 06/11/2015   Lab Results  Component Value Date   LDLCALC 143 (H) 06/11/2015   Lab Results  Component Value Date   TRIG 86 06/11/2015   Lab Results  Component Value Date   CHOLHDL 3.6 06/11/2015   Lab Results  Component Value Date   HGBA1C 5.4 06/11/2015      Assessment & Plan:   Problem List Items Addressed This Visit   None   Visit Diagnoses    Acute maxillary sinusitis, recurrence not specified    -  Primary   Relevant Medications   fluticasone (FLONASE) 50 MCG/ACT nasal spray   cetirizine (ZYRTEC) 10 MG tablet   Sore throat       Relevant Orders   POC COVID-19 (Completed)      1. Acute maxillary sinusitis, recurrence not specified Trial Flonase, Zyrtec.  Patient encouraged to continue supportive care, increase fluids, get plenty of rest.  Red flags given for prompt reevaluation. - fluticasone (FLONASE) 50 MCG/ACT nasal spray; Place 2 sprays into both nostrils daily.  Dispense: 16 g; Refill: 6 - cetirizine (ZYRTEC) 10 MG tablet; Take 1 tablet (10 mg total) by mouth daily.  Dispense: 30 tablet; Refill: 11  2. Sore throat Rapid COVID test negative.  - POC COVID-19   I have reviewed the patient's medical history (PMH, PSH, Social History, Family History, Medications, and allergies) , and have been updated if relevant. I spent 24 minutes reviewing chart and  face to face time with patient.     Meds ordered this encounter  Medications  . fluticasone (FLONASE) 50 MCG/ACT nasal spray    Sig: Place 2 sprays into both nostrils daily.    Dispense:  16 g    Refill:  6    Order Specific Question:   Supervising Provider    Answer:   Shan Levans E [[id number]]  . cetirizine (ZYRTEC) 10 MG tablet    Sig: Take 1 tablet (10 mg total) by mouth daily.    Dispense:  30 tablet    Refill:  11    Order Specific Question:   Supervising Provider    Answer:   Storm Frisk [[id number]]    Follow-up: Return if symptoms worsen or fail to improve.  Loraine Grip Mayers, PA-C

## 2021-05-11 NOTE — Patient Instructions (Signed)
I encourage you to take Zyrtec and use the Flonase on a daily basis until your symptoms resolve.  Make sure that you drink plenty of fluids and get lots of rest and have a very happy birthday.  Please feel free to return to the mobile unit if your symptoms have not resolved by the middle of next week.  Roney Jaffe, PA-C Physician Assistant [facility] [email or web address]    Sinusitis, Adult Sinusitis is inflammation of your sinuses. Sinuses are hollow spaces in the bones around your face. Your sinuses are located:  Around your eyes.  In the middle of your forehead.  Behind your nose.  In your cheekbones. Mucus normally drains out of your sinuses. When your nasal tissues become inflamed or swollen, mucus can become trapped or blocked. This allows bacteria, viruses, and fungi to grow, which leads to infection. Most infections of the sinuses are caused by a virus. Sinusitis can develop quickly. It can last for up to 4 weeks (acute) or for more than 12 weeks (chronic). Sinusitis often develops after a cold. What are the causes? This condition is caused by anything that creates swelling in the sinuses or stops mucus from draining. This includes:  Allergies.  Asthma.  Infection from bacteria or viruses.  Deformities or blockages in your nose or sinuses.  Abnormal growths in the nose (nasal polyps).  Pollutants, such as chemicals or irritants in the air.  Infection from fungi (rare). What increases the risk? You are more likely to develop this condition if you:  Have a weak body defense system (immune system).  Do a lot of swimming or diving.  Overuse nasal sprays.  Smoke. What are the signs or symptoms? The main symptoms of this condition are pain and a feeling of pressure around the affected sinuses. Other symptoms include:  Stuffy nose or congestion.  Thick drainage from your nose.  Swelling and warmth  over the affected sinuses.  Headache.  Upper toothache.  Ear pain  A cough that may get worse at night.  Extra mucus that collects in the throat or the back of the nose (postnasal drip).  Decreased sense of smell and taste.  Fatigue.  A fever.  Sore throat.  Bad breath. How is this diagnosed? This condition is diagnosed based on:  Your symptoms.  Your medical history.  A physical exam.  Tests to find out if your condition is acute or chronic. This may include: ? Checking your nose for nasal polyps. ? Viewing your sinuses using a device that has a light (endoscope). ? Testing for allergies or bacteria. ? Imaging tests, such as an MRI or CT scan. In rare cases, a bone biopsy may be done to rule out more serious types of fungal sinus disease. How is this treated? Treatment for sinusitis depends on the cause and whether your condition is chronic or acute.  If caused by a virus, your symptoms should go away on their own within 10 days. You may be given medicines to relieve symptoms. They include: ? Medicines that shrink swollen nasal passages (topical intranasal decongestants). ? Medicines that treat allergies (antihistamines). ? A spray that eases inflammation of the nostrils (topical intranasal corticosteroids). ? Rinses that help get rid of thick mucus in your nose (nasal saline washes).  If caused by bacteria, your health care provider may recommend waiting to see if your symptoms improve. Most bacterial infections will get better without antibiotic medicine. You may be given antibiotics if you have: ? A  severe infection. ? A weak immune system.  If caused by narrow nasal passages or nasal polyps, you may need to have surgery. Follow these instructions at home: Medicines  Take, use, or apply over-the-counter and prescription medicines only as told by your health care provider. These may include nasal sprays.  If you were prescribed an antibiotic medicine, take it  as told by your health care provider. Do not stop taking the antibiotic even if you start to feel better. Hydrate and humidify  Drink enough fluid to keep your urine pale yellow. Staying hydrated will help to thin your mucus.  Use a cool mist humidifier to keep the humidity level in your home above 50%.  Inhale steam for 10-15 minutes, 3-4 times a day, or as told by your health care provider. You can do this in the bathroom while a hot shower is running.  Limit your exposure to cool or dry air.   Rest  Rest as much as possible.  Sleep with your head raised (elevated).  Make sure you get enough sleep each night. General instructions  Apply a warm, moist washcloth to your face 3-4 times a day or as told by your health care provider. This will help with discomfort.  Wash your hands often with soap and water to reduce your exposure to germs. If soap and water are not available, use hand sanitizer.  Do not smoke. Avoid being around people who are smoking (secondhand smoke).  Keep all follow-up visits as told by your health care provider. This is important.   Contact a health care provider if:  You have a fever.  Your symptoms get worse.  Your symptoms do not improve within 10 days. Get help right away if:  You have a severe headache.  You have persistent vomiting.  You have severe pain or swelling around your face or eyes.  You have vision problems.  You develop confusion.  Your neck is stiff.  You have trouble breathing. Summary  Sinusitis is soreness and inflammation of your sinuses. Sinuses are hollow spaces in the bones around your face.  This condition is caused by nasal tissues that become inflamed or swollen. The swelling traps or blocks the flow of mucus. This allows bacteria, viruses, and fungi to grow, which leads to infection.  Keep all follow-up visits as told by your health care provider. This is important. This information is not intended to replace  advice given to you by your health care provider. Make sure you discuss any questions you have with your health care provider. Document Revised: 04/23/2018 Document Reviewed: 04/23/2018 Elsevier Patient Education  2021 [employer].

## 2021-05-14 ENCOUNTER — Other Ambulatory Visit: Payer: Self-pay

## 2021-08-31 ENCOUNTER — Telehealth: Payer: Self-pay | Admitting: Orthopaedic Surgery

## 2021-08-31 NOTE — Telephone Encounter (Signed)
Yes permanent

## 2021-08-31 NOTE — Telephone Encounter (Signed)
Patient request a renewal for a handicap placecard

## 2021-09-01 NOTE — Telephone Encounter (Signed)
Ready for pick up at the front desk. Patient is aware.

## 2022-01-11 ENCOUNTER — Other Ambulatory Visit: Payer: Self-pay

## 2022-01-11 ENCOUNTER — Observation Stay (HOSPITAL_COMMUNITY)
Admission: EM | Admit: 2022-01-11 | Discharge: 2022-01-14 | Disposition: A | Discharge status: Home / Self Care | Payer: 59 | Attending: Internal Medicine | Admitting: Internal Medicine

## 2022-01-11 ENCOUNTER — Encounter (HOSPITAL_COMMUNITY): Payer: Self-pay

## 2022-01-11 DIAGNOSIS — Z20822 Contact with and (suspected) exposure to covid-19: Secondary | ICD-10-CM | POA: Insufficient documentation

## 2022-01-11 DIAGNOSIS — Z96653 Presence of artificial knee joint, bilateral: Secondary | ICD-10-CM | POA: Diagnosis not present

## 2022-01-11 DIAGNOSIS — I10 Essential (primary) hypertension: Secondary | ICD-10-CM | POA: Diagnosis not present

## 2022-01-11 DIAGNOSIS — R200 Anesthesia of skin: Secondary | ICD-10-CM | POA: Diagnosis present

## 2022-01-11 DIAGNOSIS — G459 Transient cerebral ischemic attack, unspecified: Principal | ICD-10-CM | POA: Insufficient documentation

## 2022-01-11 DIAGNOSIS — E669 Obesity, unspecified: Secondary | ICD-10-CM | POA: Diagnosis present

## 2022-01-11 DIAGNOSIS — R531 Weakness: Secondary | ICD-10-CM

## 2022-01-11 DIAGNOSIS — Z79899 Other long term (current) drug therapy: Secondary | ICD-10-CM | POA: Insufficient documentation

## 2022-01-11 DIAGNOSIS — R350 Frequency of micturition: Secondary | ICD-10-CM | POA: Diagnosis present

## 2022-01-11 NOTE — ED Triage Notes (Signed)
Pt states that she started having stroke like symptoms around 6 pm. Pt states that her right and left arm went numb and she had dizziness. Pt tried to call for help and could not. Pt states that her current symptoms are just a headache.

## 2022-01-11 NOTE — ED Provider Triage Note (Signed)
Emergency Medicine Provider Triage Evaluation Note  Catherine Patrick , a 56 y.o. female  was evaluated in triage.  Pt complains of sudden onset of right arm weakness that started at [facility]. She notes her head fell backwards and she was unable to speak. She then notes her left arm became weak. Symptoms completely resolved; however she admits to a headache. No chest pain or SOB. Denies previous CVA or TIA.   Review of Systems  Positive: weakness Negative: fever  Physical Exam  BP (!) 161/113 (BP Location: Right Arm)    Pulse 78    Temp 97.7 F (36.5 C) (Oral)    Resp 18    Ht 4\' 9"  (1.448 m)    Wt 77.1 kg    LMP 03/19/2014    SpO2 98%    BMI 36.79 kg/m  Gen:   Awake, no distress   Resp:  Normal effort  MSK:   Moves extremities without difficulty  Other:  Equal grip strength, no facial droop, no pronator droop, patient able ambulate without difficulty  Medical Decision Making  Medically screening exam initiated at 11:21 PM.  Appropriate orders placed.  Cedric Fishman was informed that the remainder of the evaluation will be completed by another provider, this initial triage assessment does not replace that evaluation, and the importance of remaining in the ED until their evaluation is complete.  No neurological deficits on exam. Out of TNK/TPA window. Stroke labs ordered. CT head.    Suzy Bouchard, [place] 01/11/22 2323

## 2022-01-11 NOTE — ED Provider Notes (Signed)
[facility]-EMERGENCY DEPT Provider Note   CSN: [id number] Arrival date & time: 01/11/22  2303     History  Chief Complaint  Patient presents with   Dizziness   numbness in arms    Catherine Patrick is a 56 y.o. female.  Patient is a 56 year old female with past medical history of total knee replacement, allergies.  Patient presenting today with complaints of dizziness, weakness, and difficulty speaking.  Patient states that after work this evening she got into her car to drive home.  She felt as though her right arm was heavy and was having difficulty starting the car.  This sensation then spread to the left arm and patient reports headache and difficulty speaking.  She had a coworker drive her home.  The coworker gave her chewing gum which seem to help relieve her symptoms.  This episode lasted for approximately 15 minutes.  After arriving home, patient decided to come to the ER to be evaluated.  She has improving numbness, but continues with some headache.  She denies any visual disturbances.  The history is provided by the patient.      Home Medications Prior to Admission medications   Medication Sig Start Date End Date Taking? Authorizing Provider  acetaminophen (TYLENOL 8 HOUR) 650 MG CR tablet Take 1 tablet (650 mg total) by mouth every 8 (eight) hours as needed. 03/31/20   Derwood Kaplan, MD  cetirizine (ZYRTEC) 10 MG tablet Take 1 tablet (10 mg total) by mouth daily. 05/11/21   Mayers, Cari S, PA-C  cholecalciferol (VITAMIN D) 1000 units tablet Take 1,000 Units by mouth daily.    [provider]  fluticasone (FLONASE) 50 MCG/ACT nasal spray Place 2 sprays into both nostrils daily. 05/11/21   Mayers, Cari S, PA-C  ibuprofen (ADVIL) 800 MG tablet Take 1 tablet (800 mg total) by mouth every 8 (eight) hours as needed. 09/10/20   Cristie Hem, PA-C  meclizine (ANTIVERT) 12.5 MG tablet Take 1 tablet (12.5 mg total) by mouth 4 (four) times daily as needed for  dizziness. Patient not taking: No sig reported 01/28/19   Fulp, Cammie, MD      Allergies    Amlodipine and Pork-derived products    Review of Systems   Review of Systems  All other systems reviewed and are negative.  Physical Exam Updated Vital Signs BP (!) 161/113 (BP Location: Right Arm)    Pulse 78    Temp 97.7 F (36.5 C) (Oral)    Resp 18    Ht 4\' 9"  (1.448 m)    Wt 77.1 kg    LMP 03/19/2014    SpO2 98%    BMI 36.79 kg/m  Physical Exam Vitals and nursing note reviewed.  Constitutional:      General: She is not in acute distress.    Appearance: She is well-developed. She is not diaphoretic.  HENT:     Head: Normocephalic and atraumatic.  Eyes:     Extraocular Movements: Extraocular movements intact.     Pupils: Pupils are equal, round, and reactive to light.  Cardiovascular:     Rate and Rhythm: Normal rate and regular rhythm.     Heart sounds: No murmur heard.   No friction rub. No gallop.  Pulmonary:     Effort: Pulmonary effort is normal. No respiratory distress.     Breath sounds: Normal breath sounds. No wheezing.  Abdominal:     General: Bowel sounds are normal. There is no distension.  Palpations: Abdomen is soft.     Tenderness: There is no abdominal tenderness.  Musculoskeletal:        General: Normal range of motion.     Cervical back: Normal range of motion and neck supple.  Skin:    General: Skin is warm and dry.  Neurological:     General: No focal deficit present.     Mental Status: She is alert and oriented to person, place, and time.     Cranial Nerves: No cranial nerve deficit.     Sensory: No sensory deficit.     Motor: No weakness.     Coordination: Coordination normal.     Gait: Gait normal.    ED Results / Procedures / Treatments   Labs (all labs ordered are listed, but only abnormal results are displayed) Labs Reviewed  RESP PANEL BY RT-PCR (FLU A&B, COVID) ARPGX2  ETHANOL  PROTIME-INR  APTT  CBC  DIFFERENTIAL  COMPREHENSIVE  METABOLIC PANEL  RAPID URINE DRUG SCREEN, HOSP PERFORMED  URINALYSIS, ROUTINE W REFLEX MICROSCOPIC  I-STAT CHEM 8, ED  I-STAT BETA HCG BLOOD, ED ([facility], [facility], [facility] ONLY)    EKG EKG Interpretation  Date/Time:  Tuesday January 11 2022 23:54:27 EST Ventricular Rate:  72 PR Interval:    QRS Duration: 81 QT Interval:  389 QTC Calculation: 426 R Axis:   29 Text Interpretation: Sinus rhythm with premature atrial contractions Abnormal R-wave progression, early transition Confirmed by Geoffery Lyons ([id number]) on 01/11/2022 11:58:19 PM  Radiology No results found.  Procedures Procedures    Medications Ordered in ED Medications - No data to display  ED Course/ Medical Decision Making/ A&P  This patient presents to the ED for concern of headache and right arm numbness, this involves an extensive number of treatment options, and is a complaint that carries with it a high risk of complications and morbidity.  The differential diagnosis includes TIA, CVA, migraine headache   Co morbidities that complicate the patient evaluation  None   Additional history obtained:  No additional history or outside records necessary   Lab Tests:  I Ordered, and personally interpreted labs.  The pertinent results include: CBC, basic metabolic panel, both of which are unremarkable   Imaging Studies ordered:  I ordered imaging studies including CT scan of the head I independently visualized and interpreted imaging which showed no acute intra cranial process I agree with the radiologist interpretation   Cardiac Monitoring:  The patient was maintained on a cardiac monitor.  I personally viewed and interpreted the cardiac monitored which showed an underlying rhythm of: Sinus rhythm with PACs   Medicines ordered and prescription drug management:  No medications ordered or indicated I have reviewed the patients home medicines and have made adjustments as needed   Test Considered:  Patient will  likely require MRI of the brain, echocardiogram, and Doppler studies of the carotids as an inpatient   Critical Interventions:  None   Consultations Obtained:  I requested consultation with the hospitalist,  and discussed lab and imaging findings as well as pertinent plan - they recommend: Admit for observation and TIA work-up   Problem List / ED Course:  Patient presenting today with complaints of headache and numbness in her right hand that started after finishing her shift at work.  This episode lasted for approximately 20 minutes.  Patient arrives here neurologically intact with no focal deficits.  Work-up initiated including laboratory studies and head CT.  These were all essentially unremarkable.  As  this appears to be a TIA, I feel that the patient will require admission for further work-up.  I spoke with Dr. Julian Reil who agrees to admit.   Reevaluation:  After the interventions noted above, I reevaluated the patient and found that they have : Resolved   Social Determinants of Health:  None   Dispostion:  After consideration of the diagnostic results and the patients response to treatment, I feel that the patent would benefit from admission for TIA work-up.    Final Clinical Impression(s) / ED Diagnoses Final diagnoses:  None    Rx / DC Orders ED Discharge Orders     None         Geoffery Lyons, MD 01/12/22 [phone]

## 2022-01-12 ENCOUNTER — Observation Stay (HOSPITAL_COMMUNITY): Payer: 59

## 2022-01-12 ENCOUNTER — Observation Stay (HOSPITAL_BASED_OUTPATIENT_CLINIC_OR_DEPARTMENT_OTHER): Payer: 59

## 2022-01-12 ENCOUNTER — Emergency Department (HOSPITAL_COMMUNITY): Payer: 59

## 2022-01-12 ENCOUNTER — Encounter (HOSPITAL_COMMUNITY): Payer: Self-pay | Admitting: Internal Medicine

## 2022-01-12 DIAGNOSIS — G459 Transient cerebral ischemic attack, unspecified: Secondary | ICD-10-CM | POA: Diagnosis present

## 2022-01-12 DIAGNOSIS — I10 Essential (primary) hypertension: Secondary | ICD-10-CM | POA: Diagnosis not present

## 2022-01-12 DIAGNOSIS — Z79899 Other long term (current) drug therapy: Secondary | ICD-10-CM | POA: Diagnosis not present

## 2022-01-12 DIAGNOSIS — Z20822 Contact with and (suspected) exposure to covid-19: Secondary | ICD-10-CM | POA: Diagnosis not present

## 2022-01-12 DIAGNOSIS — R350 Frequency of micturition: Secondary | ICD-10-CM | POA: Diagnosis present

## 2022-01-12 DIAGNOSIS — R200 Anesthesia of skin: Secondary | ICD-10-CM | POA: Diagnosis present

## 2022-01-12 DIAGNOSIS — Z96653 Presence of artificial knee joint, bilateral: Secondary | ICD-10-CM | POA: Diagnosis not present

## 2022-01-12 DIAGNOSIS — E669 Obesity, unspecified: Secondary | ICD-10-CM | POA: Diagnosis present

## 2022-01-12 LAB — COMPREHENSIVE METABOLIC PANEL
ALT: 19 U/L (ref 0–44)
AST: 25 U/L (ref 15–41)
Albumin: 3.8 g/dL (ref 3.5–5.0)
Alkaline Phosphatase: 104 U/L (ref 38–126)
Anion gap: 7 (ref 5–15)
BUN: 17 mg/dL (ref 6–20)
CO2: 26 mmol/L (ref 22–32)
Calcium: 9.4 mg/dL (ref 8.9–10.3)
Chloride: 106 mmol/L (ref 98–111)
Creatinine, Ser: 0.47 mg/dL (ref 0.44–1.00)
GFR, Estimated: >60 mL/min (ref >60)
Glucose, Bld: 83 mg/dL (ref 70–99)
Potassium: 3.9 mmol/L (ref 3.5–5.1)
Sodium: 139 mmol/L (ref 135–145)
Total Bilirubin: 0.5 mg/dL (ref 0.3–1.2)
Total Protein: 7.5 g/dL (ref 6.5–8.1)

## 2022-01-12 LAB — CBC
HCT: 44.2 % (ref 36.0–46.0)
Hemoglobin: 14.2 g/dL (ref 12.0–15.0)
MCH: 29.5 pg (ref 26.0–34.0)
MCHC: 32.1 g/dL (ref 30.0–36.0)
MCV: 91.7 fL (ref 80.0–100.0)
Platelets: 233 10*3/uL (ref 150–400)
RBC: 4.82 MIL/uL (ref 3.87–5.11)
RDW: 13.4 % (ref 11.5–15.5)
WBC: 8.8 10*3/uL (ref 4.0–10.5)
nRBC: 0 % (ref 0.0–0.2)

## 2022-01-12 LAB — URINALYSIS, ROUTINE W REFLEX MICROSCOPIC
Bacteria, UA: NONE SEEN
Bilirubin Urine: NEGATIVE
Glucose, UA: NEGATIVE mg/dL
Hgb urine dipstick: NEGATIVE
Ketones, ur: NEGATIVE mg/dL
Nitrite: NEGATIVE
Protein, ur: NEGATIVE mg/dL
Specific Gravity, Urine: 1.006 (ref 1.005–1.030)
pH: 8 (ref 5.0–8.0)

## 2022-01-12 LAB — RAPID URINE DRUG SCREEN, HOSP PERFORMED
Amphetamines: NOT DETECTED
Barbiturates: NOT DETECTED
Benzodiazepines: NOT DETECTED
Cocaine: NOT DETECTED
Opiates: NOT DETECTED
Tetrahydrocannabinol: NOT DETECTED

## 2022-01-12 LAB — DIFFERENTIAL
Abs Immature Granulocytes: 0.09 10*3/uL — ABNORMAL HIGH (ref 0.00–0.07)
Basophils Absolute: 0.1 10*3/uL (ref 0.0–0.1)
Basophils Relative: 1 %
Eosinophils Absolute: 0.1 10*3/uL (ref 0.0–0.5)
Eosinophils Relative: 2 %
Immature Granulocytes: 1 %
Lymphocytes Relative: 44 %
Lymphs Abs: 3.9 10*3/uL (ref 0.7–4.0)
Monocytes Absolute: 0.6 10*3/uL (ref 0.1–1.0)
Monocytes Relative: 7 %
Neutro Abs: 4 10*3/uL (ref 1.7–7.7)
Neutrophils Relative %: 45 %

## 2022-01-12 LAB — RESP PANEL BY RT-PCR (FLU A&B, COVID) ARPGX2
Influenza A by PCR: NEGATIVE
Influenza B by PCR: NEGATIVE
SARS Coronavirus 2 by RT PCR: NEGATIVE

## 2022-01-12 LAB — HEMOGLOBIN A1C
Hgb A1c MFr Bld: 5.1 % (ref 4.8–5.6)
Mean Plasma Glucose: 99.67 mg/dL

## 2022-01-12 LAB — ETHANOL: Alcohol, Ethyl (B): <10 mg/dL (ref <10)

## 2022-01-12 LAB — APTT: aPTT: 26 seconds (ref 24–36)

## 2022-01-12 LAB — LIPID PANEL
Cholesterol: 197 mg/dL (ref 0–200)
HDL: 60 mg/dL (ref >40)
LDL Cholesterol: 124 mg/dL — ABNORMAL HIGH (ref 0–99)
Total CHOL/HDL Ratio: 3.3 RATIO
Triglycerides: 63 mg/dL (ref <150)
VLDL: 13 mg/dL (ref 0–40)

## 2022-01-12 LAB — PROTIME-INR
INR: 1 (ref 0.8–1.2)
Prothrombin Time: 13.5 seconds (ref 11.4–15.2)

## 2022-01-12 MED ORDER — ACETAMINOPHEN 325 MG PO TABS
650.0000 mg | ORAL_TABLET | Freq: Four times a day (QID) | ORAL | Status: DC | PRN
  Administered 2022-01-12: 650 mg via ORAL
  Filled 2022-01-12: qty 2

## 2022-01-12 MED ORDER — ONDANSETRON HCL 4 MG/2ML IJ SOLN
4.0000 mg | Freq: Once | INTRAMUSCULAR | Status: AC
  Administered 2022-01-12: 4 mg via INTRAVENOUS
  Filled 2022-01-12: qty 2

## 2022-01-12 MED ORDER — PHENAZOPYRIDINE HCL 200 MG PO TABS
200.0000 mg | ORAL_TABLET | Freq: Three times a day (TID) | ORAL | Status: AC
  Administered 2022-01-12 – 2022-01-14 (×5): 200 mg via ORAL
  Filled 2022-01-12 (×5): qty 1

## 2022-01-12 MED ORDER — STROKE: EARLY STAGES OF RECOVERY BOOK
Freq: Once | Status: AC
  Filled 2022-01-12: qty 1

## 2022-01-12 MED ORDER — ACETAMINOPHEN 650 MG RE SUPP: 650.0000 mg | Freq: Four times a day (QID) | RECTAL | Status: DC | PRN

## 2022-01-12 MED ORDER — ONDANSETRON HCL 4 MG PO TABS: 4.0000 mg | ORAL_TABLET | Freq: Four times a day (QID) | ORAL | Status: DC | PRN

## 2022-01-12 MED ORDER — ONDANSETRON HCL 4 MG/2ML IJ SOLN: 4.0000 mg | Freq: Four times a day (QID) | INTRAMUSCULAR | Status: DC | PRN

## 2022-01-12 MED ORDER — MORPHINE SULFATE (PF) 4 MG/ML IV SOLN
4.0000 mg | Freq: Once | INTRAVENOUS | Status: AC
  Administered 2022-01-12: 4 mg via INTRAVENOUS
  Filled 2022-01-12: qty 1

## 2022-01-12 MED ORDER — ACETAMINOPHEN 500 MG PO TABS
1000.0000 mg | ORAL_TABLET | Freq: Once | ORAL | Status: AC
  Administered 2022-01-12: 1000 mg via ORAL
  Filled 2022-01-12: qty 2

## 2022-01-12 NOTE — ED Notes (Signed)
[facility] for transport in 15 minutes

## 2022-01-12 NOTE — Evaluation (Signed)
Occupational Therapy Evaluation Patient Details Name: Catherine Patrick MRN: [id number] DOB: June 25, 1966 Today's Date: 01/12/2022   History of Present Illness Catherine Patrick is a 56 y.o. female with medical history significant of DJD, headaches, history of blood transfusion, urolithiasis, hypertension who is coming to the emergency department with complaints of right-sided weakness and particularly the right hand numbness as associated with brief motor aphasia, mild nausea, photophobia and frontal headache.   Clinical Impression   Catherine Patrick is a 56 year old woman admitted to hospital with complaints of right arm weakness and aphasia. 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 On evaluation patient reports all symptoms have resolved except for a headache and some mild dizziness with standing. Patient does not exhibit any focal neurological deficits with normal ROM, strength and coordination of upper extremities. Patient able to perform bed mobility, marching in place and pseudo ADL task. She reports ambulating to the bathroom without difficulty 3 times in the ED. She reports no visual changes or word finding difficulties. She reports feeling as if "I'm in water" with standing but no overt loss of balance or nystagmus noted in her eyes. She does report a history of vertigo. From an OT standpoint patient has no needs.     Recommendations for follow up therapy are one component of a multi-disciplinary discharge planning process, led by the attending physician.  Recommendations may be updated based on patient status, additional functional criteria and insurance authorization.   Follow Up Recommendations  No OT follow up    Assistance Recommended at Discharge None  Patient can return home with the following      Functional Status Assessment  Patient has not had a recent decline in their functional status  Equipment Recommendations  None recommended by OT     Recommendations for Other Services       Precautions / Restrictions Precautions Precautions: None      Mobility Bed Mobility Overal bed mobility: Independent                  Transfers Overall transfer level: Independent                 General transfer comment: Reports ambulating to the bathroom 3 times without assistance. Able to stand and march in place.      Balance Overall balance assessment: No apparent balance deficits (not formally assessed) (able to resist anterior nudge)                                         ADL either performed or assessed with clinical judgement   ADL Overall ADL's : At baseline                                             Vision Patient Visual Report: No change from baseline       Perception     Praxis      Pertinent Vitals/Pain Pain Assessment Pain Assessment: Faces Faces Pain Scale: Hurts little more Pain Location: headache Pain Descriptors / Indicators: Grimacing Pain Intervention(s): Limited activity within patient's tolerance     Hand Dominance Right   Extremity/Trunk Assessment Upper Extremity Assessment Upper Extremity Assessment: RUE deficits/detail;LUE deficits/detail RUE Deficits / Details: WNL ROM, 5/5  strength except grip 4/5 RUE Sensation: WNL RUE Coordination: WNL LUE Deficits / Details: WNL ROM, 5/5 strength excep grip 4/5 LUE Sensation: WNL LUE Coordination: WNL   Lower Extremity Assessment Lower Extremity Assessment: Overall WFL for tasks assessed (history of r TKA)   Cervical / Trunk Assessment Cervical / Trunk Assessment: Normal   Communication Communication Communication: No difficulties   Cognition Arousal/Alertness: Awake/alert Behavior During Therapy: WFL for tasks assessed/performed Overall Cognitive Status: Within Functional Limits for tasks assessed                                       General Comments        Exercises     Shoulder Instructions      Home Living Family/patient expects to be discharged to:: Private residence Living Arrangements: Children;Other relatives Available Help at Discharge: Family;Available 24 hours/day Type of Home: House Home Access: Stairs to enter [employer] of Steps: 3 Entrance Stairs-Rails: Left;Right Home Layout: One level     Bathroom Shower/Tub: Theme park manager: No              Prior Functioning/Environment Prior Level of Function : Independent/Modified Independent             Mobility Comments: independent ADLs Comments: independent - works as a Runner, 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        OT Problem List:        OT Treatment/Interventions:      OT Goals(Current goals can be found in the care plan section) Acute Rehab OT Goals OT Goal Formulation: All assessment and education complete, DC therapy  OT Frequency:      Co-evaluation              AM-PAC OT "6 Clicks" Daily Activity     Outcome Measure Help from another person eating meals?: None Help from another person taking care of personal grooming?: None Help from another person toileting, which includes using toliet, bedpan, or urinal?: None Help from another person bathing (including washing, rinsing, drying)?: None Help from another person to put on and taking off regular upper body clothing?: None Help from another person to put on and taking off regular lower body clothing?: None 6 Click Score: 24   End of Session Nurse Communication:  (okay to see)  Activity Tolerance: Patient tolerated treatment well Patient left: in bed;with call bell/phone within reach  OT Visit Diagnosis: Hemiplegia and hemiparesis Hemiplegia - Right/Left: Right Hemiplegia - dominant/non-dominant: Dominant Hemiplegia - caused by: Unspecified                Time: 9937-1696 OT Time Calculation (min): 14 min Charges:  OT General  Charges $OT Visit: 1 Visit OT Evaluation $OT Eval Low Complexity: 1 Low  Catherine Patrick, OTR/L Acute Care Rehab Services  Office [phone] Pager: [phone]   Catherine Patrick 01/12/2022, 3:25 PM

## 2022-01-12 NOTE — ED Notes (Signed)
Pt in MRI.

## 2022-01-12 NOTE — ED Notes (Signed)
Vascular en route.

## 2022-01-12 NOTE — Progress Notes (Signed)
Carotid artery duplex completed. Refer to "CV Proc" under chart review to view preliminary results.  01/12/2022 10:24 AM Eula Fried., MHA, RVT, RDCS, RDMS

## 2022-01-12 NOTE — Progress Notes (Incomplete)
Echocardiogram not completed, patient is undergoing other testing at this time.   Leta Jungling RDCS

## 2022-01-12 NOTE — ED Notes (Signed)
Pt NAD in bed, a/ox4. Pt speaking clearly, full and complete sentences. Pt states at approximately 1800 she was sitting into the driver seat of her car and tried to put the keys in the ignition but her right arm was heavy and she could not move it, followed by dizziness and a syncopal episode. Pt states she then felt similar symptoms of heaviness in left arm. Pt states her she ate gum and then began to feel better. Pt states only a headache now remains.

## 2022-01-12 NOTE — H&P (Signed)
History and Physical    PatientReo Patrick [id number] DOB: 1966-09-17 DOA: 01/11/2022 DOS: the patient was seen and examined on 01/12/2022 PCP: Ladell Pier, MD  Patient coming from: Home  Chief Complaint:  Chief Complaint  Patient presents with   Dizziness   numbness in arms   HPI: Catherine Patrick is a 56 y.o. female with medical history significant of DJD, headaches, history of blood transfusion, urolithiasis, hypertension who is coming to the emergency department with complaints of right-sided weakness and particularly the right hand numbness as associated with brief motor aphasia, mild nausea, photophobia and frontal headache.  No fever, chills, night sweats, vision changes, cervical stiffness, dyspnea, productive cough, wheezing or hemoptysis.  No chest pain, palpitations, diaphoresis, PND, orthopnea or recent pitting edema of the lower extremities.  Denied abdominal pain, emesis, diarrhea, constipation, melena or hematochezia.  She has high frequency, but no flank pain, dysuria or hematuria.  No polyuria, polydipsia, polyphagia or blurry vision.  ED: Initial vital signs were temperature 97.7 F, pulse 78, respiration 18, BP 161/113 mmHg and O2 sat 98% on room air.  The patient received acetaminophen 1000 mg p.o. x1.  I ordered morphine 4 mg and ondansetron 4 mg IVP after the patient and stated her frontal headache was very intense.  Labwork: Urinalysis with moderate leukocyte esterase, but otherwise unremarkable.  UDS was negative.  CBC, CMP, alcohol level, PT, INR and PTT were normal.  Imaging: CT head without contrast did not show any acute intracranial abnormalities.  Review of Systems: As mentioned in the history of present illness. All other systems reviewed and are negative. 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 She has never used smokeless tobacco. She reports that she does not drink alcohol and does not use drugs.  Allergies  Allergen Reactions   Amlodipine Palpitations and Other (See Comments)    WITH FIRST DOSES BEGAN AFTER EACH DOSE- RESOLVED WHEN D/C'd    Pork-Derived Products Other (See Comments)    PATIENT PREFERENCE- CULTURAL DICTATES    Family History  Problem Relation Age of Onset   Diabetes Father    Kidney disease Father    Diabetes Brother    Cancer Neg Hx    Heart failure Neg Hx    Hyperlipidemia Neg Hx    Hypertension Neg Hx     Prior to Admission medications   Medication Sig Start Date End Date Taking? Authorizing Provider  Cholecalciferol (VITAMIN D-3) 25 MCG (1000 UT) CAPS Take 1,000 Units by mouth daily.   Yes [provider]  acetaminophen (TYLENOL 8 HOUR) 650 MG CR tablet  Take 1 tablet (650 mg total) by mouth every 8 (eight) hours as needed. Patient not taking: Reported on 01/12/2022 03/31/20   Varney Biles, MD  cetirizine (ZYRTEC) 10 MG tablet Take 1 tablet (10 mg total) by mouth daily. Patient not taking: Reported on 01/12/2022 05/11/21   Mayers, Cari S, PA-C  fluticasone (FLONASE) 50 MCG/ACT nasal spray Place 2 sprays into both nostrils daily. Patient not taking: Reported on 01/12/2022 05/11/21   Mayers, Cari S, PA-C  ibuprofen (ADVIL) 800 MG tablet Take 1  tablet (800 mg total) by mouth every 8 (eight) hours as needed. Patient not taking: Reported on 01/12/2022 09/10/20   Aundra Dubin, PA-C  meclizine (ANTIVERT) 12.5 MG tablet Take 1 tablet (12.5 mg total) by mouth 4 (four) times daily as needed for dizziness. Patient not taking: Reported on 01/12/2022 01/28/19   Antony Blackbird, MD    Physical Exam: Vitals:   01/12/22 0600 01/12/22 0630 01/12/22 0716 01/12/22 0730  BP: 131/82 102/62 (!) 134/94 134/90  Pulse: 79 (!) 58 73 74  Resp: 14 13 15 13   Temp:      TempSrc:      SpO2: 97% 99% 100% 100%  Weight:      Height:       Physical Exam Constitutional:      Appearance: Normal appearance.  HENT:     Head: Normocephalic and atraumatic.     Nose: Nose normal.  Eyes:     General: No scleral icterus.       Right eye: No discharge.        Left eye: No discharge.     Conjunctiva/sclera: Conjunctivae normal.     Pupils: Pupils are equal, round, and reactive to light.  Cardiovascular:     Rate and Rhythm: Normal rate and regular rhythm.  Pulmonary:     Effort: Pulmonary effort is normal.     Breath sounds: Normal breath sounds.  Abdominal:     General: Bowel sounds are normal. There is no distension.     Palpations: Abdomen is soft.     Tenderness: There is no abdominal tenderness.  Musculoskeletal:        General: Normal range of motion.     Cervical back: Neck supple.  Skin:    General: Skin is warm and dry.  Neurological:     General: No focal deficit present.     Mental Status: She is alert and oriented to person, place, and time.     Cranial Nerves: No cranial nerve deficit.     Sensory: No sensory deficit.     Motor: No weakness.    Data Reviewed:  There are no new results to review at this time.  Assessment and Plan: Principal Problem:   TIA (transient ischemic attack) Observation/telemetry. Frequent neurochecks. Swallow screen. Check fasting lipid/hemoglobin A1c. Check MRI of the brain. Check echocardiogram. Check  carotid Doppler. Consult PT at [facility] as needed. Consult SLP as needed.  Active Problems:   Hypertension Currently not on antihypertensives. Allowing permissive hypertension this time. Continue blood pressure monitoring. May need antihypertensive upon discharge.    Class 2 obesity Lifestyle modifications. Follow-up with primary care provider.    Frequency of urination Urine analysis showed positive leukocyte esterase, but otherwise normal. I will start Pyridium 200 mg p.o. 3 times daily x2 days. Continue to monitor clinically and repeat urinalysis if necessary.    Advance Care Planning:   Code Status: Full Code   Consults:   Family Communication:  Severity of Illness: The appropriate patient status for this patient is OBSERVATION. Observation status is judged to be reasonable and necessary in order to provide the required intensity of service to ensure the patient's safety. The patient's presenting symptoms, physical exam findings, and initial radiographic and laboratory data in the context of their medical condition is felt to place them at decreased risk for further clinical deterioration. Furthermore, it is anticipated that the patient will be medically stable for discharge from the hospital within 2 midnights of admission.   Author: Reubin Milan, MD 01/12/2022 8:27 AM  For on call review [email or web address].

## 2022-01-13 ENCOUNTER — Observation Stay (HOSPITAL_BASED_OUTPATIENT_CLINIC_OR_DEPARTMENT_OTHER): Payer: 59

## 2022-01-13 ENCOUNTER — Observation Stay (HOSPITAL_COMMUNITY): Payer: 59

## 2022-01-13 DIAGNOSIS — R55 Syncope and collapse: Secondary | ICD-10-CM

## 2022-01-13 DIAGNOSIS — R531 Weakness: Secondary | ICD-10-CM

## 2022-01-13 DIAGNOSIS — I10 Essential (primary) hypertension: Secondary | ICD-10-CM | POA: Diagnosis not present

## 2022-01-13 DIAGNOSIS — G459 Transient cerebral ischemic attack, unspecified: Secondary | ICD-10-CM

## 2022-01-13 LAB — ECHOCARDIOGRAM COMPLETE
AR max vel: 1.96 cm2
AV Area VTI: 1.88 cm2
AV Area mean vel: 1.85 cm2
AV Mean grad: 5 mmHg
AV Peak grad: 9.5 mmHg
Ao pk vel: 1.54 m/s
Area-P 1/2: 3.42 cm2
Height: 57 in
S' Lateral: 2.4 cm
Weight: 2720 oz

## 2022-01-13 MED ORDER — IOHEXOL 350 MG/ML SOLN
75.0000 mL | Freq: Once | INTRAVENOUS | Status: AC | PRN
  Administered 2022-01-13: 75 mL via INTRAVENOUS

## 2022-01-13 MED ORDER — ATORVASTATIN CALCIUM 40 MG PO TABS
40.0000 mg | ORAL_TABLET | Freq: Every day | ORAL | Status: DC
  Administered 2022-01-13 – 2022-01-14 (×2): 40 mg via ORAL
  Filled 2022-01-13 (×2): qty 1

## 2022-01-13 MED ORDER — ASPIRIN 325 MG PO TABS
325.0000 mg | ORAL_TABLET | Freq: Every day | ORAL | Status: DC
  Administered 2022-01-13: 325 mg via ORAL
  Filled 2022-01-13: qty 1

## 2022-01-13 NOTE — Progress Notes (Signed)
[facility]                                                                               Catherine Patrick, is a 56 y.o. female, DOB - [birth year], [id number] Admit date - 01/11/2022    Outpatient Primary MD for the patient is Ladell Pier, MD  LOS - 0  days    Brief summary   Catherine Patrick is a 56 y.o. female with medical history significant of DJD, headaches, history of blood transfusion, urolithiasis, hypertension who is coming to the emergency department with complaints of right-sided weakness and particularly the right hand numbness as associated with brief motor aphasia, mild nausea, photophobia and frontal headache.  Assessment & Plan    Assessment and Plan: * TIA (transient ischemic attack)- (present on admission) Differential include psychogenic weakness.  So far work up is negative.  Neurology consulted and recommendations given.  Continue with aspirin and lipitor.  LDL elevated at 124.  Hemoglobin A1c is 5.1%   Hypertension- (present on admission) Pt not on meds.  Monitor overnight.       Estimated body mass index is 36.79 kg/m as calculated from the following:   Height as of this encounter: 4\' 9"  (1.448 m).   Weight as of this encounter: 77.1 kg.  Code Status: full code.  DVT Prophylaxis:  SCDs Start: 01/12/22 0807   Level of Care: Level of care: Telemetry Medical Family Communication:none at bedside.   Disposition Plan:     Remains inpatient appropriate:  Monitor overnight  Procedures:  echo  Consultants:   Neurology.   Antimicrobials:   Anti-infectives (From admission, onward)   None        Medications  Scheduled Meds:  aspirin  325 mg Oral Daily   atorvastatin  40 mg Oral Daily   phenazopyridine  200 mg Oral TID WC   Continuous Infusions: PRN Meds:.acetaminophen **OR** acetaminophen, ondansetron **OR** ondansetron (ZOFRAN) IV    Subjective:   Catherine Patrick was seen and examined today.  No symptoms.    Objective:   Vitals:   01/13/22 0349 01/13/22 0730 01/13/22 1204 01/13/22 1602  BP: 115/77 (!) 130/98 (!) 138/95 (!) 139/98  Pulse: 89 91 98 89  Resp: 18 14 16 14   Temp: 98.1 F (36.7 C) 97.6 F (36.4 C) 97.8 F (36.6 C) 98.1 F (36.7 C)  TempSrc: Oral Oral Oral Oral  SpO2: 96% 100% 99% 99%  Weight:      Height:        Intake/Output Summary (Last 24 hours) at 01/13/2022 1803 Last data filed at 01/13/2022 0900 Gross per 24 hour  Intake 240 ml  Output --  Net 240 ml   Filed Weights   01/11/22 2316  Weight: 77.1 kg     Exam  General: Alert and oriented x 3, NAD  Cardiovascular: S1 S2 auscultated, no murmurs, RRR  Respiratory: Clear to auscultation bilaterally, no wheezing, rales or rhonchi  Gastrointestinal: Soft, nontender, nondistended, + bowel sounds  Ext: no pedal edema bilaterally  Neuro: AAOx3, Cr N's II- XII. Strength 5/5 upper and lower extremities bilaterally  Skin: No rashes  Psych: Normal affect and demeanor, alert and oriented  x3    Data Reviewed:  I have personally reviewed following labs and imaging studies   CBC Lab Results  Component Value Date   WBC 8.8 01/12/2022   RBC 4.82 01/12/2022   HGB 14.2 01/12/2022   HCT 44.2 01/12/2022   MCV 91.7 01/12/2022   MCH 29.5 01/12/2022   PLT 233 01/12/2022   MCHC 32.1 01/12/2022   RDW 13.4 01/12/2022   LYMPHSABS 3.9 01/12/2022   MONOABS 0.6 01/12/2022   EOSABS 0.1 01/12/2022   BASOSABS 0.1 [id number]     Last metabolic panel Lab Results  Component Value Date   NA 139 01/12/2022   K 3.9 01/12/2022   CL 106 01/12/2022   CO2 26 01/12/2022   BUN 17 01/12/2022   CREATININE 0.47 01/12/2022   GLUCOSE 83 01/12/2022   GFRNONAA >60 01/12/2022   GFRAA >60 03/31/2020   CALCIUM 9.4 01/12/2022   PROT 7.5 01/12/2022   ALBUMIN 3.8 01/12/2022   BILITOT 0.5 01/12/2022   ALKPHOS 104 01/12/2022   AST 25 01/12/2022   ALT 19 01/12/2022   ANIONGAP 7 01/12/2022    CBG (last 3)  No results for  input(s): GLUCAP in the last 72 hours.    Coagulation Profile: Recent Labs  Lab 01/12/22 0002  INR 1.0     Radiology Studies: CT HEAD WO CONTRAST  Result Date: 01/12/2022 CLINICAL DATA:  Neuro deficit, acute, stroke suspected. Bilateral arm numbness. Headache. EXAM: CT HEAD WITHOUT CONTRAST TECHNIQUE: Contiguous axial images were obtained from the base of the skull through the vertex without intravenous contrast. RADIATION DOSE REDUCTION: This exam was performed according to the departmental dose-optimization program which includes automated exposure control, adjustment of the mA and/or kV according to patient size and/or use of iterative reconstruction technique. COMPARISON:  None. FINDINGS: Brain: Normal anatomic configuration. No abnormal intra or extra-axial mass lesion or fluid collection. No abnormal mass effect or midline shift. No evidence of acute intracranial hemorrhage or infarct. Ventricular size is normal. Cerebellum unremarkable. Vascular: Unremarkable Skull: Intact Sinuses/Orbits: Paranasal sinuses are clear. Orbits are unremarkable. Other: Mastoid air cells and middle ear cavities are clear. IMPRESSION: Normal examination.  No acute intracranial abnormality. Electronically Signed   By: Fidela Salisbury M.D.   On: 01/12/2022 00:42   MR BRAIN WO CONTRAST  Result Date: 01/12/2022 CLINICAL DATA:  TIA, right arm weakness EXAM: MRI HEAD WITHOUT CONTRAST TECHNIQUE: Multiplanar, multiecho pulse sequences of the brain and surrounding structures were obtained without intravenous contrast. COMPARISON:  None. FINDINGS: Brain: There is no acute infarction or intracranial hemorrhage. There is no intracranial mass, mass effect, or edema. There is no hydrocephalus or extra-axial fluid collection. Ventricles and sulci are normal in size and configuration. Patchy foci of T2 hyperintensity in the supratentorial white matter are nonspecific but probably reflect mild chronic microvascular ischemic changes.  Vascular: Major vessel flow voids at the skull base are preserved. Skull and upper cervical spine: Normal marrow signal is preserved. Sinuses/Orbits: Paranasal sinuses are aerated. Orbits are unremarkable. Other: Sella is unremarkable.  Mastoid air cells are clear. IMPRESSION: No acute infarction, hemorrhage, or mass. Mild chronic microvascular ischemic changes. Electronically Signed   By: Macy Mis M.D.   On: 01/12/2022 13:42   ECHOCARDIOGRAM COMPLETE  Result Date: 01/13/2022    ECHOCARDIOGRAM REPORT   Patient Name:   Catherine Patrick Date of Exam: 01/13/2022 Medical Rec #:  [id number]    Height:       57.0 in Accession #:    [id number]   Weight:  170.0 lb Date of Birth:  October 18, 1966     BSA:          1.679 m Patient Age:    36 years     BP:           130/98 mmHg Patient Gender: F            HR:           91 bpm. Exam Location:  Inpatient Procedure: 2D Echo Indications:    TIA  History:        Patient has no prior history of Echocardiogram examinations.                 Risk Factors:Hypertension.  Sonographer:    Arlyss Gandy Referring Phys: [id number] DAVID MANUEL [place]  1. Left ventricular ejection fraction, by estimation, is 60 to 65%. The left ventricle has normal function. The left ventricle has no regional wall motion abnormalities. Left ventricular diastolic parameters were normal.  2. Right ventricular systolic function is normal. The right ventricular size is normal. There is normal pulmonary artery systolic pressure.  3. The mitral valve is normal in structure. Trivial mitral valve regurgitation. No evidence of mitral stenosis.  4. The aortic valve is tricuspid. There is mild calcification of the aortic valve. Aortic valve regurgitation is not visualized. No aortic stenosis is present.  5. The inferior vena cava is normal in size with greater than 50% respiratory variability, suggesting right atrial pressure of 3 mmHg. Comparison(s): No prior Echocardiogram. Conclusion(s)/Recommendation(s):  Normal biventricular function without evidence of hemodynamically significant valvular heart disease. No intracardiac source of embolism detected on this transthoracic study. Consider a transesophageal echocardiogram to exclude cardiac source of embolism if clinically indicated. FINDINGS  Left Ventricle: Left ventricular ejection fraction, by estimation, is 60 to 65%. The left ventricle has normal function. The left ventricle has no regional wall motion abnormalities. The left ventricular internal cavity size was normal in size. There is  no left ventricular hypertrophy. Left ventricular diastolic parameters were normal. Right Ventricle: The right ventricular size is normal. No increase in right ventricular wall thickness. Right ventricular systolic function is normal. There is normal pulmonary artery systolic pressure. The tricuspid regurgitant velocity is 2.51 m/s, and  with an assumed right atrial pressure of 3 mmHg, the estimated right ventricular systolic pressure is [id number] mmHg. Left Atrium: Left atrial size was normal in size. Right Atrium: Right atrial size was normal in size. Pericardium: There is no evidence of pericardial effusion. Mitral Valve: The mitral valve is normal in structure. Trivial mitral valve regurgitation. No evidence of mitral valve stenosis. Tricuspid Valve: The tricuspid valve is normal in structure. Tricuspid valve regurgitation is mild . No evidence of tricuspid stenosis. Aortic Valve: The aortic valve is tricuspid. There is mild calcification of the aortic valve. Aortic valve regurgitation is not visualized. No aortic stenosis is present. Aortic valve mean gradient measures 5.0 mmHg. Aortic valve peak gradient measures 9.5 mmHg. Aortic valve area, by VTI measures 1.88 cm. Pulmonic Valve: The pulmonic valve was grossly normal. Pulmonic valve regurgitation is mild. No evidence of pulmonic stenosis. Aorta: The aortic root, ascending aorta, aortic arch and descending aorta are all  structurally normal, with no evidence of dilitation or obstruction. Venous: The inferior vena cava is normal in size with greater than 50% respiratory variability, suggesting right atrial pressure of 3 mmHg. IAS/Shunts: The atrial septum is grossly normal.  LEFT VENTRICLE PLAX 2D LVIDd:  3.60 cm   Diastology LVIDs:         2.40 cm   LV e' medial:    7.83 cm/s LV PW:         0.70 cm   LV E/e' medial:  8.8 LV IVS:        0.70 cm   LV e' lateral:   12.70 cm/s LVOT diam:     2.00 cm   LV E/e' lateral: 5.4 LV SV:         58 LV SV Index:   34 LVOT Area:     3.14 cm  RIGHT VENTRICLE            IVC RV Basal diam:  2.60 cm    IVC diam: 1.40 cm RV Mid diam:    2.10 cm RV S prime:     8.49 cm/s TAPSE (M-mode): 1.4 cm LEFT ATRIUM             Index        RIGHT ATRIUM          Index LA diam:        2.80 cm 1.67 cm/m   RA Area:     9.61 cm LA Vol (A2C):   36.2 ml 21.57 ml/m  RA Volume:   17.70 ml 10.54 ml/m LA Vol (A4C):   28.6 ml 17.04 ml/m LA Biplane Vol: 33.5 ml 19.96 ml/m  AORTIC VALVE AV Area (Vmax):    1.96 cm AV Area (Vmean):   1.85 cm AV Area (VTI):     1.88 cm AV Vmax:           154.00 cm/s AV Vmean:          103.000 cm/s AV VTI:            0.307 m AV Peak Grad:      9.5 mmHg AV Mean Grad:      5.0 mmHg LVOT Vmax:         96.00 cm/s LVOT Vmean:        60.700 cm/s LVOT VTI:          0.184 m LVOT/AV VTI ratio: 0.60  AORTA Ao Root diam: 2.60 cm Ao Asc diam:  2.90 cm MITRAL VALVE               TRICUSPID VALVE MV Area (PHT): 3.42 cm    TR Peak grad:   25.2 mmHg MV Decel Time: 222 msec    TR Vmax:        251.00 cm/s MV E velocity: 69.00 cm/s MV A velocity: 53.60 cm/s  SHUNTS MV E/A ratio:  1.29        Systemic VTI:  0.18 m                            Systemic Diam: 2.00 cm Buford Dresser MD Electronically signed by Buford Dresser MD Signature Date/Time: 01/13/2022/11:52:21 AM    Final    VAS US CAROTID (at [facility] and [facility] only)  Result Date: 01/12/2022 Carotid Arterial Duplex Study Patient Name:   Catherine Patrick  Date of Exam:   01/12/2022 Medical Rec #: [id number]     Accession #:    [id number] Date of Birth: 1966-06-24      Patient Gender: F Patient Age:   17 years Exam Location:  [facility] Procedure:      VAS US CAROTID Referring Phys: Shanon Brow ORTIZ --------------------------------------------------------------------------------  Indications:       TIA. Risk Factors:      Hypertension. Comparison Study:  No prior study Performing Technologist: Maudry Mayhew MHA, RDMS, RVT, RDCS  Examination Guidelines: A complete evaluation includes B-mode imaging, spectral Doppler, color Doppler, and power Doppler as needed of all accessible portions of each vessel. Bilateral testing is considered an integral part of a complete examination. Limited examinations for reoccurring indications may be performed as noted.  Right Carotid Findings: +----------+--------+--------+--------+------------------+------------------+             PSV cm/s EDV cm/s Stenosis Plaque Description Comments            +----------+--------+--------+--------+------------------+------------------+  CCA Prox   66       21                                                       +----------+--------+--------+--------+------------------+------------------+  CCA Distal 65       27                                   intimal thickening  +----------+--------+--------+--------+------------------+------------------+  ICA Prox   49       23                                                       +----------+--------+--------+--------+------------------+------------------+  ICA Distal 62       30                                                       +----------+--------+--------+--------+------------------+------------------+  ECA        54       10                                                       +----------+--------+--------+--------+------------------+------------------+ +----------+--------+-------+----------------+-------------------+             PSV  cm/s EDV cms Describe         Arm Pressure (mmHG)  +----------+--------+-------+----------------+-------------------+  Subclavian 104              Multiphasic, WNL                      +----------+--------+-------+----------------+-------------------+ +---------+--------+--+--------+--+---------+  Vertebral PSV cm/s 51 EDV cm/s 24 Antegrade  +---------+--------+--+--------+--+---------+  Left Carotid Findings: +----------+--------+--------+--------+----------------------+--------+             PSV cm/s EDV cm/s Stenosis Plaque Description     Comments  +----------+--------+--------+--------+----------------------+--------+  CCA Prox   220      63  tortuous  +----------+--------+--------+--------+----------------------+--------+  CCA Distal 91       30                                                 +----------+--------+--------+--------+----------------------+--------+  ICA Prox   73       26                                                 +----------+--------+--------+--------+----------------------+--------+  ICA Distal 76       40                                                 +----------+--------+--------+--------+----------------------+--------+  ECA        55       14                smooth and hyperechoic           +----------+--------+--------+--------+----------------------+--------+ +----------+--------+--------+----------------+-------------------+             PSV cm/s EDV cm/s Describe         Arm Pressure (mmHG)  +----------+--------+--------+----------------+-------------------+  Subclavian 83                Multiphasic, WNL                      +----------+--------+--------+----------------+-------------------+ +---------+--------+--+--------+--+---------+  Vertebral PSV cm/s 77 EDV cm/s 37 Antegrade  +---------+--------+--+--------+--+---------+   Summary: Right Carotid: The extracranial vessels were near-normal with only minimal wall                thickening or  plaque. Left Carotid: The extracranial vessels were near-normal with only minimal wall               thickening or plaque. Vertebrals:  Bilateral vertebral arteries demonstrate antegrade flow. Subclavians: Normal flow hemodynamics were seen in bilateral subclavian              arteries. *See table(s) above for measurements and observations.  Electronically signed by Harold Barban MD on 01/12/2022 at 8:51:58 PM.    Final    CT ANGIO HEAD NECK W WO CM (CODE STROKE)  Result Date: 01/13/2022 CLINICAL DATA:  TIA EXAM: CT ANGIOGRAPHY HEAD AND NECK TECHNIQUE: Multidetector CT imaging of the head and neck was performed using the standard protocol during bolus administration of intravenous contrast. Multiplanar CT image reconstructions and MIPs were obtained to evaluate the vascular anatomy. Carotid stenosis measurements (when applicable) are obtained utilizing NASCET criteria, using the distal internal carotid diameter as the denominator. RADIATION DOSE REDUCTION: This exam was performed according to the departmental dose-optimization program which includes automated exposure control, adjustment of the mA and/or kV according to patient size and/or use of iterative reconstruction technique. CONTRAST:  41mL OMNIPAQUE IOHEXOL 350 MG/ML SOLN COMPARISON:  None. FINDINGS: CTA NECK Aortic arch: Great vessel origins are patent. Right carotid system: Patent.  No stenosis. Left carotid system: Patent.  No stenosis. Vertebral arteries: Patent.  Left vertebral is dominant. Skeleton: Mild cervical spine degenerative changes primarily at C5-C6. Other neck: Unremarkable. Upper  chest: No apical lung mass. Review of the MIP images confirms the above findings CTA HEAD Anterior circulation: Intracranial internal carotid arteries are patent. Anterior and middle cerebral arteries are patent. Posterior circulation: Intracranial vertebral arteries are patent. Basilar artery is patent. Major cerebellar artery origins are patent. Posterior  cerebral arteries are patent. Venous sinuses: Patent as allowed by contrast bolus timing. Review of the MIP images confirms the above findings IMPRESSION: No large vessel occlusion, hemodynamically significant stenosis, or evidence of dissection. Electronically Signed   By: Macy Mis M.D.   On: 01/13/2022 15:56       Hosie Poisson M.D. [facility] 01/13/2022, 6:03 PM  Available via Epic secure chat 7am-7pm After 7 pm, please refer to night coverage provider listed on amion.

## 2022-01-13 NOTE — Assessment & Plan Note (Signed)
Pt not on meds.  Monitor overnight.

## 2022-01-13 NOTE — Assessment & Plan Note (Addendum)
Differential include psychogenic weakness.  So far work up is negative.  Neurology consulted and recommendations given.  Continue with aspirin and lipitor.  LDL elevated at 124.  Hemoglobin A1c is 5.1%

## 2022-01-13 NOTE — Consult Note (Addendum)
Neurology Consultation  Reason for Consult: TIA  Referring Physician: Dr. Karleen Hampshire  CC: Bilateral Arm weakness  History is obtained from:Patient  HPI: Catherine Patrick is a delightful 56 y.o. English and Arabic speaking female originally from [place], who has a PMHx of DJD, HA, urolithiasis and HTN presenting to ED with 10 minute episode of bilateral upper extremity weakness spreading from right arm to left, mutism, dizziness, and neck weakness, after which she states she fainted. Patient reports that on Tuesday at 6 PM, she got into her car after work and tried to start her car, when her right hand suddenly became unable to turn the ignition, followed by her right arm beoming flaccid. Patient then reports that she had sudden neck weakness to which she reports her head fell backwards and to her right side with flaccid weakness, which was then followed by her left arm becoming weak. Patient then reports that when she tried to call for help, she could not speak. Patient denies any LE involvement. Patient reports that her coworkers were able to assist her at that point. Patient reports remembering that she understood what the coworkers were saying but being unable to speak. Patient reports she was able to nod and shake her head, however. After 10 minutes, a coworker told patient to chew on a piece of gum and patient reports her symptoms began to spontaneously resolve. Her coworker had thought that the symptoms may have been due to hypoglycemia. Patient reports she had some dizziness that did not resolve following episode. At some point after the above attack, she states that she fainted and lost consciousness for about 1 minute before awakening again. Patient reports no prodrome prior to episode. Patient reports she had a minor headache that morning but it had resolved after taking a tylenol. Patient reports she has never experienced this before in the past. Patient denies ever having had a stroke or TIA in the  past and denies any tachycardic episodes or chest pain.   Today, patient reports that her symptoms have largely resolved but still reports dizziness. Patient unable to clearly verbalize but describes the room spinning even when sitting. Patient denies it worsening when standing.   Patient reports hx of headaches only when she does not have her morning tea with milk. Upon chart review, patient did have an ED visit in February 2020 for vertigo as well as diagnosis of eustachian tube dysfunction in January 2019.   LKW: Tuesday 6 PM TNK given?: no, symptoms resolved IR Thrombectomy? No, no LVO Modified Rankin Scale: 0-Completely asymptomatic and back to baseline post- stroke  ROS: A complete ROS was performed and is negative except as noted in the HPI.   Past Medical History:  Diagnosis Date   DJD (degenerative joint disease)    Headache    History of blood transfusion    2 times after child birth   History of kidney stones    passed one in 2014- 2017   HTN (hypertension) 11/10/2016   Hypertension    Kidney stones      Family History  Problem Relation Age of Onset   Diabetes Father    Kidney disease Father    Diabetes Brother    Cancer Neg Hx    Heart failure Neg Hx    Hyperlipidemia Neg Hx    Hypertension Neg Hx      Social History:   reports that she has never smoked. She has never used smokeless tobacco. She reports that she does not  drink alcohol and does not use drugs.  Home Medications: No current facility-administered medications on file prior to encounter.   Current Outpatient Medications on File Prior to Encounter  Medication Sig Dispense Refill   Cholecalciferol (VITAMIN D-3) 25 MCG (1000 UT) CAPS Take 1,000 Units by mouth daily.     acetaminophen (TYLENOL 8 HOUR) 650 MG CR tablet Take 1 tablet (650 mg total) by mouth every 8 (eight) hours as needed. (Patient not taking: Reported on 01/12/2022) 30 tablet 0   cetirizine (ZYRTEC) 10 MG tablet Take 1 tablet (10 mg  total) by mouth daily. (Patient not taking: Reported on 01/12/2022) 30 tablet 11   fluticasone (FLONASE) 50 MCG/ACT nasal spray Place 2 sprays into both nostrils daily. (Patient not taking: Reported on 01/12/2022) 16 g 6   ibuprofen (ADVIL) 800 MG tablet Take 1 tablet (800 mg total) by mouth every 8 (eight) hours as needed. (Patient not taking: Reported on 01/12/2022) 30 tablet 2   meclizine (ANTIVERT) 12.5 MG tablet Take 1 tablet (12.5 mg total) by mouth 4 (four) times daily as needed for dizziness. (Patient not taking: Reported on 01/12/2022) 45 tablet 1     Inpatient Medications  Current Facility-Administered Medications:    acetaminophen (TYLENOL) tablet 650 mg, 650 mg, Oral, Q6H PRN, 650 mg at 01/12/22 1111 **OR** acetaminophen (TYLENOL) suppository 650 mg, 650 mg, Rectal, Q6H PRN, Reubin Milan, MD   aspirin tablet 325 mg, 325 mg, Oral, Daily, Hosie Poisson, MD, 325 mg at 01/13/22 1544   atorvastatin (LIPITOR) tablet 40 mg, 40 mg, Oral, Daily, Hosie Poisson, MD, 40 mg at 01/13/22 1544   ondansetron (ZOFRAN) tablet 4 mg, 4 mg, Oral, Q6H PRN **OR** ondansetron (ZOFRAN) injection 4 mg, 4 mg, Intravenous, Q6H PRN, Reubin Milan, MD   phenazopyridine (PYRIDIUM) tablet 200 mg, 200 mg, Oral, TID WC, Reubin Milan, MD, 200 mg at 01/13/22 1544   Exam: Current vital signs: BP (!) 139/98 (BP Location: Left Arm)    Pulse 89    Temp 98.1 F (36.7 C) (Oral)    Resp 14    Ht 4\' 9"  (1.448 m)    Wt 77.1 kg    LMP 03/19/2014    SpO2 99%    BMI 36.79 kg/m  Vital signs in last 24 hours: Temp:  [97.6 F (36.4 C)-98.3 F (36.8 C)] 98.1 F (36.7 C) (02/09 1602) Pulse Rate:  [77-98] 89 (02/09 1602) Resp:  [14-19] 14 (02/09 1602) BP: (115-139)/(74-98) 139/98 (02/09 1602) SpO2:  [96 %-100 %] 99 % (02/09 1602)  GENERAL: Awake, alert, in no acute distress Psych: Affect appropriate for situation, patient is calm and cooperative with examination Head: Normocephalic and atraumatic, without obvious  abnormality EENT: Normal conjunctivae, dry mucous membranes, no OP obstruction LUNGS: Normal respiratory effort. Non-labored breathing on room air CV: Regular rate and rhythm on telemetry ABDOMEN: Soft, non-tender, non-distended Extremities: warm, well perfused, without obvious deformity  NEURO:  Mental Status: Awake, alert, and oriented to person, place, time, and situation. She is able to provide a clear and coherent history of present illness. Speech/Language: speech is appropriate and coherent Naming, repetition, fluency, and comprehension intact without aphasia  No neglect is noted Cranial Nerves:  II: PERRL. visual fields full.  III, IV, VI: EOMI. Lid elevation symmetric and full.  V: Sensation is intact to light touch and symmetrical to face. Blinks to threat. Moves jaw back and forth.  VII: Face is symmetric resting and smiling. Able to puff cheeks and raise eyebrows.  VIII: Hearing intact to voice IX, X: Palate elevation is symmetric. Phonation normal.  XI: Normal sternocleidomastoid and trapezius muscle strength XII: Tongue protrudes midline without fasciculations.   Motor: 5/5 strength is all muscle groups.  Tone is normal. Bulk is normal.  Sensation: Intact to light touch bilaterally in all four extremities. No extinction to DSS present.  Coordination: FTN intact bilaterally. HKS intact bilaterally. No pronator drift. Alternating hand movements.  DTRs: 2+ throughout.  Gait: Deferred   Labs I have reviewed labs in epic and the results pertinent to this consultation are: A1c 5.1  CBC    Component Value Date/Time   WBC 8.8 01/12/2022 0002   RBC 4.82 01/12/2022 0002   HGB 14.2 01/12/2022 0002   HGB 13.6 01/28/2019 1044   HCT 44.2 01/12/2022 0002   HCT 41.5 01/28/2019 1044   PLT 233 01/12/2022 0002   PLT 222 01/28/2019 1044   MCV 91.7 01/12/2022 0002   MCV 89 01/28/2019 1044   MCH 29.5 01/12/2022 0002   MCHC 32.1 01/12/2022 0002   RDW 13.4 01/12/2022 0002    RDW 13.8 01/28/2019 1044   LYMPHSABS 3.9 01/12/2022 0002   LYMPHSABS 3.9 (H) 01/28/2019 1044   MONOABS 0.6 01/12/2022 0002   EOSABS 0.1 01/12/2022 0002   EOSABS 0.1 01/28/2019 1044   BASOSABS 0.1 01/12/2022 0002   BASOSABS 0.0 01/28/2019 1044    CMP     Component Value Date/Time   NA 139 01/12/2022 0002   NA 144 01/28/2019 1044   K 3.9 01/12/2022 0002   CL 106 01/12/2022 0002   CO2 26 01/12/2022 0002   GLUCOSE 83 01/12/2022 0002   BUN 17 01/12/2022 0002   BUN 15 01/28/2019 1044   CREATININE 0.47 01/12/2022 0002   CREATININE 0.68 11/10/2016 1225   CALCIUM 9.4 01/12/2022 0002   PROT 7.5 01/12/2022 0002   ALBUMIN 3.8 01/12/2022 0002   AST 25 01/12/2022 0002   ALT 19 01/12/2022 0002   ALKPHOS 104 01/12/2022 0002   BILITOT 0.5 01/12/2022 0002   GFRNONAA >60 01/12/2022 0002   GFRNONAA >89 06/11/2015 1248   GFRAA >60 03/31/2020 1212   GFRAA >89 06/11/2015 1248    Lipid Panel     Component Value Date/Time   CHOL 197 01/12/2022 0809   TRIG 63 01/12/2022 0809   HDL 60 01/12/2022 0809   CHOLHDL 3.3 01/12/2022 0809   VLDL 13 01/12/2022 0809   LDLCALC 124 (H) 01/12/2022 0809     Imaging I have reviewed the images obtained:   Assessment: Catherine Patrick is a 56 y.o. female w/ hx of DJD, HA, urolithiasis, HTN presenting to ED with 10 minute episode of bilateral upper extremity weakness, mutism, dizziness, and neck weakness. - Neurological exam is nonfocal - CT-scan of the brain: no acute intracranial abnormalities - MRI examination of the brain: no acute infarction, hemorrhage, or mass. Mild chronic microvascular ischemic changes. - CTA Head/Neck: No large vessel occlusion, hemodynamically significant stenosis, or evidence of dissection - Echo: EF 60-65% - Carotid Doppler: minimal wall thickening/plaque bilaterally - Most likely component of the DDx is felt to be a brief period of sustained hypotension associated with diffuse weakness, which is suggested by her subsequent  syncope; this could be due to an arrhythmia or autonomic dysfunction. Lower on the DDx would be hypoglycemia, given that she improved after chewing gum. Her symptoms would be very unusual for stroke, given spread from one limb to another and the atypical symptom of neck weakness. Psychogenic weakness due  to anxiety should also be considered. She did have some diffuse visual haziness during the symptoms which would be more consistent with hypotension, but also could occur as a result of complex migraine.   Recommendations: -Statin and risk factor modification -Cardiac monitoring. If negative, may benefit from loop recorder or Holter monitor -Cardiology consult to assess for possible underlying arrhythmia leading to syncope -ASA 81 mg qd -Orthostatic Vitals  France Ravens, MD PGY1 Resident  I have seen and examined the patient. I have reviewed the assessment and recommendations and made amendations as needed. 56 y.o. female w/ hx of DJD, HA, urolithiasis, HTN presenting to ED with 10 minute episode of bilateral upper extremity weakness, mutism, dizziness, and neck weakness. Imaging negative for stroke. Exam is nonfocal. DDx and further diagnostic plan as documented above.  Electronically signed: Dr. Kerney Elbe

## 2022-01-13 NOTE — Progress Notes (Signed)
Echocardiogram 2D Echocardiogram has been performed.  Catherine Patrick 01/13/2022, 8:35 AM

## 2022-01-14 DIAGNOSIS — I10 Essential (primary) hypertension: Secondary | ICD-10-CM | POA: Diagnosis not present

## 2022-01-14 DIAGNOSIS — G459 Transient cerebral ischemic attack, unspecified: Secondary | ICD-10-CM | POA: Diagnosis not present

## 2022-01-14 DIAGNOSIS — R531 Weakness: Secondary | ICD-10-CM

## 2022-01-14 MED ORDER — ATORVASTATIN CALCIUM 40 MG PO TABS: 40.0000 mg | ORAL_TABLET | Freq: Every day | ORAL | 1 refills | Status: DC

## 2022-01-14 MED ORDER — LISINOPRIL 10 MG PO TABS: 10.0000 mg | ORAL_TABLET | Freq: Every day | ORAL | 1 refills | Status: DC

## 2022-01-14 MED ORDER — LISINOPRIL 10 MG PO TABS
10.0000 mg | ORAL_TABLET | Freq: Every day | ORAL | Status: DC
  Administered 2022-01-14: 10 mg via ORAL
  Filled 2022-01-14: qty 1

## 2022-01-14 MED ORDER — ASPIRIN EC 81 MG PO TBEC
81.0000 mg | DELAYED_RELEASE_TABLET | Freq: Every day | ORAL | Status: DC
  Administered 2022-01-14: 81 mg via ORAL
  Filled 2022-01-14: qty 1

## 2022-01-14 NOTE — Progress Notes (Signed)
°  Transition of Care [facility]) Screening Note   Patient Details  Name: Paislynn Rheams Date of Birth: 11/24/1966   Transition of Care [facility]) CM/SW Contact:    Geralynn Ochs, LCSW Phone Number: 01/14/2022, 9:10 AM    Transition of Care Department [facility]) has reviewed patient and no TOC needs have been identified at this time. We will continue to monitor patient advancement through interdisciplinary progression rounds. If new patient transition needs arise, please place a TOC consult.

## 2022-01-14 NOTE — Progress Notes (Addendum)
Neurology Progress Note  Brief HPI: Catherine Patrick is a 56 y.o. female w/ hx of DJD, HA, urolithiasis, HTN presenting to ED with 10 minute episode of bilateral upper extremity weakness, mutism, dizziness, and neck weakness. Patient's MRI/CT/CTA showed no acute abnormalities. Echo EF 60-65%.   Subjective: Patient seen at bedside. No family in room. Patient reports she is back to baseline. Patient does report significant anxiety and stress at home related to family for past 6 months, sleeping <5 hours per night, and limited nutrition.    Exam: Vitals:   01/14/22 0353 01/14/22 0747  BP: 133/86 (!) 151/103  Pulse: 79 83  Resp: 16 16  Temp: 98 F (36.7 C) 98.4 F (36.9 C)  SpO2: 100% 99%   Gen: In bed, NAD Resp: non-labored breathing, no acute distress Abd: soft, nt  Neuro: Mental Status: Awake, alert, and oriented to person, place, time, and situation. She is able to provide a clear and coherent history of present illness. Speech/Language: speech is appropriate and coherent Naming, repetition, fluency, and comprehension intact without aphasia  No neglect is noted Cranial Nerves:  II: PERRL. visual fields full.  III, IV, VI: EOMI. Lid elevation symmetric and full.  V: Sensation is intact to light touch and symmetrical to face. Blinks to threat. Moves jaw back and forth.  VII: Face is symmetric resting and smiling. Able to puff cheeks and raise eyebrows.  VIII: Hearing intact to voice IX, X: Palate elevation is symmetric. Phonation normal.  XI: Normal sternocleidomastoid and trapezius muscle strength XII: Tongue protrudes midline without fasciculations.   Motor: 5/5 strength is all muscle groups.  Tone is normal. Bulk is normal.  Sensation: Intact to light touch bilaterally in all four extremities. No extinction to DSS present.  Coordination: FTN intact bilaterally. HKS intact bilaterally. No pronator drift. Alternating hand movements.  DTRs: 2+ throughout.  Gait:  Deferred  Pertinent Labs: CBC    Component Value Date/Time   WBC 8.8 01/12/2022 0002   RBC 4.82 01/12/2022 0002   HGB 14.2 01/12/2022 0002   HGB 13.6 01/28/2019 1044   HCT 44.2 01/12/2022 0002   HCT 41.5 01/28/2019 1044   PLT 233 01/12/2022 0002   PLT 222 01/28/2019 1044   MCV 91.7 01/12/2022 0002   MCV 89 01/28/2019 1044   MCH 29.5 01/12/2022 0002   MCHC 32.1 01/12/2022 0002   RDW 13.4 01/12/2022 0002   RDW 13.8 01/28/2019 1044   LYMPHSABS 3.9 01/12/2022 0002   LYMPHSABS 3.9 (H) 01/28/2019 1044   MONOABS 0.6 01/12/2022 0002   EOSABS 0.1 01/12/2022 0002   EOSABS 0.1 01/28/2019 1044   BASOSABS 0.1 01/12/2022 0002   BASOSABS 0.0 01/28/2019 1044   CMP Latest Ref Rng & Units 01/12/2022 03/31/2020 01/28/2019  Glucose 70 - 99 mg/dL 83 98 88  BUN 6 - 20 mg/dL 17 18 15   Creatinine 0.44 - 1.00 mg/dL 9.48 5.46  Sodium 135 - 145 mmol/L 139 138 144  Potassium 3.5 - 5.1 mmol/L 3.9 3.7 4.4  Chloride 98 - 111 mmol/L 106 103 108(H)  CO2 22 - 32 mmol/L 26 25 18(L)  Calcium 8.9 - 10.3 mg/dL 9.4 2.70) 9.3  Total Protein 6.5 - 8.1 g/dL 7.5 7.6 -  Total Bilirubin 0.3 - 1.2 mg/dL 0.5 0.5 -  Alkaline Phos 38 - 126 U/L 104 120 -  AST 15 - 41 U/L 25 28 -  ALT 0 - 44 U/L 19 25 -  Lipid Panel     Component Value Date/Time  CHOL 197 01/12/2022 0809   TRIG 63 01/12/2022 0809   HDL 60 01/12/2022 0809   CHOLHDL 3.3 01/12/2022 0809   VLDL 13 01/12/2022 0809   LDLCALC 124 (H) 01/12/2022 0809    Imaging Reviewed: - MRI examination of the brain: no acute infarction, hemorrhage, or mass. Mild chronic microvascular ischemic changes. - CTA Head/Neck: No large vessel occlusion, hemodynamically significant stenosis, or evidence of dissection - Echo: EF 60-65% - Carotid Doppler: minimal wall thickening/plaque bilaterally  Assessment: Catherine Patrick is a 56 y.o. female w/ hx of DJD, HA, urolithiasis, HTN presenting to ED with 10 minute episode of bilateral upper extremity weakness, mutism,  dizziness, and neck weakness.  Impression: Syncopal episode 2/2 sustained hypotension vs Psychogenic weakness vs hypoglycemia  Recommendations: 1) Syncope workup per IM  2) Neuro signing off. Please contact us for any further questions.  Park Pope, MD PGY1 Resident  I have seen the patient and reviewed the above note.  Her symptoms are not really consistent with TIA/CVA given the bilateral nature.  I agree with Dr. Otelia Limes that this could have been a transient presyncopal type episode.  At this time, I do not have any further neurodiagnostic testing to recommend, no need for antiplatelet medicine and statin only if indicated per internal medicine.  No further recommendations at this time, follow-up with outpatient neurology only if she continues to have further spells.  Ritta Slot, MD [facility] [phone]  If 7pm- 7am, please page neurology on call as listed in [facility]ION.

## 2022-01-14 NOTE — TOC Transition Note (Signed)
Transition of Care [facility]) - CM/SW Discharge Note   Patient Details  Name: Catherine Patrick MRN: [id number] Date of Birth: [birth year]  Transition of Care [facility]) CM/SW Contact:  Kermit Balo, RN Phone Number: 01/14/2022, 4:12 PM   Clinical Narrative:    Patient is discharging home with self care. No f/u per PT/OT and no DME needs.  Pt has transportation home.   Final next level of care: Home/Self Care Barriers to Discharge: No Barriers Identified   Patient Goals and CMS Choice        Discharge Placement                       Discharge Plan and Services                                     Social Determinants of Health (SDOH) Interventions     Readmission Risk Interventions No flowsheet data found.

## 2022-01-14 NOTE — Consult Note (Addendum)
Cardiology Consultation:   Patient ID: Catherine Patrick MRN: [id number]; DOB: 22-Jul-1966  Admit date: 01/11/2022 Date of Consult: 01/14/2022  PCP:  Ladell Pier, MD   [place] Providers Cardiologist:  New to [facility]   Patient Profile:   Catherine Patrick is a 56 y.o. female with a hx of hypertension (not on antihypertensive), and kidney stone who is being seen 01/14/2022 for the evaluation of possible syncope at the request of Dr. Karleen Hampshire.  History of Present Illness:   Catherine Patrick was in usual state of health up until 6 PM 2/7//2023 when had sudden onset issue.  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  She tried to put car in ignition but noted that she cannot move her right arm and it felt heavy.  Then had difficulty speaking.  She slumped backward with dizzy feeling and may be lost conscious for less than few seconds but unsure.  Then she had a heaviness on her left arm but able to blow constant horn to get attention from coworker.  Patient was able to node her head.  Her coworker thought this could be due to low blood sugar (did not check it) and give her gum to chew.  Slowly after that ,she was able to speak but not clearly.  Total duration of symptoms about 15 minutes.  Her coworker drove patient to home where her blood sugar was normal on check.  Son brought patient to ER given symptoms concerning for stroke.  Upon arrival, blood pressure 161/113.  She was able to move her extremities.  Speech clear.  She was admitted for possible TIA.  CT of the head negative.  MRI and CTA without acute abnormality.  Echo with preserved LV function.  Patient was evaluated by neurology and did not felt symptoms related to TIA.  It was felt transient presyncope type episode. Now cardiology asked for further evaluation.  Patient denies similar episode in past.  No associated palpitation or chest pain during her episode.  No regular exercise.  Denies tobacco smoking or illicit drug use.  No alcohol abuse.  Brother  with history of stroke.   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   Procedure: RIGHT TOTAL KNEE ARTHROPLASTY;  Surgeon: Leandrew Koyanagi, MD;  Location: [place];  Service: Orthopedics;  Laterality: Right;   TOTAL KNEE ARTHROPLASTY Left 03/16/2017   Procedure: LEFT TOTAL KNEE ARTHROPLASTY;  Surgeon: Leandrew Koyanagi, MD;  Location: [place];  Service: Orthopedics;  Laterality: Left;     Inpatient Medications: Scheduled Meds:  aspirin EC  81 mg Oral Daily   atorvastatin  40 mg Oral Daily   lisinopril  10 mg Oral Daily   Continuous Infusions:  PRN Meds: acetaminophen **OR** acetaminophen, ondansetron **OR** ondansetron (ZOFRAN) IV  Allergies:    Allergies  Allergen Reactions   Amlodipine Palpitations and Other (See Comments)    WITH FIRST DOSES BEGAN AFTER EACH DOSE- RESOLVED WHEN D/C'd    Pork-Derived Products  Social History:   Social History   Socioeconomic History   Marital status: Single    Spouse name: Not on file   Number of children: Not on file   Years of education: Not on file   Highest education level: Not on file  Occupational History   Not on file  Tobacco Use   Smoking status: Never   Smokeless tobacco: Never  Substance and Sexual Activity   Alcohol use: No   Drug use: No   Sexual activity: Yes    Birth control/protection: None  Other Topics Concern   Not on file  Social History Narrative   Not on file   Social Determinants of Health   Financial Resource Strain: Not on file  Food Insecurity: Not on file  Transportation Needs: Not on file  Physical  Activity: Not on file  Stress: Not on file  Social Connections: Not on file  Intimate Partner Violence: Not on file    Family History:   Family History  Problem Relation Age of Onset   Diabetes Father    Kidney disease Father    Diabetes Brother    Cancer Neg Hx    Heart failure Neg Hx    Hyperlipidemia Neg Hx    Hypertension Neg Hx      ROS:  Please see the history of present illness.  All other ROS reviewed and negative.     Physical Exam/Data:   Vitals:   01/13/22 2317 01/14/22 0353 01/14/22 0747 01/14/22 1128  BP: 115/76 133/86 (!) 151/103 138/89  Pulse: 69 79 83 74  Resp: 17 16 16 16   Temp: 97.9 F (36.6 C) 98 F (36.7 C) 98.4 F (36.9 C) 97.7 F (36.5 C)  TempSrc: Oral Oral Oral Oral  SpO2: 95% 100% 99% 95%  Weight:      Height:       No intake or output data in the 24 hours ending 01/14/22 1405 Last 3 Weights 01/11/2022 05/11/2021 11/23/2020  Weight (lbs) 170 lb 169 lb 172 lb  Weight (kg) 77.111 kg 76.658 kg 78.019 kg     Body mass index is 36.79 kg/m.  General:  Well nourished, well developed, in no acute distress HEENT: normal Neck: no JVD Vascular: No carotid bruits; Distal pulses 2+ bilaterally Cardiac:  normal S1, S2; RRR; no murmur  Lungs:  clear to auscultation bilaterally, no wheezing, rhonchi or rales  Abd: soft, nontender, no hepatomegaly  Ext: no edema Musculoskeletal:  No deformities, BUE and BLE strength normal and equal Skin: warm and dry  Neuro:  CNs 2-12 intact, no focal abnormalities noted Psych:  Normal affect   EKG:  The EKG was personally reviewed and demonstrates: Sinus rhythm, PAC Telemetry:  Telemetry was personally reviewed and demonstrates: Sinus rhythm at controlled rate, intermittent sinus tachycardia at 120  Relevant CV Studies:  Echo 01/13/22 1. Left ventricular ejection fraction, by estimation, is 60 to 65%. The  left ventricle has normal function. The left ventricle has no regional  wall motion abnormalities. Left  ventricular diastolic parameters were  normal.   2. Right ventricular systolic function is normal. The right ventricular  size is normal. There is normal pulmonary artery systolic pressure.   3. The mitral valve is normal in structure. Trivial mitral valve  regurgitation. No evidence of mitral stenosis.   4. The aortic valve is tricuspid. There is mild calcification of the  aortic valve. Aortic valve regurgitation is not visualized. No aortic  stenosis is present.   5.  The inferior vena cava is normal in size with greater than 50%  respiratory variability, suggesting right atrial pressure of 3 mmHg.   Comparison(s): No prior Echocardiogram.   Conclusion(s)/Recommendation(s): Normal biventricular function without  evidence of hemodynamically significant valvular heart disease. No  intracardiac source of embolism detected on this transthoracic study.  Consider a transesophageal echocardiogram to  exclude cardiac source of embolism if clinically indicated.   Laboratory Data:  High Sensitivity Troponin:  No results for input(s): TROPONINIHS in the last 720 hours.   Chemistry Recent Labs  Lab 01/12/22 0002  NA 139  K 3.9  CL 106  CO2 26  GLUCOSE 83  BUN 17  CREATININE 0.47  CALCIUM 9.4  GFRNONAA >60  ANIONGAP 7    Recent Labs  Lab 01/12/22 0002  PROT 7.5  ALBUMIN 3.8  AST 25  ALT 19  ALKPHOS 104  BILITOT 0.5   Lipids  Recent Labs  Lab 01/12/22 0809  CHOL 197  TRIG 63  HDL 60  LDLCALC 124*  CHOLHDL 3.3    Hematology Recent Labs  Lab 01/12/22 0002  WBC 8.8  RBC 4.82  HGB 14.2  HCT 44.2  MCV 91.7  MCH 29.5  MCHC 32.1  RDW 13.4  PLT 233    Radiology/Studies:  CT HEAD WO CONTRAST  Result Date: 01/12/2022 CLINICAL DATA:  Neuro deficit, acute, stroke suspected. Bilateral arm numbness. Headache. EXAM: CT HEAD WITHOUT CONTRAST TECHNIQUE: Contiguous axial images were obtained from the base of the skull through the vertex without intravenous contrast.  RADIATION DOSE REDUCTION: This exam was performed according to the departmental dose-optimization program which includes automated exposure control, adjustment of the mA and/or kV according to patient size and/or use of iterative reconstruction technique. COMPARISON:  None. FINDINGS: Brain: Normal anatomic configuration. No abnormal intra or extra-axial mass lesion or fluid collection. No abnormal mass effect or midline shift. No evidence of acute intracranial hemorrhage or infarct. Ventricular size is normal. Cerebellum unremarkable. Vascular: Unremarkable Skull: Intact Sinuses/Orbits: Paranasal sinuses are clear. Orbits are unremarkable. Other: Mastoid air cells and middle ear cavities are clear. IMPRESSION: Normal examination.  No acute intracranial abnormality. Electronically Signed   By: Fidela Salisbury M.D.   On: 01/12/2022 00:42   MR BRAIN WO CONTRAST  Result Date: 01/12/2022 CLINICAL DATA:  TIA, right arm weakness EXAM: MRI HEAD WITHOUT CONTRAST TECHNIQUE: Multiplanar, multiecho pulse sequences of the brain and surrounding structures were obtained without intravenous contrast. COMPARISON:  None. FINDINGS: Brain: There is no acute infarction or intracranial hemorrhage. There is no intracranial mass, mass effect, or edema. There is no hydrocephalus or extra-axial fluid collection. Ventricles and sulci are normal in size and configuration. Patchy foci of T2 hyperintensity in the supratentorial white matter are nonspecific but probably reflect mild chronic microvascular ischemic changes. Vascular: Major vessel flow voids at the skull base are preserved. Skull and upper cervical spine: Normal marrow signal is preserved. Sinuses/Orbits: Paranasal sinuses are aerated. Orbits are unremarkable. Other: Sella is unremarkable.  Mastoid air cells are clear. IMPRESSION: No acute infarction, hemorrhage, or mass. Mild chronic microvascular ischemic changes. Electronically Signed   By: Macy Mis M.D.   On: 01/12/2022  13:42   ECHOCARDIOGRAM COMPLETE  Result Date: 01/13/2022    ECHOCARDIOGRAM REPORT   Patient Name:   Catherine Patrick Date of Exam: 01/13/2022 Medical Rec #:  [id number]    Height:       57.0 in Accession #:    [id number]   Weight:  170.0 lb Date of Birth:  04-Mar-1966     BSA:          1.679 m Patient Age:    70 years     BP:           130/98 mmHg Patient Gender: F            HR:           91 bpm. Exam Location:  Inpatient Procedure: 2D Echo Indications:    TIA  History:        Patient has no prior history of Echocardiogram examinations.                 Risk Factors:Hypertension.  Sonographer:    Arlyss Gandy Referring Phys: [id number] DAVID MANUEL Ko Olina  1. Left ventricular ejection fraction, by estimation, is 60 to 65%. The left ventricle has normal function. The left ventricle has no regional wall motion abnormalities. Left ventricular diastolic parameters were normal.  2. Right ventricular systolic function is normal. The right ventricular size is normal. There is normal pulmonary artery systolic pressure.  3. The mitral valve is normal in structure. Trivial mitral valve regurgitation. No evidence of mitral stenosis.  4. The aortic valve is tricuspid. There is mild calcification of the aortic valve. Aortic valve regurgitation is not visualized. No aortic stenosis is present.  5. The inferior vena cava is normal in size with greater than 50% respiratory variability, suggesting right atrial pressure of 3 mmHg. Comparison(s): No prior Echocardiogram. Conclusion(s)/Recommendation(s): Normal biventricular function without evidence of hemodynamically significant valvular heart disease. No intracardiac source of embolism detected on this transthoracic study. Consider a transesophageal echocardiogram to exclude cardiac source of embolism if clinically indicated. FINDINGS  Left Ventricle: Left ventricular ejection fraction, by estimation, is 60 to 65%. The left ventricle has normal function. The left ventricle  has no regional wall motion abnormalities. The left ventricular internal cavity size was normal in size. There is  no left ventricular hypertrophy. Left ventricular diastolic parameters were normal. Right Ventricle: The right ventricular size is normal. No increase in right ventricular wall thickness. Right ventricular systolic function is normal. There is normal pulmonary artery systolic pressure. The tricuspid regurgitant velocity is 2.51 m/s, and  with an assumed right atrial pressure of 3 mmHg, the estimated right ventricular systolic pressure is [id number] mmHg. Left Atrium: Left atrial size was normal in size. Right Atrium: Right atrial size was normal in size. Pericardium: There is no evidence of pericardial effusion. Mitral Valve: The mitral valve is normal in structure. Trivial mitral valve regurgitation. No evidence of mitral valve stenosis. Tricuspid Valve: The tricuspid valve is normal in structure. Tricuspid valve regurgitation is mild . No evidence of tricuspid stenosis. Aortic Valve: The aortic valve is tricuspid. There is mild calcification of the aortic valve. Aortic valve regurgitation is not visualized. No aortic stenosis is present. Aortic valve mean gradient measures 5.0 mmHg. Aortic valve peak gradient measures 9.5 mmHg. Aortic valve area, by VTI measures 1.88 cm. Pulmonic Valve: The pulmonic valve was grossly normal. Pulmonic valve regurgitation is mild. No evidence of pulmonic stenosis. Aorta: The aortic root, ascending aorta, aortic arch and descending aorta are all structurally normal, with no evidence of dilitation or obstruction. Venous: The inferior vena cava is normal in size with greater than 50% respiratory variability, suggesting right atrial pressure of 3 mmHg. IAS/Shunts: The atrial septum is grossly normal.  LEFT VENTRICLE PLAX 2D LVIDd:         3.60  cm   Diastology LVIDs:         2.40 cm   LV e' medial:    7.83 cm/s LV PW:         0.70 cm   LV E/e' medial:  8.8 LV IVS:        0.70 cm    LV e' lateral:   12.70 cm/s LVOT diam:     2.00 cm   LV E/e' lateral: 5.4 LV SV:         58 LV SV Index:   34 LVOT Area:     3.14 cm  RIGHT VENTRICLE            IVC RV Basal diam:  2.60 cm    IVC diam: 1.40 cm RV Mid diam:    2.10 cm RV S prime:     8.49 cm/s TAPSE (M-mode): 1.4 cm LEFT ATRIUM             Index        RIGHT ATRIUM          Index LA diam:        2.80 cm 1.67 cm/m   RA Area:     9.61 cm LA Vol (A2C):   36.2 ml 21.57 ml/m  RA Volume:   17.70 ml 10.54 ml/m LA Vol (A4C):   28.6 ml 17.04 ml/m LA Biplane Vol: 33.5 ml 19.96 ml/m  AORTIC VALVE AV Area (Vmax):    1.96 cm AV Area (Vmean):   1.85 cm AV Area (VTI):     1.88 cm AV Vmax:           154.00 cm/s AV Vmean:          103.000 cm/s AV VTI:            0.307 m AV Peak Grad:      9.5 mmHg AV Mean Grad:      5.0 mmHg LVOT Vmax:         96.00 cm/s LVOT Vmean:        60.700 cm/s LVOT VTI:          0.184 m LVOT/AV VTI ratio: 0.60  AORTA Ao Root diam: 2.60 cm Ao Asc diam:  2.90 cm MITRAL VALVE               TRICUSPID VALVE MV Area (PHT): 3.42 cm    TR Peak grad:   25.2 mmHg MV Decel Time: 222 msec    TR Vmax:        251.00 cm/s MV E velocity: 69.00 cm/s MV A velocity: 53.60 cm/s  SHUNTS MV E/A ratio:  1.29        Systemic VTI:  0.18 m                            Systemic Diam: 2.00 cm Buford Dresser MD Electronically signed by Buford Dresser MD Signature Date/Time: 01/13/2022/11:52:21 AM    Final    VAS US CAROTID (at [facility] and [facility] only)  Result Date: 01/12/2022 Carotid Arterial Duplex Study Patient Name:  Catherine Patrick  Date of Exam:   01/12/2022 Medical Rec #: [id number]     Accession #:    [id number] Date of Birth: December 25, 1965      Patient Gender: F Patient Age:   1 years Exam Location:  [facility] Procedure:      VAS US CAROTID Referring Phys: Shanon Brow ORTIZ --------------------------------------------------------------------------------  Indications:  TIA. Risk Factors:      Hypertension. Comparison Study:  No prior study  Performing Technologist: Maudry Mayhew MHA, RDMS, RVT, RDCS  Examination Guidelines: A complete evaluation includes B-mode imaging, spectral Doppler, color Doppler, and power Doppler as needed of all accessible portions of each vessel. Bilateral testing is considered an integral part of a complete examination. Limited examinations for reoccurring indications may be performed as noted.  Right Carotid Findings: +----------+--------+--------+--------+------------------+------------------+             PSV cm/s EDV cm/s Stenosis Plaque Description Comments            +----------+--------+--------+--------+------------------+------------------+  CCA Prox   66       21                                                       +----------+--------+--------+--------+------------------+------------------+  CCA Distal 65       27                                   intimal thickening  +----------+--------+--------+--------+------------------+------------------+  ICA Prox   49       23                                                       +----------+--------+--------+--------+------------------+------------------+  ICA Distal 62       30                                                       +----------+--------+--------+--------+------------------+------------------+  ECA        54       10                                                       +----------+--------+--------+--------+------------------+------------------+ +----------+--------+-------+----------------+-------------------+             PSV cm/s EDV cms Describe         Arm Pressure (mmHG)  +----------+--------+-------+----------------+-------------------+  Subclavian 104              Multiphasic, WNL                      +----------+--------+-------+----------------+-------------------+ +---------+--------+--+--------+--+---------+  Vertebral PSV cm/s 51 EDV cm/s 24 Antegrade  +---------+--------+--+--------+--+---------+  Left Carotid Findings:  +----------+--------+--------+--------+----------------------+--------+             PSV cm/s EDV cm/s Stenosis Plaque Description     Comments  +----------+--------+--------+--------+----------------------+--------+  CCA Prox   220      63                                       tortuous  +----------+--------+--------+--------+----------------------+--------+  CCA  Distal 91       30                                                 +----------+--------+--------+--------+----------------------+--------+  ICA Prox   73       26                                                 +----------+--------+--------+--------+----------------------+--------+  ICA Distal 76       40                                                 +----------+--------+--------+--------+----------------------+--------+  ECA        55       14                smooth and hyperechoic           +----------+--------+--------+--------+----------------------+--------+ +----------+--------+--------+----------------+-------------------+             PSV cm/s EDV cm/s Describe         Arm Pressure (mmHG)  +----------+--------+--------+----------------+-------------------+  Subclavian 83                Multiphasic, WNL                      +----------+--------+--------+----------------+-------------------+ +---------+--------+--+--------+--+---------+  Vertebral PSV cm/s 77 EDV cm/s 37 Antegrade  +---------+--------+--+--------+--+---------+   Summary: Right Carotid: The extracranial vessels were near-normal with only minimal wall                thickening or plaque. Left Carotid: The extracranial vessels were near-normal with only minimal wall               thickening or plaque. Vertebrals:  Bilateral vertebral arteries demonstrate antegrade flow. Subclavians: Normal flow hemodynamics were seen in bilateral subclavian              arteries. *See table(s) above for measurements and observations.  Electronically signed by Harold Barban MD on 01/12/2022 at 8:51:58 PM.     Final    CT ANGIO HEAD NECK W WO CM (CODE STROKE)  Result Date: 01/13/2022 CLINICAL DATA:  TIA EXAM: CT ANGIOGRAPHY HEAD AND NECK TECHNIQUE: Multidetector CT imaging of the head and neck was performed using the standard protocol during bolus administration of intravenous contrast. Multiplanar CT image reconstructions and MIPs were obtained to evaluate the vascular anatomy. Carotid stenosis measurements (when applicable) are obtained utilizing NASCET criteria, using the distal internal carotid diameter as the denominator. RADIATION DOSE REDUCTION: This exam was performed according to the departmental dose-optimization program which includes automated exposure control, adjustment of the mA and/or kV according to patient size and/or use of iterative reconstruction technique. CONTRAST:  65mL OMNIPAQUE IOHEXOL 350 MG/ML SOLN COMPARISON:  None. FINDINGS: CTA NECK Aortic arch: Great vessel origins are patent. Right carotid system: Patent.  No stenosis. Left carotid system: Patent.  No stenosis. Vertebral arteries: Patent.  Left vertebral is dominant. Skeleton: Mild cervical spine degenerative changes primarily at C5-C6. Other neck: Unremarkable. Upper chest: No apical lung mass.  Review of the MIP images confirms the above findings CTA HEAD Anterior circulation: Intracranial internal carotid arteries are patent. Anterior and middle cerebral arteries are patent. Posterior circulation: Intracranial vertebral arteries are patent. Basilar artery is patent. Major cerebellar artery origins are patent. Posterior cerebral arteries are patent. Venous sinuses: Patent as allowed by contrast bolus timing. Review of the MIP images confirms the above findings IMPRESSION: No large vessel occlusion, hemodynamically significant stenosis, or evidence of dissection. Electronically Signed   By: Macy Mis M.D.   On: 01/13/2022 15:56     Assessment and Plan:   Near syncope -This is in setting of TIA like symptoms.  Her near syncope  is less than few seconds without actual loss of consciousness.  Echocardiogram with normal LV function.  No structural or valvular abnormality.   -Telemetry showing sinus rhythm with PACs.  Brief few episode of sinus tachycardia at 120 bpm.  Patient denies having any palpitation.  No evidence of arrhythmia or atrial fibrillation. -I do not think her current symptoms requiring monitor placement.  However, if she has another episode would consider.  2.  TIA-like symptoms -Neurology did not felt her symptoms related to TIA -Per primary team  3.  Hypertension -Started losartan this admission -Recommended repeat BMP in few days  Will sign off.  Follow-up with cardiology/Dr. Irish Lack as needed.   Risk Assessment/Risk Scores:    For questions or updates, please contact [place] Please consult [email or web address] for contact info under    Jarrett Soho, PA  01/14/2022 2:05 PM   I have examined the patient and reviewed assessment and plan and discussed with patient.  Agree with above as stated.    Patient with TIA sx affecting speech and right arm.  Now back to normal We spoke about risk factor modification including BP control, healthy diet , avoiding processed foods, whole food, plant based diet. She does not want to take medicines.  I told her BP med was mandatory. LDL 124.  COuld consider calcium scoring test to see if statin indicated.  Not much atherosclerosis noted on carotid DOppler.    No other cardiac testing at this time given normal echo.  If she has more sx, could consider rhythm monitor.  I doubt arrhythmia as a cause.  SHe has no palpitations. Increase exercise to target of 150 minutes/week.  She does not want to take medicines, so I encouraged her to improve lifestyle.   Larae Grooms

## 2022-01-17 ENCOUNTER — Telehealth: Payer: Self-pay

## 2022-01-17 NOTE — Telephone Encounter (Signed)
Eligibility checks that pharmacies run will sometimes not pick-up [employer]. For these plans, patients must present their insurance cards to the pharmacy. If the pharmacy is charging >$100 for lisinopril and atorvastatin it is likely they do not have her insurance on file.   I reviewed the formulary for [employer] and both lisinopril and atorvastatin are tier 1 generics meaning the copays should much much less than $100.   Patient should return to pharmacy and present her insurance card. If she has already done this, let me know and I will resend her rxs to our pharmacy.

## 2022-01-17 NOTE — Telephone Encounter (Signed)
Transition Care Management Follow-up Telephone Call Date of discharge and from where: 01/14/2022, [facility]  How have you been since you were released from the hospital? She stated that she is feeling good.  Any questions or concerns? Yes - medications are too expensive.   Items Reviewed: Did the pt receive and understand the discharge instructions provided? Yes  Medications obtained and verified? No - she only has the vitamin D.  She said that the atorvastatin and lisinopril were each over $100 despite having insurance. Will check with clinical Pharmacist to inquire if there are other options for medications.  Other? No  Any new allergies since your discharge? No  Dietary orders reviewed? Yes Do you have support at home? Yes   Home Care and Equipment/Supplies: Were home health services ordered? no If so, what is the name of the agency? N/a  Has the agency set up a time to come to the patient's home? not applicable Were any new equipment or medical supplies ordered?  No What is the name of the medical supply agency? N/a Were you able to get the supplies/equipment? not applicable Do you have any questions related to the use of the equipment or supplies? No  Functional Questionnaire: (I = Independent and D = Dependent) ADLs: independent   Follow up appointments reviewed:  PCP Hospital f/u appt confirmed? Yes  Scheduled to see Catherine Phi Clung, PA on 01/27/2022.  Specialist Hospital f/u appt confirmed?  None scheduled at this time   Are transportation arrangements needed? No  If their condition worsens, is the pt aware to call PCP or go to the Emergency Dept.? Yes Was the patient provided with contact information for the PCP's office or ED? Yes Was to pt encouraged to call back with questions or concerns? Yes

## 2022-01-17 NOTE — Discharge Summary (Signed)
Physician Discharge Summary   Patient: Catherine Patrick MRN: [id number] DOB: 09-29-1966  Admit date:     01/11/2022  Discharge date: 01/14/2022  Discharge Physician: Kathlen Mody   PCP: Marcine Matar, MD   Recommendations at discharge:  Follow up with PCP in one week.  Please check cbc and BMP in one week.   Discharge Diagnoses: Principal Problem:   TIA (transient ischemic attack) Active Problems:   Hypertension   Class 2 obesity   Frequency of urination   Right sided weakness    Hospital Course: Catherine Patrick is a 56 y.o. female with medical history significant of DJD, headaches, history of blood transfusion, urolithiasis, hypertension who is coming to the emergency department with complaints of right-sided weakness and particularly the right hand numbness as associated with brief motor aphasia, mild nausea, photophobia and frontal headache.  Assessment and Plan: * TIA (transient ischemic attack)- (present on admission)/ Near syncope.  Differential include psychogenic weakness.  So far work up is negative.  Neurology consulted and recommendations given. Not a TIA as per neurology.  LDL elevated at 124. Started her on statin.  Hemoglobin A1c is 5.1% Cardiology consulted and no need for monitor placement. If she has another episode, please follow upw ith cardiology as needed.    Hypertension- (present on admission)  Started on losartan.         Consultants: cardiology Neurology.  Procedures performed: none  Disposition: Home Diet recommendation:  Discharge Diet Orders (From admission, onward)     Start     Ordered   01/14/22 0000  Diet - low sodium heart healthy        01/14/22 1542           Cardiac diet  DISCHARGE MEDICATION: Allergies as of 01/14/2022       Reactions   Amlodipine Palpitations, Other (See Comments)   WITH FIRST DOSES BEGAN AFTER EACH DOSE- RESOLVED WHEN D/C'd    Pork-derived Products         Medication List     STOP taking  these medications    acetaminophen 650 MG CR tablet Commonly known as: Tylenol 8 Hour   cetirizine 10 MG tablet Commonly known as: ZYRTEC   fluticasone 50 MCG/ACT nasal spray Commonly known as: FLONASE   ibuprofen 800 MG tablet Commonly known as: ADVIL   meclizine 12.5 MG tablet Commonly known as: ANTIVERT       TAKE these medications    atorvastatin 40 MG tablet Commonly known as: LIPITOR Take 1 tablet (40 mg total) by mouth daily.   lisinopril 10 MG tablet Commonly known as: ZESTRIL Take 1 tablet (10 mg total) by mouth daily.   Vitamin D-3 25 MCG (1000 UT) Caps Take 1,000 Units by mouth daily.         Discharge Exam: Filed Weights   01/11/22 2316  Weight: 77.1 kg   General exam: Appears calm and comfortable  Respiratory system: Clear to auscultation. Respiratory effort normal. Cardiovascular system: S1 & S2 heard, RRR. No JVD, murmurs, rubs, gallops or clicks. No pedal edema. Gastrointestinal system: Abdomen is nondistended, soft and nontender. No organomegaly or masses felt. Normal bowel sounds heard. Central nervous system: Alert and oriented. No focal neurological deficits. Extremities: Symmetric 5 x 5 power. Skin: No rashes, lesions or ulcers Psychiatry: Judgement and insight appear normal. Mood & affect appropriate.    Condition at discharge: good  The results of significant diagnostics from this hospitalization (including imaging, microbiology, ancillary and laboratory) are listed below  for reference.   Imaging Studies: CT HEAD WO CONTRAST  Result Date: 01/12/2022 CLINICAL DATA:  Neuro deficit, acute, stroke suspected. Bilateral arm numbness. Headache. EXAM: CT HEAD WITHOUT CONTRAST TECHNIQUE: Contiguous axial images were obtained from the base of the skull through the vertex without intravenous contrast. RADIATION DOSE REDUCTION: This exam was performed according to the departmental dose-optimization program which includes automated exposure  control, adjustment of the mA and/or kV according to patient size and/or use of iterative reconstruction technique. COMPARISON:  None. FINDINGS: Brain: Normal anatomic configuration. No abnormal intra or extra-axial mass lesion or fluid collection. No abnormal mass effect or midline shift. No evidence of acute intracranial hemorrhage or infarct. Ventricular size is normal. Cerebellum unremarkable. Vascular: Unremarkable Skull: Intact Sinuses/Orbits: Paranasal sinuses are clear. Orbits are unremarkable. Other: Mastoid air cells and middle ear cavities are clear. IMPRESSION: Normal examination.  No acute intracranial abnormality. Electronically Signed   By: Helyn Numbers M.D.   On: 01/12/2022 00:42   MR BRAIN WO CONTRAST  Result Date: 01/12/2022 CLINICAL DATA:  TIA, right arm weakness EXAM: MRI HEAD WITHOUT CONTRAST TECHNIQUE: Multiplanar, multiecho pulse sequences of the brain and surrounding structures were obtained without intravenous contrast. COMPARISON:  None. FINDINGS: Brain: There is no acute infarction or intracranial hemorrhage. There is no intracranial mass, mass effect, or edema. There is no hydrocephalus or extra-axial fluid collection. Ventricles and sulci are normal in size and configuration. Patchy foci of T2 hyperintensity in the supratentorial white matter are nonspecific but probably reflect mild chronic microvascular ischemic changes. Vascular: Major vessel flow voids at the skull base are preserved. Skull and upper cervical spine: Normal marrow signal is preserved. Sinuses/Orbits: Paranasal sinuses are aerated. Orbits are unremarkable. Other: Sella is unremarkable.  Mastoid air cells are clear. IMPRESSION: No acute infarction, hemorrhage, or mass. Mild chronic microvascular ischemic changes. Electronically Signed   By: Guadlupe Spanish M.D.   On: 01/12/2022 13:42   ECHOCARDIOGRAM COMPLETE  Result Date: 01/13/2022    ECHOCARDIOGRAM REPORT   Patient Name:   Catherine Patrick Date of Exam: 01/13/2022  Medical Rec #:  [id number]    Height:       57.0 in Accession #:    [id number]   Weight:       170.0 lb Date of Birth:  1966-06-17     BSA:          1.679 m Patient Age:    55 years     BP:           130/98 mmHg Patient Gender: F            HR:           91 bpm. Exam Location:  Inpatient Procedure: 2D Echo Indications:    TIA  History:        Patient has no prior history of Echocardiogram examinations.                 Risk Factors:Hypertension.  Sonographer:    Devonne Doughty Referring Phys: [id number] DAVID MANUEL ORTIZ IMPRESSIONS  1. Left ventricular ejection fraction, by estimation, is 60 to 65%. The left ventricle has normal function. The left ventricle has no regional wall motion abnormalities. Left ventricular diastolic parameters were normal.  2. Right ventricular systolic function is normal. The right ventricular size is normal. There is normal pulmonary artery systolic pressure.  3. The mitral valve is normal in structure. Trivial mitral valve regurgitation. No evidence of mitral stenosis.  4. The aortic valve is  tricuspid. There is mild calcification of the aortic valve. Aortic valve regurgitation is not visualized. No aortic stenosis is present.  5. The inferior vena cava is normal in size with greater than 50% respiratory variability, suggesting right atrial pressure of 3 mmHg. Comparison(s): No prior Echocardiogram. Conclusion(s)/Recommendation(s): Normal biventricular function without evidence of hemodynamically significant valvular heart disease. No intracardiac source of embolism detected on this transthoracic study. Consider a transesophageal echocardiogram to exclude cardiac source of embolism if clinically indicated. FINDINGS  Left Ventricle: Left ventricular ejection fraction, by estimation, is 60 to 65%. The left ventricle has normal function. The left ventricle has no regional wall motion abnormalities. The left ventricular internal cavity size was normal in size. There is  no left ventricular  hypertrophy. Left ventricular diastolic parameters were normal. Right Ventricle: The right ventricular size is normal. No increase in right ventricular wall thickness. Right ventricular systolic function is normal. There is normal pulmonary artery systolic pressure. The tricuspid regurgitant velocity is 2.51 m/s, and  with an assumed right atrial pressure of 3 mmHg, the estimated right ventricular systolic pressure is 28.2 mmHg. Left Atrium: Left atrial size was normal in size. Right Atrium: Right atrial size was normal in size. Pericardium: There is no evidence of pericardial effusion. Mitral Valve: The mitral valve is normal in structure. Trivial mitral valve regurgitation. No evidence of mitral valve stenosis. Tricuspid Valve: The tricuspid valve is normal in structure. Tricuspid valve regurgitation is mild . No evidence of tricuspid stenosis. Aortic Valve: The aortic valve is tricuspid. There is mild calcification of the aortic valve. Aortic valve regurgitation is not visualized. No aortic stenosis is present. Aortic valve mean gradient measures 5.0 mmHg. Aortic valve peak gradient measures 9.5 mmHg. Aortic valve area, by VTI measures 1.88 cm. Pulmonic Valve: The pulmonic valve was grossly normal. Pulmonic valve regurgitation is mild. No evidence of pulmonic stenosis. Aorta: The aortic root, ascending aorta, aortic arch and descending aorta are all structurally normal, with no evidence of dilitation or obstruction. Venous: The inferior vena cava is normal in size with greater than 50% respiratory variability, suggesting right atrial pressure of 3 mmHg. IAS/Shunts: The atrial septum is grossly normal.  LEFT VENTRICLE PLAX 2D LVIDd:         3.60 cm   Diastology LVIDs:         2.40 cm   LV e' medial:    7.83 cm/s LV PW:         0.70 cm   LV E/e' medial:  8.8 LV IVS:        0.70 cm   LV e' lateral:   12.70 cm/s LVOT diam:     2.00 cm   LV E/e' lateral: 5.4 LV SV:         58 LV SV Index:   34 LVOT Area:     3.14 cm   RIGHT VENTRICLE            IVC RV Basal diam:  2.60 cm    IVC diam: 1.40 cm RV Mid diam:    2.10 cm RV S prime:     8.49 cm/s TAPSE (M-mode): 1.4 cm LEFT ATRIUM             Index        RIGHT ATRIUM          Index LA diam:        2.80 cm 1.67 cm/m   RA Area:     9.61 cm LA Vol (A2C):  36.2 ml 21.57 ml/m  RA Volume:   17.70 ml 10.54 ml/m LA Vol (A4C):   28.6 ml 17.04 ml/m LA Biplane Vol: 33.5 ml 19.96 ml/m  AORTIC VALVE AV Area (Vmax):    1.96 cm AV Area (Vmean):   1.85 cm AV Area (VTI):     1.88 cm AV Vmax:           154.00 cm/s AV Vmean:          103.000 cm/s AV VTI:            0.307 m AV Peak Grad:      9.5 mmHg AV Mean Grad:      5.0 mmHg LVOT Vmax:         96.00 cm/s LVOT Vmean:        60.700 cm/s LVOT VTI:          0.184 m LVOT/AV VTI ratio: 0.60  AORTA Ao Root diam: 2.60 cm Ao Asc diam:  2.90 cm MITRAL VALVE               TRICUSPID VALVE MV Area (PHT): 3.42 cm    TR Peak grad:   25.2 mmHg MV Decel Time: 222 msec    TR Vmax:        251.00 cm/s MV E velocity: 69.00 cm/s MV A velocity: 53.60 cm/s  SHUNTS MV E/A ratio:  1.29        Systemic VTI:  0.18 m                            Systemic Diam: 2.00 cm Jodelle Red MD Electronically signed by Jodelle Red MD Signature Date/Time: 01/13/2022/11:52:21 AM    Final    VAS US CAROTID (at [facility] and [facility] only)  Result Date: 01/12/2022 Carotid Arterial Duplex Study Patient Name:  Catherine Patrick  Date of Exam:   01/12/2022 Medical Rec #: [id number]     Accession #:    [id number] Date of Birth: 10/15/1966      Patient Gender: F Patient Age:   33 years Exam Location:  [facility] Procedure:      VAS US CAROTID Referring Phys: DAVID ORTIZ --------------------------------------------------------------------------------  Indications:       TIA. Risk Factors:      Hypertension. Comparison Study:  No prior study Performing Technologist: Gertie Fey MHA, RDMS, RVT, RDCS  Examination Guidelines: A complete evaluation includes B-mode imaging,  spectral Doppler, color Doppler, and power Doppler as needed of all accessible portions of each vessel. Bilateral testing is considered an integral part of a complete examination. Limited examinations for reoccurring indications may be performed as noted.  Right Carotid Findings: +----------+--------+--------+--------+------------------+------------------+             PSV cm/s EDV cm/s Stenosis Plaque Description Comments            +----------+--------+--------+--------+------------------+------------------+  CCA Prox   66       21                                                       +----------+--------+--------+--------+------------------+------------------+  CCA Distal 65       27  intimal thickening  +----------+--------+--------+--------+------------------+------------------+  ICA Prox   49       23                                                       +----------+--------+--------+--------+------------------+------------------+  ICA Distal 62       30                                                       +----------+--------+--------+--------+------------------+------------------+  ECA        54       10                                                       +----------+--------+--------+--------+------------------+------------------+ +----------+--------+-------+----------------+-------------------+             PSV cm/s EDV cms Describe         Arm Pressure (mmHG)  +----------+--------+-------+----------------+-------------------+  Subclavian 104              Multiphasic, WNL                      +----------+--------+-------+----------------+-------------------+ +---------+--------+--+--------+--+---------+  Vertebral PSV cm/s 51 EDV cm/s 24 Antegrade  +---------+--------+--+--------+--+---------+  Left Carotid Findings: +----------+--------+--------+--------+----------------------+--------+             PSV cm/s EDV cm/s Stenosis Plaque Description     Comments   +----------+--------+--------+--------+----------------------+--------+  CCA Prox   220      63                                       tortuous  +----------+--------+--------+--------+----------------------+--------+  CCA Distal 91       30                                                 +----------+--------+--------+--------+----------------------+--------+  ICA Prox   73       26                                                 +----------+--------+--------+--------+----------------------+--------+  ICA Distal 76       40                                                 +----------+--------+--------+--------+----------------------+--------+  ECA        55       14                smooth and hyperechoic           +----------+--------+--------+--------+----------------------+--------+ +----------+--------+--------+----------------+-------------------+  PSV cm/s EDV cm/s Describe         Arm Pressure (mmHG)  +----------+--------+--------+----------------+-------------------+  Subclavian 83                Multiphasic, WNL                      +----------+--------+--------+----------------+-------------------+ +---------+--------+--+--------+--+---------+  Vertebral PSV cm/s 77 EDV cm/s 37 Antegrade  +---------+--------+--+--------+--+---------+   Summary: Right Carotid: The extracranial vessels were near-normal with only minimal wall                thickening or plaque. Left Carotid: The extracranial vessels were near-normal with only minimal wall               thickening or plaque. Vertebrals:  Bilateral vertebral arteries demonstrate antegrade flow. Subclavians: Normal flow hemodynamics were seen in bilateral subclavian              arteries. *See table(s) above for measurements and observations.  Electronically signed by Coral Else MD on 01/12/2022 at 8:51:58 PM.    Final    CT ANGIO HEAD NECK W WO CM (CODE STROKE)  Result Date: 01/13/2022 CLINICAL DATA:  TIA EXAM: CT ANGIOGRAPHY HEAD AND NECK TECHNIQUE:  Multidetector CT imaging of the head and neck was performed using the standard protocol during bolus administration of intravenous contrast. Multiplanar CT image reconstructions and MIPs were obtained to evaluate the vascular anatomy. Carotid stenosis measurements (when applicable) are obtained utilizing NASCET criteria, using the distal internal carotid diameter as the denominator. RADIATION DOSE REDUCTION: This exam was performed according to the departmental dose-optimization program which includes automated exposure control, adjustment of the mA and/or kV according to patient size and/or use of iterative reconstruction technique. CONTRAST:  85mL OMNIPAQUE IOHEXOL 350 MG/ML SOLN COMPARISON:  None. FINDINGS: CTA NECK Aortic arch: Great vessel origins are patent. Right carotid system: Patent.  No stenosis. Left carotid system: Patent.  No stenosis. Vertebral arteries: Patent.  Left vertebral is dominant. Skeleton: Mild cervical spine degenerative changes primarily at C5-C6. Other neck: Unremarkable. Upper chest: No apical lung mass. Review of the MIP images confirms the above findings CTA HEAD Anterior circulation: Intracranial internal carotid arteries are patent. Anterior and middle cerebral arteries are patent. Posterior circulation: Intracranial vertebral arteries are patent. Basilar artery is patent. Major cerebellar artery origins are patent. Posterior cerebral arteries are patent. Venous sinuses: Patent as allowed by contrast bolus timing. Review of the MIP images confirms the above findings IMPRESSION: No large vessel occlusion, hemodynamically significant stenosis, or evidence of dissection. Electronically Signed   By: Guadlupe Spanish M.D.   On: 01/13/2022 15:56    Microbiology: Results for orders placed or performed during the hospital encounter of 01/11/22  Resp Panel by RT-PCR (Flu A&B, Covid) Nasopharyngeal Swab     Status: None   Collection Time: 01/11/22 12:09 AM   Specimen: Nasopharyngeal Swab;  Nasopharyngeal(NP) swabs in vial transport medium  Result Value Ref Range Status   SARS Coronavirus 2 by RT PCR NEGATIVE NEGATIVE Final    Comment: (NOTE) SARS-CoV-2 target nucleic acids are NOT DETECTED.  The SARS-CoV-2 RNA is generally detectable in upper respiratory specimens during the acute phase of infection. The lowest concentration of SARS-CoV-2 viral copies this assay can detect is 138 copies/mL. A negative result does not preclude SARS-Cov-2 infection and should not be used as the sole basis for treatment or other patient management decisions. A negative result may occur with  improper specimen collection/handling, submission of  specimen other than nasopharyngeal swab, presence of viral mutation(s) within the areas targeted by this assay, and inadequate number of viral copies(<138 copies/mL). A negative result must be combined with clinical observations, patient history, and epidemiological information. The expected result is Negative.  Fact Sheet for Patients:  BloggerCourse.com  Fact Sheet for Healthcare Providers:  SeriousBroker.it  This test is no t yet approved or cleared by the [place] FDA and  has been authorized for detection and/or diagnosis of SARS-CoV-2 by FDA under an Emergency Use Authorization (EUA). This EUA will remain  in effect (meaning this test can be used) for the duration of the COVID-19 declaration under Section 564(b)(1) of the Act, 21 U.S.C.section 360bbb-3(b)(1), unless the authorization is terminated  or revoked sooner.       Influenza A by PCR NEGATIVE NEGATIVE Final   Influenza B by PCR NEGATIVE NEGATIVE Final    Comment: (NOTE) The Xpert Xpress SARS-CoV-2/FLU/RSV plus assay is intended as an aid in the diagnosis of influenza from Nasopharyngeal swab specimens and should not be used as a sole basis for treatment. Nasal washings and aspirates are unacceptable for Xpert Xpress  SARS-CoV-2/FLU/RSV testing.  Fact Sheet for Patients: BloggerCourse.com  Fact Sheet for Healthcare Providers: SeriousBroker.it  This test is not yet approved or cleared by the [place] FDA and has been authorized for detection and/or diagnosis of SARS-CoV-2 by FDA under an Emergency Use Authorization (EUA). This EUA will remain in effect (meaning this test can be used) for the duration of the COVID-19 declaration under Section 564(b)(1) of the Act, 21 U.S.C. section 360bbb-3(b)(1), unless the authorization is terminated or revoked.  Performed at [facility], [address], [place], [place] [address]     Labs: CBC: Recent Labs  Lab 01/12/22 0002  WBC 8.8  NEUTROABS 4.0  HGB 14.2  HCT 44.2  MCV 91.7  PLT 233   Basic Metabolic Panel: Recent Labs  Lab 01/12/22 0002  NA 139  K 3.9  CL 106  CO2 26  GLUCOSE 83  BUN 17  CREATININE 0.47  CALCIUM 9.4   Liver Function Tests: Recent Labs  Lab 01/12/22 0002  AST 25  ALT 19  ALKPHOS 104  BILITOT 0.5  PROT 7.5  ALBUMIN 3.8   CBG: No results for input(s): GLUCAP in the last 168 hours.  Discharge time spent: 40 minutes.   Signed: Kathlen Mody, MD [facility] 01/17/2022

## 2022-01-18 NOTE — Telephone Encounter (Signed)
Call placed to patient and informed her per our clinical pharmacist:   Eligibility checks that pharmacies run will sometimes not pick-up [employer] health plans. For these plans, patients must present their insurance cards to the pharmacy. If the pharmacy is charging >$100 for lisinopril and atorvastatin it is likely they do not have her insurance on file.    I reviewed the formulary for [employer] and both lisinopril and atorvastatin are tier 1 generics meaning the copays should much much less than $100.    Patient should return to pharmacy and present her insurance card.     The patient said that she understood and is able to take her insurance card to her pharmacy.  Instructed her to call this CM back if she  presents the insurance card to the pharmacy and the cost of the medication remains so high that she is not able to afford her medications.

## 2022-01-26 NOTE — Progress Notes (Signed)
Patient ID: Catherine Patrick, female   DOB: [birth year], 56 y.o.   MRN: [id number]   Catherine Patrick, is a 56 y.o. female  [id number]  [id number]  DOB - 11-23-1966  Chief Complaint  Patient presents with   Urinary Frequency       Subjective:   Catherine Patrick is a 56 y.o. female here today for a follow up visit   After hospitalization 2/7-2/09/2022 for TIA.  She was getting into her car when she was unable to raise her R hand to crank the ignition and then tried to call someone and couldn't speak clearly.  She had brief R sided weakness and trouble with speech.  Work-up did not show infarct/MI.    She was having slow urinary stream that improved after taking pyridium for a couple of days.  She is still having this somewhat.    She was prescribed lisinopril and atorvastatin and has not started either.    Denies CP/palpitations/SOB.  No dizziness.  No recurrence of s/sx.    Her daughter is also with her today  From discharge summary: Discharge Diagnoses: Principal Problem:   TIA (transient ischemic attack) Active Problems:   Hypertension   Class 2 obesity   Frequency of urination   Right sided weakness       Hospital Course: Catherine Patrick is a 56 y.o. female with medical history significant of DJD, headaches, history of blood transfusion, urolithiasis, hypertension who is coming to the emergency department with complaints of right-sided weakness and particularly the right hand numbness as associated with brief motor aphasia, mild nausea, photophobia and frontal headache.   Assessment and Plan: * TIA (transient ischemic attack)- (present on admission)/ Near syncope.  Differential include psychogenic weakness.  So far work up is negative.  Neurology consulted and recommendations given. Not a TIA as per neurology.  LDL elevated at 124. Started her on statin.  Hemoglobin A1c is 5.1% Cardiology consulted and no need for monitor placement. If she has another episode, please follow upw  ith cardiology as needed.      Hypertension- (present on admission)  prescribed lisinopril  Pertinent imaging results-chronic mild microvascular ischemic changes.   . Patient has No headache, No chest pain, No abdominal pain - No Nausea, No new weakness tingling or numbness, No Cough - SOB.  No problems updated.  ALLERGIES: Allergies  Allergen Reactions   Amlodipine Palpitations and Other (See Comments)    WITH FIRST DOSES BEGAN AFTER EACH DOSE- RESOLVED WHEN D/C'd    Pork-Derived Products     PAST MEDICAL HISTORY: Past Medical History:  Diagnosis Date   DJD (degenerative joint disease)    Headache    History of blood transfusion    2 times after child birth   History of kidney stones    passed one in 2014- 2017   HTN (hypertension) 11/10/2016   Hypertension    Kidney stones     MEDICATIONS AT HOME: Prior to Admission medications   Medication Sig Start Date End Date Taking? Authorizing Provider  fluconazole (DIFLUCAN) 150 MG tablet Take 1 tablet (150 mg total) by mouth once for 1 dose. 01/27/22 01/27/22 Yes Han Vejar, Dionne Bucy, PA-C  nitrofurantoin, macrocrystal-monohydrate, (MACROBID) 100 MG capsule Take 1 capsule (100 mg total) by mouth 2 (two) times daily. 01/27/22  Yes Freeman Caldron M, PA-C  atorvastatin (LIPITOR) 40 MG tablet Take 1 tablet (40 mg total) by mouth daily. Patient not taking: Reported on 01/27/2022 01/15/22   Hosie Poisson, MD  Cholecalciferol (VITAMIN D-3)  25 MCG (1000 UT) CAPS Take 1,000 Units by mouth daily. Patient not taking: Reported on 01/27/2022    [provider]  lisinopril (ZESTRIL) 10 MG tablet Take 1 tablet (10 mg total) by mouth daily. Patient not taking: Reported on 01/27/2022 01/15/22   Hosie Poisson, MD    ROS: Neg HEENT Neg resp Neg cardiac Neg GI Neg GU-hesitancy and slow stream Neg MS Neg psych Neg neuro  Objective:   Vitals:   01/27/22 1120  BP: 125/88  Pulse: 78  Resp: 20  SpO2: 99%  Weight: 166 lb (75.3 kg)   Height: 4\' 9"  (1.448 m)   Exam General appearance : Awake, alert, not in any distress. Speech Clear. Not toxic looking HEENT: Atraumatic and Normocephalic Neck: Supple, no JVD. No cervical lymphadenopathy.  Chest: Good air entry bilaterally, CTAB.  No rales/rhonchi/wheezing CVS: S1 S2 regular, no murmurs.  Extremities: B/L Lower Ext shows no edema, both legs are warm to touch Neurology: Awake alert, and oriented X 3, CN II-XII intact, Non focal Skin: No Rash  Data Review Lab Results  Component Value Date   HGBA1C 5.1 01/12/2022   HGBA1C 5.4 06/11/2015   HGBA1C 5.8 (H) 01/21/2013    Assessment & Plan   1. TIA (transient ischemic attack) Reviewed all imaging-CT head w/ and w/o contrast, MRI head, carotid doppler, echo (EF60-65%),  EKG with mild PAC.  No ST changes.  Only finding is minor/mild calcification of minor vasculature in the head and minimal in carotids.  I reviewed each test individually and discussed at length with daughter and patient and answered all questions-spent 45 mins face to face.    2. Frequency of urination See #5 - POCT URINALYSIS DIP (CLINITEK)  3. Colon cancer screening - Ambulatory referral to Gastroenterology  4. Encounter for screening mammogram for malignant neoplasm of breast - MM DIGITAL SCREENING BILATERAL; Future  5. Urinary tract infection without hematuria, site unspecified Drink 80-100 ounces water daily - nitrofurantoin, macrocrystal-monohydrate, (MACROBID) 100 MG capsule; Take 1 capsule (100 mg total) by mouth 2 (two) times daily.  Dispense: 10 capsule; Refill: 0 - fluconazole (DIFLUCAN) 150 MG tablet; Take 1 tablet (150 mg total) by mouth once for 1 dose.  Dispense: 1 tablet; Refill: 0 - Urine Culture  6. Elevated blood pressure reading Hold lisinopril for now.  Work on [employer] and exercise/weight loss. -some BP normotensive and a few elevated.  She says usu normal when she is not stressed out.    7. Hospital discharge  follow-up Doing well  8.  Elevated LDL-start atorvastatin as prescribed and recheck at f/up appt    Patient have been counseled extensively about nutrition and exercise. Other issues discussed during this visit include: low cholesterol diet, weight control and daily exercise, foot care, annual eye examinations at Ophthalmology, importance of adherence with medications and regular follow-up. We also discussed long term complications of uncontrolled diabetes and hypertension.   Return in about 3 months (around 04/26/2022) for Dr Johnson-chronic conditions and recheck cholesterol.  The patient was given clear instructions to go to ER or return to medical center if symptoms don't improve, worsen or new problems develop. The patient verbalized understanding. The patient was told to call to get lab results if they haven't heard anything in the next week.      Freeman Caldron, PA-C [facility] and [facility] [place], [place]   01/27/2022, 12:14 PM

## 2022-01-27 ENCOUNTER — Other Ambulatory Visit: Payer: Self-pay

## 2022-01-27 ENCOUNTER — Ambulatory Visit: Payer: 59 | Attending: Physician Assistant | Admitting: Physician Assistant

## 2022-01-27 ENCOUNTER — Encounter: Payer: Self-pay | Admitting: Physician Assistant

## 2022-01-27 VITALS — BP 125/88 | HR 78 | Resp 20 | Ht <= 58 in | Wt 166.0 lb

## 2022-01-27 DIAGNOSIS — G459 Transient cerebral ischemic attack, unspecified: Secondary | ICD-10-CM | POA: Diagnosis not present

## 2022-01-27 DIAGNOSIS — R350 Frequency of micturition: Secondary | ICD-10-CM

## 2022-01-27 DIAGNOSIS — Z1211 Encounter for screening for malignant neoplasm of colon: Secondary | ICD-10-CM

## 2022-01-27 DIAGNOSIS — Z1231 Encounter for screening mammogram for malignant neoplasm of breast: Secondary | ICD-10-CM | POA: Diagnosis not present

## 2022-01-27 DIAGNOSIS — I10 Essential (primary) hypertension: Secondary | ICD-10-CM

## 2022-01-27 DIAGNOSIS — N390 Urinary tract infection, site not specified: Secondary | ICD-10-CM

## 2022-01-27 DIAGNOSIS — E7800 Pure hypercholesterolemia, unspecified: Secondary | ICD-10-CM

## 2022-01-27 DIAGNOSIS — Z09 Encounter for follow-up examination after completed treatment for conditions other than malignant neoplasm: Secondary | ICD-10-CM

## 2022-01-27 DIAGNOSIS — R030 Elevated blood-pressure reading, without diagnosis of hypertension: Secondary | ICD-10-CM

## 2022-01-27 LAB — POCT URINALYSIS DIP (CLINITEK)
Bilirubin, UA: NEGATIVE
Glucose, UA: NEGATIVE mg/dL
Nitrite, UA: NEGATIVE
POC PROTEIN,UA: NEGATIVE
Spec Grav, UA: >=1.03 — AB (ref 1.010–1.025)
Urobilinogen, UA: 0.2 E.U./dL
pH, UA: 5.5 (ref 5.0–8.0)

## 2022-01-27 MED ORDER — FLUCONAZOLE 150 MG PO TABS: 150.0000 mg | ORAL_TABLET | Freq: Once | ORAL | 0 refills | Status: AC

## 2022-01-27 MED ORDER — NITROFURANTOIN MONOHYD MACRO 100 MG PO CAPS: 100.0000 mg | ORAL_CAPSULE | Freq: Two times a day (BID) | ORAL | 0 refills | Status: DC

## 2022-01-27 NOTE — Patient Instructions (Signed)
Drink 80-100 ounces water daily   Urinary Tract Infection, Adult A urinary tract infection (UTI) is an infection of any part of the urinary tract. The urinary tract includes: The kidneys. The ureters. The bladder. The urethra. These organs make, store, and get rid of pee (urine) in the body. What are the causes? This infection is caused by germs (bacteria) in your genital area. These germs grow and cause swelling (inflammation) of your urinary tract. What increases the risk? The following factors may make you more likely to develop this condition: Using a small, thin tube (catheter) to drain pee. Not being able to control when you pee or poop (incontinence). Being female. If you are female, these things can increase the risk: Using these methods to prevent pregnancy: A medicine that kills sperm (spermicide). A device that blocks sperm (diaphragm). Having low levels of a female hormone (estrogen). Being pregnant. You are more likely to develop this condition if: You have genes that add to your risk. You are sexually active. You take antibiotic medicines. You have trouble peeing because of: A prostate that is bigger than normal, if you are female. A blockage in the part of your body that drains pee from the bladder. A kidney stone. A nerve condition that affects your bladder. Not getting enough to drink. Not peeing often enough. You have other conditions, such as: Diabetes. A weak disease-fighting system (immune system). Sickle cell disease. Gout. Injury of the spine. What are the signs or symptoms? Symptoms of this condition include: Needing to pee right away. Peeing small amounts often. Pain or burning when peeing. Blood in the pee. Pee that smells bad or not like normal. Trouble peeing. Pee that is cloudy. Fluid coming from the vagina, if you are female. Pain in the belly or lower back. Other symptoms include: Vomiting. Not feeling hungry. Feeling mixed up  (confused). This may be the first symptom in older adults. Being tired and grouchy (irritable). A fever. Watery poop (diarrhea). How is this treated? Taking antibiotic medicine. Taking other medicines. Drinking enough water. In some cases, you may need to see a specialist. Follow these instructions at home: Medicines Take over-the-counter and prescription medicines only as told by your doctor. If you were prescribed an antibiotic medicine, take it as told by your doctor. Do not stop taking it even if you start to feel better. General instructions Make sure you: Pee until your bladder is empty. Do not hold pee for a long time. Empty your bladder after sex. Wipe from front to back after peeing or pooping if you are a female. Use each tissue one time when you wipe. Drink enough fluid to keep your pee pale yellow. Keep all follow-up visits. Contact a doctor if: You do not get better after 1-2 days. Your symptoms go away and then come back. Get help right away if: You have very bad back pain. You have very bad pain in your lower belly. You have a fever. You have chills. You feeling like you will vomit or you vomit. Summary A urinary tract infection (UTI) is an infection of any part of the urinary tract. This condition is caused by germs in your genital area. There are many risk factors for a UTI. Treatment includes antibiotic medicines. Drink enough fluid to keep your pee pale yellow. This information is not intended to replace advice given to you by your health care provider. Make sure you discuss any questions you have with your health care provider. Document Revised: 07/03/2020 Document  Reviewed: 07/03/2020 Elsevier Patient Education  2022 [employer].

## 2022-01-30 LAB — URINE CULTURE

## 2022-02-02 ENCOUNTER — Other Ambulatory Visit: Payer: Self-pay | Admitting: Physician Assistant

## 2022-02-02 DIAGNOSIS — N390 Urinary tract infection, site not specified: Secondary | ICD-10-CM

## 2022-02-02 MED ORDER — CEPHALEXIN 500 MG PO CAPS: 500.0000 mg | ORAL_CAPSULE | Freq: Three times a day (TID) | ORAL | 0 refills | Status: DC

## 2022-02-02 MED ORDER — FLUCONAZOLE 150 MG PO TABS: 150.0000 mg | ORAL_TABLET | Freq: Once | ORAL | 0 refills | Status: AC

## 2022-04-22 ENCOUNTER — Ambulatory Visit: Payer: 59 | Admitting: Orthopaedic Surgery

## 2022-04-29 ENCOUNTER — Encounter: Payer: Self-pay | Admitting: Orthopaedic Surgery

## 2022-04-29 ENCOUNTER — Ambulatory Visit (INDEPENDENT_AMBULATORY_CARE_PROVIDER_SITE_OTHER): Payer: 59 | Admitting: Orthopaedic Surgery

## 2022-04-29 ENCOUNTER — Ambulatory Visit (INDEPENDENT_AMBULATORY_CARE_PROVIDER_SITE_OTHER): Payer: 59

## 2022-04-29 DIAGNOSIS — M25572 Pain in left ankle and joints of left foot: Secondary | ICD-10-CM

## 2022-04-29 NOTE — Progress Notes (Unsigned)
Office Visit Note   Patient: Catherine Patrick           Date of Birth: [birth year]           MRN: [id number] Visit Date: 04/29/2022              Requested by: Ladell Pier, MD [address] [place],  [place] [address] PCP: Ladell Pier, MD   Assessment & Plan: Visit Diagnoses:  1. Pain in left ankle and joints of left foot     Plan: Impression is left ankle pes planus posterior tibial peroneal tendinitis.  The patient has not had good relief from orthotics or the use of a cam walker.  I would like to refer to Dr. Sharol Given for further evaluation treatment recommendation.  She will make an appoint with him on her way out today.  Call with concerns or questions.  Follow-Up Instructions: Return for with Dr. Sharol Given for further eval and treatment recommendation.   Orders:  Orders Placed This Encounter  Procedures   XR Ankle Complete Left   No orders of the defined types were placed in this encounter.     Procedures: No procedures performed   Clinical Data: No additional findings.   Subjective: Chief Complaint  Patient presents with   Left Ankle - Pain    HPI patient is a pleasant 56 year old female who comes in today with chronic left ankle pain.  She has been dealing with this for years.  She denies any injury or change in activity.  The pain she has is to the entire ankle and is worse with walking as well as going from a seated or lying position to a standing position.  She has been taking ibuprofen which does seem to help.  She has been seen by [place] in the past for this where she has been placed in a cam walker.  This seems to temporarily help but she does not seem to tolerate wearing this.  She has also tried orthotics from the shoe market which have not helped.  Review of Systems as detailed in HPI.  All others reviewed and are negative.   Objective: Vital Signs: LMP 03/19/2014   Physical Exam well-developed well-nourished female no acute distress.  Alert  and oriented x3.  Ortho Exam left ankle exam shows mild swelling.  She does have flexible pes planus.  Moderate tenderness along the posterior tibial and peroneal tendons.  She has painless dorsiflexion and plantarflexion of the ankle.  She does have slight pain with eversion especially with resistance.  She is neurovascular intact distally.  Specialty Comments:  No specialty comments available.  Imaging: No results found.   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   Hypertension 11/10/2016   Cervical radiculopathy 09/19/2016   Left arm pain 09/19/2016   Left lateral ankle pain 09/30/2015   Plantar fasciitis of left foot 12/18/2013   Metatarsal deformity 12/18/2013   Bilateral pronation deformity of feet 12/18/2013   Nephrolithiasis 05/30/2013   Past Medical History:  Diagnosis Date   DJD (degenerative joint disease)    Headache    History of blood transfusion    2 times after child birth   History of kidney stones  passed one in 2014- 2017   HTN (hypertension) 11/10/2016   Hypertension    Kidney stones     Family History  Problem Relation Age of Onset   Diabetes Father    Kidney disease Father    Diabetes Brother    Cancer Neg Hx    Heart failure Neg Hx    Hyperlipidemia Neg Hx    Hypertension Neg Hx     Past Surgical History:  Procedure Laterality Date   CESAREAN SECTION     CESAREAN SECTION     HERNIA REPAIR     LIPOSUCTION  2002   TOTAL KNEE ARTHROPLASTY Right 11/30/2016   Procedure: RIGHT TOTAL KNEE ARTHROPLASTY;  Surgeon: Leandrew Koyanagi, MD;  Location: [facility];  Service: Orthopedics;  Laterality: Right;   TOTAL KNEE ARTHROPLASTY Left 03/16/2017   Procedure: LEFT TOTAL KNEE ARTHROPLASTY;   Surgeon: Leandrew Koyanagi, MD;  Location: [facility];  Service: Orthopedics;  Laterality: Left;   Social History   Occupational History   Not on file  Tobacco Use   Smoking status: Never   Smokeless tobacco: Never  Substance and Sexual Activity   Alcohol use: No   Drug use: No   Sexual activity: Yes    Birth control/protection: None

## 2022-05-10 ENCOUNTER — Ambulatory Visit (INDEPENDENT_AMBULATORY_CARE_PROVIDER_SITE_OTHER): Payer: 59 | Admitting: Orthopedic Surgery

## 2022-05-10 DIAGNOSIS — M25572 Pain in left ankle and joints of left foot: Secondary | ICD-10-CM

## 2022-05-24 ENCOUNTER — Ambulatory Visit (INDEPENDENT_AMBULATORY_CARE_PROVIDER_SITE_OTHER): Payer: 59 | Admitting: Orthopedic Surgery

## 2022-05-24 ENCOUNTER — Encounter: Payer: Self-pay | Admitting: Orthopedic Surgery

## 2022-05-24 DIAGNOSIS — M76822 Posterior tibial tendinitis, left leg: Secondary | ICD-10-CM

## 2022-05-24 NOTE — Progress Notes (Signed)
Office Visit Note   Patient: Catherine Patrick           Date of Birth: 1966/09/04           MRN: [id number] Visit Date: 05/24/2022              Requested by: Marcine Matar, MD [address] [place] [address] [place],  [place] [address] PCP: Marcine Matar, MD  Chief Complaint  Patient presents with   Left Ankle - Follow-up      HPI:  Patient is a 56 year old woman who is seen in referral from Dr.Xu.  Patient has chronic posterior tibial tendon insufficiency she has failed conservative treatment with using both a fracture boot and orthotics.  Assessment & Plan: Visit Diagnoses:  1. Posterior tibial tendon dysfunction, left     Plan: Discussed with the patient the surgical treatment option for talonavicular and subtalar fusion.  Discussed risks and benefits of surgery including the importance of being nonweightbearing for 2 months and essentially 3 months for total recovery.  Patient states she understands will like to consider her options.  Follow-Up Instructions: Return if symptoms worsen or fail to improve.   Ortho Exam  Patient is alert, oriented, no adenopathy, well-dressed, normal affect, normal respiratory effort. Examination patient has chronic posterior tibial tendon insufficiency she has pronation and valgus of the forefoot she has impingement pain over the sinus Tarsi she has swelling of the peroneal tendons that are tender to palpation she has posterior tibial tendon insufficiency and the posterior tibial tendon is tender to palpation she cannot do a single limb heel raise.  She has a good dorsalis pedis pulse.  Imaging: No results found. No images are attached to the encounter.  Labs: Lab Results  Component Value Date   HGBA1C 5.1 01/12/2022   HGBA1C 5.4 06/11/2015   HGBA1C 5.8 (H) 01/21/2013   ESRSEDRATE 15 03/14/2017   ESRSEDRATE 38 (H) 12/11/2016   ESRSEDRATE 17 11/22/2016   CRP <0.80 03/14/2017   CRP 8.6 (H) 12/11/2016   CRP 1.1 (H) 11/22/2016    REPTSTATUS 10/19/2007 FINAL 10/17/2007   CULT  10/17/2007    Multiple bacterial morphotypes present, none predominant. Suggest appropriate recollection if clinically indicated.     Lab Results  Component Value Date   ALBUMIN 3.8 01/12/2022   ALBUMIN 4.1 03/31/2020   ALBUMIN 3.9 03/14/2017    No results found for: "MG" Lab Results  Component Value Date   VD25OH 17 (L) 04/01/2015    No results found for: "PREALBUMIN"    Latest Ref Rng & Units 01/12/2022   12:02 AM 03/31/2020   12:12 PM 01/28/2019   10:44 AM  CBC EXTENDED  WBC 4.0 - 10.5 K/uL 8.8  4.5  9.1   RBC 3.87 - 5.11 MIL/uL 4.82  4.97  4.66   Hemoglobin 12.0 - 15.0 g/dL 19.1  47.8  29.5   HCT 36.0 - 46.0 % 44.2  46.2  41.5   Platelets 150 - 400 K/uL 233  166  222   NEUT# 1.7 - 7.7 K/uL 4.0  2.3  4.3   Lymph# 0.7 - 4.0 K/uL 3.9  1.4  3.9      There is no height or weight on file to calculate BMI.  Orders:  No orders of the defined types were placed in this encounter.  No orders of the defined types were placed in this encounter.    Procedures: No procedures performed  Clinical Data: No additional findings.  ROS:  All other systems negative, except as noted in the HPI. Review of Systems  Objective: Vital Signs: LMP 11/18/2013   Specialty Comments:  No specialty comments available.  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   Hypertension 11/10/2016   Cervical radiculopathy 09/19/2016   Left arm pain 09/19/2016   Left lateral ankle pain 09/30/2015   Plantar fasciitis of left foot 12/18/2013   Metatarsal deformity 12/18/2013   Bilateral pronation deformity of feet 12/18/2013   Nephrolithiasis 05/30/2013   Past Medical History:   Diagnosis Date   DJD (degenerative joint disease)    Headache    History of blood transfusion    2 times after child birth   History of kidney stones    passed one in 2014- 2017   HTN (hypertension) 11/10/2016   Hypertension    Kidney stones     Family History  Problem Relation Age of Onset   Diabetes Father    Kidney disease Father    Diabetes Brother    Cancer Neg Hx    Heart failure Neg Hx    Hyperlipidemia Neg Hx    Hypertension Neg Hx     Past Surgical History:  Procedure Laterality Date   CESAREAN SECTION     CESAREAN SECTION     HERNIA REPAIR     LIPOSUCTION  2002   TOTAL KNEE ARTHROPLASTY Right 11/30/2016   Procedure: RIGHT TOTAL KNEE ARTHROPLASTY;  Surgeon: Tarry Kos, MD;  Location: [facility];  Service: Orthopedics;  Laterality: Right;   TOTAL KNEE ARTHROPLASTY Left 03/16/2017   Procedure: LEFT TOTAL KNEE ARTHROPLASTY;  Surgeon: Tarry Kos, MD;  Location: [facility];  Service: Orthopedics;  Laterality: Left;   Social History   Occupational History   Not on file  Tobacco Use   Smoking status: Never   Smokeless tobacco: Never  Substance and Sexual Activity   Alcohol use: No   Drug use: No   Sexual activity: Yes    Birth control/protection: None

## 2022-05-28 ENCOUNTER — Encounter: Payer: Self-pay | Admitting: Orthopedic Surgery

## 2022-11-04 ENCOUNTER — Other Ambulatory Visit: Payer: Self-pay

## 2022-11-04 ENCOUNTER — Encounter (HOSPITAL_COMMUNITY): Payer: Self-pay

## 2022-11-04 ENCOUNTER — Emergency Department (HOSPITAL_COMMUNITY)
Admission: EM | Admit: 2022-11-04 | Discharge: 2022-11-04 | Disposition: A | Discharge status: Home / Self Care | Payer: Commercial Managed Care - HMO | Attending: Emergency Medicine | Admitting: Emergency Medicine

## 2022-11-04 DIAGNOSIS — J020 Streptococcal pharyngitis: Secondary | ICD-10-CM | POA: Diagnosis not present

## 2022-11-04 DIAGNOSIS — Z20822 Contact with and (suspected) exposure to covid-19: Secondary | ICD-10-CM | POA: Diagnosis not present

## 2022-11-04 DIAGNOSIS — R509 Fever, unspecified: Secondary | ICD-10-CM | POA: Diagnosis present

## 2022-11-04 LAB — RESP PANEL BY RT-PCR (FLU A&B, COVID) ARPGX2
Influenza A by PCR: NEGATIVE
Influenza B by PCR: NEGATIVE
SARS Coronavirus 2 by RT PCR: NEGATIVE

## 2022-11-04 LAB — GROUP A STREP BY PCR: Group A Strep by PCR: DETECTED — AB

## 2022-11-04 MED ORDER — AMOXICILLIN 500 MG PO CAPS: 500.0000 mg | ORAL_CAPSULE | Freq: Three times a day (TID) | ORAL | 0 refills | Status: DC

## 2022-11-04 MED ORDER — ACETAMINOPHEN 325 MG PO TABS
650.0000 mg | ORAL_TABLET | Freq: Once | ORAL | Status: AC
  Administered 2022-11-04: 650 mg via ORAL
  Filled 2022-11-04: qty 2

## 2022-11-04 NOTE — ED Triage Notes (Signed)
Patient c/o nasal congestion, fever, headache, sore throat, left ear pain x 2 days.

## 2022-11-04 NOTE — Discharge Instructions (Addendum)
Take antibiotics as prescribed and complete the full course. Motrin and Tylenol as needed as directed for fevers and pain. Return to ER for worsening or concerning symptoms.

## 2022-11-04 NOTE — ED Notes (Signed)
I made nurse aware of patient temp

## 2022-11-04 NOTE — ED Provider Notes (Signed)
[facility]-EMERGENCY DEPT Provider Note   CSN: [id number] Arrival date & time: 11/04/22  1008     History  Chief Complaint  Patient presents with   Otalgia   Headache   Sore Throat   Nasal Congestion   Fever    Catherine Patrick is a 56 y.o. female.  56 yo female with complaint of fever, headache, cough, sore throat, left ear pain. 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        Home Medications Prior to Admission medications   Medication Sig Start Date End Date Taking? Authorizing Provider  amoxicillin (AMOXIL) 500 MG capsule Take 1 capsule (500 mg total) by mouth 3 (three) times daily. 11/04/22  Yes Jeannie Fend, PA-C  atorvastatin (LIPITOR) 40 MG tablet Take 1 tablet (40 mg total) by mouth daily. Patient not taking: Reported on 01/27/2022 01/15/22   Kathlen Mody, MD  Cholecalciferol (VITAMIN D-3) 25 MCG (1000 UT) CAPS Take 1,000 Units by mouth daily. Patient not taking: Reported on 01/27/2022    [provider]  lisinopril (ZESTRIL) 10 MG tablet Take 1 tablet (10 mg total) by mouth daily. Patient not taking: Reported on 01/27/2022 01/15/22   Kathlen Mody, MD      Allergies    Amlodipine and Pork-derived products    Review of Systems   Review of Systems Negative except as per HPI Physical Exam Updated Vital Signs BP (!) 147/97 (BP Location: Right Arm)   Pulse 100   Temp 99.1 F (37.3 C) (Oral)   Resp 18   Ht 4\' 9"  (1.448 m)   Wt 75.8 kg   LMP 11/18/2013   SpO2 97%   BMI 36.14 kg/m  Physical Exam Vitals and nursing note reviewed.  Constitutional:      General: She is not in acute distress.    Appearance: She is well-developed. She is not diaphoretic.  HENT:     Head: Normocephalic and atraumatic.     Right Ear: Tympanic membrane and ear canal normal.     Left Ear: Tympanic membrane and ear canal normal.     Mouth/Throat:     Mouth: Mucous membranes are moist.     Pharynx: Uvula midline.  Posterior oropharyngeal erythema present. No pharyngeal swelling.     Tonsils: No tonsillar exudate or tonsillar abscesses. 1+ on the right. 1+ on the left.  Cardiovascular:     Rate and Rhythm: Normal rate and regular rhythm.     Heart sounds: Normal heart sounds.  Pulmonary:     Effort: Pulmonary effort is normal.     Breath sounds: Normal breath sounds.  Musculoskeletal:     Cervical back: Neck supple.  Lymphadenopathy:     Cervical: Cervical adenopathy present.  Skin:    General: Skin is warm and dry.  Neurological:     Mental Status: She is alert and oriented to person, place, and time.  Psychiatric:        Behavior: Behavior normal.     ED Results / Procedures / Treatments   Labs (all labs ordered are listed, but only abnormal results are displayed) Labs Reviewed  GROUP A STREP BY PCR - Abnormal; Notable for the following components:      Result Value   Group A Strep by PCR DETECTED (*)    All other components within normal limits  RESP PANEL BY RT-PCR (FLU A&B, COVID) [id number]    EKG None  Radiology No results found.  Procedures  Procedures    Medications Ordered in ED Medications  acetaminophen (TYLENOL) tablet 650 mg (650 mg Oral Given 11/04/22 1145)    ED Course/ Medical Decision Making/ A&P                           Medical Decision Making Risk OTC drugs.   56 year old female with fever, sore throat, URI symptoms as above, onset 2 hours ago. Works with preschoolers. On exam, has oropharyngeal and tonsillar erythema without swelling or exudate. Notable tender cervical Lns. Febrile on arrival, improves after antipyretics in triage.  Found to be negative for COVID/flu, + strep. Will treat with ABX with return to ER precautions.         Final Clinical Impression(s) / ED Diagnoses Final diagnoses:  Strep throat    Rx / DC Orders ED Discharge Orders          Ordered    amoxicillin (AMOXIL) 500 MG capsule  3 times daily        11/04/22 1253               Jeannie Fend, PA-C 11/04/22 1255    Derwood Kaplan, MD 11/04/22 1423

## 2023-02-21 DIAGNOSIS — H5213 Myopia, bilateral: Secondary | ICD-10-CM | POA: Diagnosis not present

## 2023-04-18 DIAGNOSIS — Z139 Encounter for screening, unspecified: Secondary | ICD-10-CM

## 2023-04-18 NOTE — Congregational Nurse Program (Signed)
  Dept: [phone]   Congregational Nurse Program Note  Date of Encounter: 04/18/2023  Past Medical History: Past Medical History:  Diagnosis Date   DJD (degenerative joint disease)    Headache    History of blood transfusion    2 times after child birth   History of kidney stones    passed one in 2014- 2017   HTN (hypertension) 11/10/2016   Hypertension    Kidney stones     Encounter Details:  CNP Questionnaire - 04/18/23 1724       Questionnaire   Ask client: Do you give verbal consent for me to treat you today? Yes    Student Assistance N/A    Location Patient Presenter, broadcasting    Visit Setting with Client Organization    Patient Status Unknown    Engineer, building services or [place] Insurance    Insurance/Financial Assistance Referral N/A    Medication N/A    Medical Provider Yes    Screening Referrals Made Annual Wellness Visit    Medical Referrals Made N/A    Medical Appointment Made N/A    Recently w/o PCP, now 1st time PCP visit completed due to CNs referral or appointment made N/A    Food N/A    Transportation N/A    Housing/Utilities N/A    Interpersonal Safety N/A    Interventions Counsel;Advocate/Support;Educate    Abnormal to Normal Screening Since Last CN Visit N/A    Screenings CN Performed Blood Pressure;Blood Glucose;Weight    Sent Client to Lab for: N/A    Did client attend any of the following based off CNs referral or appointments made? N/A    ED Visit Averted N/A    Life-Saving Intervention Made N/A             Here for screenings, BP, glucose and weight.  Encouraged to f/u with pcp on elevated BP     04/18/2023    5:26 PM 11/04/2022    1:08 PM 11/04/2022   11:08 AM  Vitals with BMI  Height   4\' 9"   Weight 175 lbs  167 lbs  BMI   36.13  Systolic 149 144   Diastolic 100 89   Pulse  93

## 2023-11-07 ENCOUNTER — Ambulatory Visit: Payer: BLUE CROSS/BLUE SHIELD | Admitting: Internal Medicine

## 2024-01-07 ENCOUNTER — Ambulatory Visit (INDEPENDENT_AMBULATORY_CARE_PROVIDER_SITE_OTHER): Payer: No Typology Code available for payment source

## 2024-01-07 ENCOUNTER — Encounter (HOSPITAL_COMMUNITY): Payer: Self-pay | Admitting: Emergency Medicine

## 2024-01-07 ENCOUNTER — Ambulatory Visit (HOSPITAL_COMMUNITY)
Admission: EM | Admit: 2024-01-07 | Discharge: 2024-01-07 | Disposition: A | Discharge status: Home / Self Care | Payer: No Typology Code available for payment source | Attending: Emergency Medicine | Admitting: Emergency Medicine

## 2024-01-07 DIAGNOSIS — R051 Acute cough: Secondary | ICD-10-CM

## 2024-01-07 DIAGNOSIS — J189 Pneumonia, unspecified organism: Secondary | ICD-10-CM | POA: Diagnosis not present

## 2024-01-07 MED ORDER — AMOXICILLIN-POT CLAVULANATE 875-125 MG PO TABS: 1.0000 | ORAL_TABLET | Freq: Two times a day (BID) | ORAL | 0 refills | Status: DC

## 2024-01-07 MED ORDER — BENZONATATE 100 MG PO CAPS: 100.0000 mg | ORAL_CAPSULE | Freq: Three times a day (TID) | ORAL | 0 refills | Status: DC

## 2024-01-07 MED ORDER — AZITHROMYCIN 250 MG PO TABS: 250.0000 mg | ORAL_TABLET | Freq: Every day | ORAL | 0 refills | Status: DC

## 2024-01-07 NOTE — ED Provider Notes (Signed)
[facility]    CSN: [id number] Arrival date & time: 01/07/24  1501      History   Chief Complaint Chief Complaint  Patient presents with   Cough   Fever    HPI Catherine Patrick is a 58 y.o. female.   Patient reports that on Tuesday, approximately 6 days ago she was standing up to make tea when she had a an episode of dizziness.  She continued to make the tea and did not have any more dizziness afterwards.  The same day she developed fevers, cough, congestion, generalized bodyaches and headaches.  She has been taking Tylenol and Aleve, reports her last fever was yesterday.  Cough continues and is worse at night.  Endorses recent sick contact of sister who was recently diagnosed with pneumonia.  The history is provided by the patient and medical records.  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   Hypertension 11/10/2016   Cervical radiculopathy 09/19/2016   Left arm pain 09/19/2016   Left lateral ankle pain 09/30/2015   Plantar fasciitis of left foot 12/18/2013   Metatarsal deformity 12/18/2013   Bilateral pronation deformity of feet 12/18/2013   Nephrolithiasis 05/30/2013    Past Surgical History:  Procedure Laterality Date   CESAREAN SECTION     CESAREAN SECTION     HERNIA REPAIR     LIPOSUCTION  2002   TOTAL KNEE ARTHROPLASTY Right 11/30/2016   Procedure: RIGHT TOTAL KNEE ARTHROPLASTY;  Surgeon: Tarry Kos, MD;   Location: [facility];  Service: Orthopedics;  Laterality: Right;   TOTAL KNEE ARTHROPLASTY Left 03/16/2017   Procedure: LEFT TOTAL KNEE ARTHROPLASTY;  Surgeon: Tarry Kos, MD;  Location: [facility];  Service: Orthopedics;  Laterality: Left;    OB History     Gravida      Para      Term      Preterm      AB      Living  3      SAB      IAB      Ectopic      Multiple      Live Births               Home Medications    Prior to Admission medications   Medication Sig Start Date End Date Taking? Authorizing Provider  amoxicillin-clavulanate (AUGMENTIN) 875-125 MG tablet Take 1 tablet by mouth every 12 (twelve) hours. 01/07/24  Yes Rinaldo Ratel, [place] N, FNP  azithromycin (ZITHROMAX) 250 MG tablet Take 1 tablet (250 mg total) by mouth daily. Take first 2 tablets together, then 1 every day until finished. 01/07/24  Yes Rinaldo Ratel, [place] N, FNP  benzonatate (TESSALON) 100 MG capsule Take 1 capsule (100 mg total) by mouth every 8 (eight) hours. 01/07/24  Yes Rinaldo Ratel, [place] N, FNP  atorvastatin (LIPITOR) 40 MG tablet Take  1 tablet (40 mg total) by mouth daily. Patient not taking: Reported on 01/27/2022 01/15/22   Kathlen Mody, MD  Cholecalciferol (VITAMIN D-3) 25 MCG (1000 UT) CAPS Take 1,000 Units by mouth daily. Patient not taking: Reported on 01/27/2022    [provider]  lisinopril (ZESTRIL) 10 MG tablet Take 1 tablet (10 mg total) by mouth daily. Patient not taking: Reported on 01/27/2022 01/15/22   Kathlen Mody, MD    Family History Family History  Problem Relation Age of Onset   Diabetes Father    Kidney disease Father    Diabetes Brother    Cancer Neg Hx    Heart failure Neg Hx    Hyperlipidemia Neg Hx    Hypertension Neg Hx     Social History Social History   Tobacco Use   Smoking status: Never   Smokeless tobacco: Never  Vaping Use   Vaping status: Never Used  Substance Use Topics   Alcohol use: No   Drug use: No     Allergies   Amlodipine and  Pork-derived products   Review of Systems Review of Systems  Per HPI   Physical Exam Triage Vital Signs ED Triage Vitals  Encounter Vitals Group     BP 01/07/24 1545 126/85     Systolic BP Percentile --      Diastolic BP Percentile --      Pulse Rate 01/07/24 1545 76     Resp 01/07/24 1545 17     Temp 01/07/24 1545 98.1 F (36.7 C)     Temp Source 01/07/24 1545 Oral     SpO2 01/07/24 1545 100 %     Weight 01/07/24 1548 164 lb 6.4 oz (74.6 kg)     Height --      Head Circumference --      Peak Flow --      Pain Score 01/07/24 1544 2     Pain Loc --      Pain Education --      Exclude from Growth Chart --    No data found.  Updated Vital Signs BP 126/85 (BP Location: Right Arm)   Pulse 76   Temp 98.1 F (36.7 C) (Oral)   Resp 17   Wt 164 lb 6.4 oz (74.6 kg)   LMP 11/18/2013   SpO2 100%   BMI 35.58 kg/m   Visual Acuity Right Eye Distance:   Left Eye Distance:   Bilateral Distance:    Right Eye Near:   Left Eye Near:    Bilateral Near:     Physical Exam Vitals and nursing note reviewed.  Constitutional:      Appearance: Normal appearance.  HENT:     Head: Normocephalic and atraumatic.     Right Ear: External ear normal.     Left Ear: External ear normal.     Nose: Nose normal.     Mouth/Throat:     Mouth: Mucous membranes are moist.     Pharynx: Posterior oropharyngeal erythema present.  Cardiovascular:     Rate and Rhythm: Normal rate and regular rhythm.     Heart sounds: Normal heart sounds. No murmur heard. Pulmonary:     Effort: Pulmonary effort is normal. No respiratory distress.     Breath sounds: Normal breath sounds.  Skin:    General: Skin is warm and dry.  Neurological:     General: No focal deficit present.     Mental Status: She is alert and oriented to person, place,  and time.  Psychiatric:        Mood and Affect: Mood normal.        Behavior: Behavior normal.      UC Treatments / Results  Labs (all labs ordered are  listed, but only abnormal results are displayed) Labs Reviewed - No data to display  EKG   Radiology DG Chest 2 View Result Date: 01/07/2024 CLINICAL DATA:  Cough and fever EXAM: CHEST - 2 VIEW COMPARISON:  09/19/2017 FINDINGS: Possible streaky airspace opacity at the right lung base. No pleural effusion. Normal cardiac size. No pneumothorax IMPRESSION: Possible streaky airspace opacity at the right lung base, atelectasis versus pneumonia. Electronically Signed   By: Jasmine Pang M.D.   On: 01/07/2024 16:23    Procedures Procedures (including critical care time)  Medications Ordered in UC Medications - No data to display  Initial Impression / Assessment and Plan / UC Course  I have reviewed the triage vital signs and the nursing notes.  Pertinent labs & imaging results that were available during my care of the patient were reviewed by me and considered in my medical decision making (see chart for details).  Vitals and triage reviewed, patient is hemodynamically stable.  Lungs are vesicular, heart with regular rate and rhythm.  Symptoms have been ongoing and persistent for the past 6 days with fever, chest x-ray suspicious for right lower lobe pneumonia.  Treated with dual antibiotic therapy, cough management reviewed.  Plan of care, follow-up care return precautions given, no questions at this time.     Final Clinical Impressions(s) / UC Diagnoses   Final diagnoses:  Acute cough  Community acquired pneumonia of right lower lobe of lung     Discharge Instructions      Your x-ray was suspicious for pneumonia, I am treating you with 2 different antibiotics.  Take these as prescribed and with food to help prevent gastrointestinal upset.  You can use the cough medicine as needed.  For any fever you can alternate between 500 mg of Tylenol and 800 mg of ibuprofen every 4-6 hours.  Your symptoms should improve with antibiotics, if no improvement or any changes return to clinic or  follow-up with your primary care provider.     ED Prescriptions     Medication Sig Dispense Auth. Provider   azithromycin (ZITHROMAX) 250 MG tablet Take 1 tablet (250 mg total) by mouth daily. Take first 2 tablets together, then 1 every day until finished. 6 tablet Rinaldo Ratel, [place] N, FNP   amoxicillin-clavulanate (AUGMENTIN) 875-125 MG tablet Take 1 tablet by mouth every 12 (twelve) hours. 14 tablet Rinaldo Ratel, [place] N, [place]   benzonatate (TESSALON) 100 MG capsule Take 1 capsule (100 mg total) by mouth every 8 (eight) hours. 21 capsule Keyandre Pileggi, [place] N, [place]      PDMP not reviewed this encounter.   Zayne Marovich, [place] N, [place] 01/07/24 [phone]

## 2024-01-07 NOTE — ED Triage Notes (Signed)
Pt had fevers, cough, congestion and body aches, for 3 days. Took tylenol and Aleve.

## 2024-01-07 NOTE — Discharge Instructions (Signed)
Your x-ray was suspicious for pneumonia, I am treating you with 2 different antibiotics.  Take these as prescribed and with food to help prevent gastrointestinal upset.  You can use the cough medicine as needed.  For any fever you can alternate between 500 mg of Tylenol and 800 mg of ibuprofen every 4-6 hours.  Your symptoms should improve with antibiotics, if no improvement or any changes return to clinic or follow-up with your primary care provider.

## 2024-05-02 ENCOUNTER — Ambulatory Visit

## 2024-05-06 ENCOUNTER — Ambulatory Visit: Attending: Internal Medicine

## 2024-05-06 DIAGNOSIS — Z23 Encounter for immunization: Secondary | ICD-10-CM

## 2024-05-08 ENCOUNTER — Ambulatory Visit: Attending: Internal Medicine

## 2024-05-08 DIAGNOSIS — Z111 Encounter for screening for respiratory tuberculosis: Secondary | ICD-10-CM | POA: Diagnosis not present

## 2024-05-08 DIAGNOSIS — R7611 Nonspecific reaction to tuberculin skin test without active tuberculosis: Secondary | ICD-10-CM

## 2024-05-08 NOTE — Progress Notes (Signed)
PPD Reading Note  PPD read and results entered in EpicCare.  Result: 0 mm induration.  Interpretation: neg

## 2024-05-24 ENCOUNTER — Telehealth: Payer: Self-pay | Admitting: Internal Medicine

## 2024-05-24 ENCOUNTER — Ambulatory Visit: Attending: Internal Medicine | Admitting: Internal Medicine

## 2024-05-24 VITALS — BP 122/78 | HR 77 | Temp 98.2°F | Ht <= 58 in | Wt 164.0 lb

## 2024-05-24 DIAGNOSIS — Z8673 Personal history of transient ischemic attack (TIA), and cerebral infarction without residual deficits: Secondary | ICD-10-CM | POA: Diagnosis not present

## 2024-05-24 DIAGNOSIS — Z0289 Encounter for other administrative examinations: Secondary | ICD-10-CM | POA: Diagnosis not present

## 2024-05-24 DIAGNOSIS — Z2821 Immunization not carried out because of patient refusal: Secondary | ICD-10-CM

## 2024-05-24 DIAGNOSIS — E785 Hyperlipidemia, unspecified: Secondary | ICD-10-CM | POA: Diagnosis not present

## 2024-05-24 DIAGNOSIS — Z5320 Procedure and treatment not carried out because of patient's decision for unspecified reasons: Secondary | ICD-10-CM

## 2024-05-24 DIAGNOSIS — Z1211 Encounter for screening for malignant neoplasm of colon: Secondary | ICD-10-CM | POA: Diagnosis not present

## 2024-05-24 MED ORDER — ATORVASTATIN CALCIUM 20 MG PO TABS: 20.0000 mg | ORAL_TABLET | Freq: Every day | ORAL | 0 refills | Status: AC

## 2024-05-24 NOTE — Telephone Encounter (Signed)
 Called & spoke to the patient. Verified name & DOB. Patient has been scheduled for 07/05/2024 at 11:10 am with Dr. Lincoln Renshaw. Patient acknowledged appointment and had no further questions.

## 2024-05-24 NOTE — Progress Notes (Signed)
 Patient ID: Catherine Patrick, female    DOB: 12-Nov-1966  MRN: [id number]  CC: Annual Exam (Physical. /Requesting physical form completion. /No to pap, mammogram, colonoscopy. No shingles vax)   Subjective: Catherine Patrick is a 58 y.o. female who presents for work physical. This is my first time seeing pt. Last seen by our PA 01/2022 Her concerns today include:  Patient with previous history of TIA, HTN, obesity, urolithiasis, HL  Discussed the use of AI scribe software for clinical note transcription with the patient, who gave verbal consent to proceed.  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  She has a history of elevated BP in past, currently well-controlled without medication. She experienced a transient ischemic attack in February 2023 with temporary left hand weakness and blurred vision, but has no residual deficits. She is not on any medications except for vitamin D .  She is supposed to be on aspirin  and atorvastatin .  She took the atorvastatin  that was given to her after the TIA but did not request any refills after it ran out.  LDL cholesterol at that time was 124.  MRI of the head showed mild chronic microvascular ischemic changes.   She underwent two knee replacements and had kidney stones. She denies chest pain, shortness of breath, headaches, dizziness, joint pain, swelling, depression, or anxiety.   She is required to have TB screening for this work physical.  She had that done earlier this month through us .  TB skin test was negative.  She denies any chronic cough or fever at this time.  HM: She is hesitant about breast cancer screening due to a past negative experience that she had after LT breast biopsy in 2016.  Developed infection in the breast. She is agreeable to doing pap at a future visit.  We discussed colon cancer  screening.  She would prefer the Cologuard test.  She declines shingles vaccine.     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     No current facility-administered medications on file prior to visit.    Allergies  Allergen Reactions   Amlodipine  Palpitations and Other (See Comments)    WITH FIRST DOSES BEGAN AFTER EACH DOSE- RESOLVED WHEN D/C'd    Pork-Derived Products     Social History   Socioeconomic History   Marital status: Single    Spouse name: Not on file   Number of children: Not on file   Years of education: Not on file   Highest education level: Not on file  Occupational History   Not on file  Tobacco Use  Smoking status: Never   Smokeless tobacco: Never  Vaping Use   Vaping status: Never Used  Substance and Sexual Activity   Alcohol use: No   Drug use: No   Sexual activity: Yes    Birth control/protection: None  Other Topics Concern   Not on file  Social History Narrative   Not on file   Social Drivers of Health   Financial Resource Strain: High Risk (05/24/2024)   Overall Financial Resource Strain (CARDIA)    Difficulty of Paying Living Expenses: Very hard  Food Insecurity: No Food Insecurity (05/24/2024)   Hunger Vital Sign    Worried  About Running Out of Food in the Last Year: Never true    Ran Out of Food in the Last Year: Never true  Transportation Needs: No Transportation Needs (05/24/2024)   PRAPARE - Administrator, Civil Service (Medical): No    Lack of Transportation (Non-Medical): No  Physical Activity: Inactive (05/24/2024)   Exercise Vital Sign    Days of Exercise per Week: 0 days    Minutes of Exercise per Session: 0 min  Stress: No Stress Concern Present (05/24/2024)   [name] of Occupational Health - Occupational Stress Questionnaire    Feeling of Stress: Not at all  Social Connections: Moderately Integrated (05/24/2024)   Social Connection and Isolation Panel    Frequency of Communication with Friends and Family: More than three times a week    Frequency of Social Gatherings with Friends and Family: More than three times a week    Attends Religious Services: More than 4 times per year    Active Member of [employer] or Organizations: Yes    Attends Banker Meetings: 1 to 4 times per year    Marital Status: Divorced  Intimate Partner Violence: Not At Risk (05/24/2024)   Humiliation, Afraid, Rape, and Kick questionnaire    Fear of Current or Ex-Partner: No    Emotionally Abused: No    Physically Abused: No    Sexually Abused: No    Family History  Problem Relation Age of Onset   Diabetes Father    Kidney disease Father    Diabetes Brother    Cancer Neg Hx    Heart failure Neg Hx    Hyperlipidemia Neg Hx    Hypertension Neg Hx     Past Surgical History:  Procedure Laterality Date   CESAREAN SECTION     CESAREAN SECTION     HERNIA REPAIR     LIPOSUCTION  2002   TOTAL KNEE ARTHROPLASTY Right 11/30/2016   Procedure: RIGHT TOTAL KNEE ARTHROPLASTY;  Surgeon: Wes Hamman, MD;  Location: [facility];  Service: Orthopedics;  Laterality: Right;   TOTAL KNEE ARTHROPLASTY Left 03/16/2017   Procedure: LEFT TOTAL KNEE ARTHROPLASTY;  Surgeon: Wes Hamman, MD;  Location: [facility];   Service: Orthopedics;  Laterality: Left;    ROS: Review of Systems Negative except as stated above  PHYSICAL EXAM: BP 122/78 (BP Location: Left Arm, Patient Position: Sitting, Cuff Size: Normal)   Pulse 77   Temp 98.2 F (36.8 C) (Oral)   Ht 4' 9 (1.448 m)   Wt 164 lb (74.4 kg)   LMP 11/18/2013   SpO2 98%   BMI 35.49 kg/m   Wt Readings from Last 3 Encounters:  05/24/24 164 lb (74.4 kg)  01/07/24 164 lb 6.4 oz (74.6 kg)  04/18/23 175 lb (79.4 kg)    Physical Exam  General appearance - alert, well  appearing, and in no distress Mental status - normal mood, behavior, speech, dress, motor activity, and thought processes Eyes - pupils equal and reactive, extraocular eye movements intact Mouth - mucous membranes moist, pharynx normal without lesions Neck - supple, no significant adenopathy Chest - clear to auscultation, no wheezes, rales or rhonchi, symmetric air entry Heart - normal rate, regular rhythm, normal S1, S2, no murmurs, rubs, clicks or gallops Musculoskeletal - no joint tenderness, deformity or swelling Extremities - peripheral pulses normal, no pedal edema, no clubbing or cyanosis     05/24/2024   10:27 AM 01/27/2022   11:37 AM 11/23/2020    9:00 AM  Depression screen PHQ 2/9  Decreased Interest 0 0 0  Down, Depressed, Hopeless 0 0 0  PHQ - 2 Score 0 0 0  Altered sleeping 0 0   Tired, decreased energy 0 0   Change in appetite 0 0   Feeling bad or failure about yourself  0 0   Trouble concentrating 0 0   Moving slowly or fidgety/restless 0 0   Suicidal thoughts 0 0   PHQ-9 Score 0 0   Difficult doing work/chores Not difficult at all Not difficult at all        Latest Ref Rng & Units 01/12/2022   12:02 AM 03/31/2020   12:12 PM 01/28/2019   10:44 AM  CMP  Glucose 70 - 99 mg/dL 83  98  88   BUN 6 - 20 mg/dL 17  18  15    Creatinine 0.44 - 1.00 mg/dL 1.61  0.96  0.45   Sodium 135 - 145 mmol/L 139  138  144   Potassium 3.5 - 5.1 mmol/L 3.9  3.7  4.4    Chloride 98 - 111 mmol/L 106  103  108   CO2 22 - 32 mmol/L 26  25  18    Calcium  8.9 - 10.3 mg/dL 9.4  8.8  9.3   Total Protein 6.5 - 8.1 g/dL 7.5  7.6    Total Bilirubin 0.3 - 1.2 mg/dL 0.5  0.5    Alkaline Phos 38 - 126 U/L 104  120    AST 15 - 41 U/L 25  28    ALT 0 - 44 U/L 19  25     Lipid Panel     Component Value Date/Time   CHOL 197 01/12/2022 0809   TRIG 63 01/12/2022 0809   HDL 60 01/12/2022 0809   CHOLHDL 3.3 01/12/2022 0809   VLDL 13 01/12/2022 0809   LDLCALC 124 (H) 01/12/2022 0809    CBC    Component Value Date/Time   WBC 8.8 01/12/2022 0002   RBC 4.82 01/12/2022 0002   HGB 14.2 01/12/2022 0002   HGB 13.6 01/28/2019 1044   HCT 44.2 01/12/2022 0002   HCT 41.5 01/28/2019 1044   PLT 233 01/12/2022 0002   PLT 222 01/28/2019 1044   MCV 91.7 01/12/2022 0002   MCV 89 01/28/2019 1044   MCH 29.5 01/12/2022 0002   MCHC 32.1 01/12/2022 0002   RDW 13.4 01/12/2022 0002   RDW 13.8 01/28/2019 1044   LYMPHSABS 3.9 01/12/2022 0002   LYMPHSABS 3.9 (H) 01/28/2019 1044   MONOABS 0.6 01/12/2022 0002   EOSABS 0.1 01/12/2022 0002   EOSABS 0.1 01/28/2019 1044   BASOSABS 0.1 01/12/2022 0002   BASOSABS 0.0 01/28/2019 1044    ASSESSMENT AND PLAN: 1. Encounter for physical examination related to employment (Primary) Patient is physically and mentally capable of performing her job.  Form completed and given to her today.  2. History of transient ischemic attack (TIA) Given her history of TIA with MRI at that time showing mild chronic microvascular ischemic changes, I recommend that she takes a baby aspirin  daily and restart the atorvastatin  to help prevent future cardiovascular events.  Went over with her that statin therapy can sometimes affect the liver so we will need to check some baseline blood test today including CBC if she is going to be on aspirin  and LFTs because of the statin therapy.  She is agreeable to restarting statin therapy.  Advised her to hold off on getting  it filled until after we get the results of blood test that we are going to do today. - CBC  3. Hyperlipidemia, unspecified hyperlipidemia type See #2 above. - CBC - Comprehensive metabolic panel with GFR - Lipid panel  4. Screening for colon cancer Colon cancer screening discussed.  No family history of colon cancer.  She prefers to do the Cologuard test. - Cologuard  5. Mammogram declined Acknowledge that the experience she had in 2016 was not good.  However I encouraged her not to be deterred and getting regular mammograms for breast cancer screening.  She will think about it.  6. Herpes zoster vaccination declined   Patient was given the opportunity to ask questions.  Patient verbalized understanding of the plan and was able to repeat key elements of the plan.   This documentation was completed using Paediatric nurse.  Any transcriptional errors are unintentional.  Orders Placed This Encounter  Procedures   Cologuard   CBC   Comprehensive metabolic panel with GFR   Lipid panel     Requested Prescriptions   Signed Prescriptions Disp Refills   atorvastatin  (LIPITOR) 20 MG tablet 90 tablet 0    Sig: Take 1 tablet (20 mg total) by mouth daily.    Return in about 6 weeks (around 07/05/2024) for PAP.  Concetta Dee, MD, FACP

## 2024-05-24 NOTE — Telephone Encounter (Signed)
-----   Message from Concetta Dee sent at 05/24/2024 12:25 PM EDT ----- Regarding: Appt I saw this patient today.  I forgot to put in that she needs follow-up in 6 weeks for Pap smear.  Please schedule.

## 2024-05-24 NOTE — Patient Instructions (Signed)
 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 We reviewed your history of hypertension and a transient ischemic attack (TIA) you experienced in February 2023. You are not currently on any medications except for vitamin D . We discussed your concerns about breast cancer screening and your preferences for other screenings. You do not smoke and have no family history of colon cancer.  YOUR PLAN:  -TRANSIENT ISCHEMIC ATTACK (TIA): A TIA is a temporary period of symptoms similar to those of a stroke. It is important to manage risk factors to prevent future strokes. We recommend you start taking a daily low-dose aspirin  to help prevent blood clots. We will also order blood tests to check your cholesterol, kidney, and liver function, and may prescribe atorvastatin  based on the results.  -HYPERTENSION: Hypertension is high blood pressure. Your blood pressure is currently well-controlled without medication. It is important to keep it under control to reduce your risk of stroke.  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 We will complete the necessary forms after the examination.  -GENERAL HEALTH MAINTENANCE: You are due for a mammogram, Pap smear, and colon cancer screening. Although you are hesitant about the mammogram due to a past negative experience, you agreed to a Pap smear and a stool-based test for colon cancer screening. We will schedule the Pap smear and order a Cologuard kit for you.  INSTRUCTIONS:  Please start taking a daily low-dose aspirin  as recommended. We will follow up with you after reviewing your blood test results. Additionally, we will schedule your Pap smear and provide you with a Cologuard kit for colon cancer screening.

## 2024-05-25 ENCOUNTER — Ambulatory Visit: Payer: Self-pay | Admitting: Internal Medicine

## 2024-05-25 LAB — LIPID PANEL
Chol/HDL Ratio: 3.6 ratio (ref 0.0–4.4)
Cholesterol, Total: 196 mg/dL (ref 100–199)
HDL: 54 mg/dL (ref >39)
LDL Chol Calc (NIH): 116 mg/dL — ABNORMAL HIGH (ref 0–99)
Triglycerides: 149 mg/dL (ref 0–149)
VLDL Cholesterol Cal: 26 mg/dL (ref 5–40)

## 2024-05-25 LAB — CBC
Hematocrit: 41.9 % (ref 34.0–46.6)
Hemoglobin: 13.5 g/dL (ref 11.1–15.9)
MCH: 30.3 pg (ref 26.6–33.0)
MCHC: 32.2 g/dL (ref 31.5–35.7)
MCV: 94 fL (ref 79–97)
Platelets: 196 10*3/uL (ref 150–450)
RBC: 4.46 x10E6/uL (ref 3.77–5.28)
RDW: 13.2 % (ref 11.7–15.4)
WBC: 8 10*3/uL (ref 3.4–10.8)

## 2024-05-25 LAB — COMPREHENSIVE METABOLIC PANEL WITH GFR
ALT: 15 IU/L (ref 0–32)
AST: 19 IU/L (ref 0–40)
Albumin: 4.1 g/dL (ref 3.8–4.9)
Alkaline Phosphatase: 138 IU/L — ABNORMAL HIGH (ref 44–121)
BUN/Creatinine Ratio: 33 — ABNORMAL HIGH (ref 12–28)
BUN: 22 mg/dL (ref 8–27)
Bilirubin Total: 0.2 mg/dL (ref 0.0–1.2)
CO2: 21 mmol/L (ref 20–29)
Calcium: 9.3 mg/dL (ref 8.7–10.3)
Chloride: 104 mmol/L (ref 96–106)
Creatinine, Ser: 0.67 mg/dL (ref 0.57–1.00)
Globulin, Total: 2.6 g/dL (ref 1.5–4.5)
Glucose: 83 mg/dL (ref 70–99)
Potassium: 4.3 mmol/L (ref 3.5–5.2)
Sodium: 140 mmol/L (ref 134–144)
Total Protein: 6.7 g/dL (ref 6.0–8.5)
eGFR: 100 mL/min/{1.73_m2} (ref >59)

## 2024-06-05 LAB — COLOGUARD: COLOGUARD: POSITIVE — AB

## 2024-06-05 NOTE — Progress Notes (Signed)
 Let patient know that the Cologuard test for colon cancer screening came back positive.  We will need to refer her to a gastroenterologist for further evaluation.  She will need to have a colonoscopy for them to look inside her colon with a camera looking for any masses.  Let me know if she is agreeable to us  submitting this referral.

## 2024-06-20 ENCOUNTER — Telehealth: Payer: Self-pay | Admitting: Internal Medicine

## 2024-06-20 NOTE — Telephone Encounter (Signed)
 Copied from CRM [id number]. Topic: Referral - Status >> Jun 20, 2024 10:51 AM Gustabo D wrote:  Patient would like a call back on the status of her referral for a colonoscopy no one has called her

## 2024-06-21 NOTE — Telephone Encounter (Signed)
 Called & spoke to the patient. Verified name & DOB. Informed of referral placed at [facility] [address] [place], [place] [address] PH# [phone]. Patient expressed verbal understanding.

## 2024-06-24 ENCOUNTER — Encounter: Payer: Self-pay | Admitting: Internal Medicine

## 2024-06-24 ENCOUNTER — Telehealth: Payer: Self-pay | Admitting: Internal Medicine

## 2024-06-24 DIAGNOSIS — Z1211 Encounter for screening for malignant neoplasm of colon: Secondary | ICD-10-CM

## 2024-06-24 NOTE — Addendum Note (Signed)
 Addended by: Parthena Fergeson on: 06/24/2024 04:26 PM   Modules accepted: Orders

## 2024-06-24 NOTE — Telephone Encounter (Signed)
 Copied from CRM [id number]. Topic: Referral - Question >> Jun 24, 2024 11:33 AM Catherine Patrick wrote:  Reason for CRM: Patient states she talked to gastro and they said they did not receive the referral- please call [phone]

## 2024-06-24 NOTE — Telephone Encounter (Signed)
 The referral is a old one from two years ago, a new one is needed

## 2024-07-01 ENCOUNTER — Encounter: Payer: Self-pay | Admitting: Internal Medicine

## 2024-07-01 ENCOUNTER — Other Ambulatory Visit: Payer: Self-pay

## 2024-07-01 ENCOUNTER — Ambulatory Visit (AMBULATORY_SURGERY_CENTER)

## 2024-07-01 VITALS — Ht <= 58 in | Wt 165.0 lb

## 2024-07-01 DIAGNOSIS — Z1211 Encounter for screening for malignant neoplasm of colon: Secondary | ICD-10-CM

## 2024-07-01 DIAGNOSIS — R195 Other fecal abnormalities: Secondary | ICD-10-CM

## 2024-07-01 MED ORDER — PEG 3350-KCL-NA BICARB-NACL 420 G PO SOLR: 4000.0000 mL | Freq: Once | ORAL | 0 refills | Status: AC

## 2024-07-01 MED ORDER — NA SULFATE-K SULFATE-MG SULF 17.5-3.13-1.6 GM/177ML PO SOLN: 1.0000 | Freq: Once | ORAL | 0 refills | Status: DC

## 2024-07-01 NOTE — Progress Notes (Signed)
 Denies allergies to eggs or soy products. Denies complication of anesthesia or sedation. Denies use of weight loss medication. Denies use of O2.   Emmi instructions given for colonoscopy.

## 2024-07-04 ENCOUNTER — Telehealth: Payer: Self-pay

## 2024-07-04 ENCOUNTER — Telehealth: Payer: Self-pay | Admitting: Internal Medicine

## 2024-07-04 NOTE — Telephone Encounter (Signed)
 Copied from CRM [id number]. Topic: Appointments - Appointment Info/Confirmation >> Jul 04, 2024  2:55 PM Sophia H wrote: Patient/patient representative is calling for information regarding an appointment.     Patient wants to know if her appt for tomorrow is necessary, having a colonoscopy in about two weeks and would much rather follow up after if Dr. Vicci doesn't mind. Please advise   # [phone]

## 2024-07-04 NOTE — Telephone Encounter (Signed)
 Confirmed appt for 8/1

## 2024-07-04 NOTE — Telephone Encounter (Signed)
 Patient cancelled appointment. Ok to see patient for follow-up appointment on 07/30/2024.

## 2024-07-05 ENCOUNTER — Ambulatory Visit: Admitting: Internal Medicine

## 2024-07-18 ENCOUNTER — Encounter: Payer: Self-pay | Admitting: Internal Medicine

## 2024-07-18 ENCOUNTER — Ambulatory Visit (AMBULATORY_SURGERY_CENTER): Admitting: Internal Medicine

## 2024-07-18 VITALS — BP 152/89 | HR 84 | Temp 98.2°F | Resp 17 | Ht <= 58 in | Wt 165.0 lb

## 2024-07-18 DIAGNOSIS — D120 Benign neoplasm of cecum: Secondary | ICD-10-CM

## 2024-07-18 DIAGNOSIS — R195 Other fecal abnormalities: Secondary | ICD-10-CM

## 2024-07-18 DIAGNOSIS — Z1211 Encounter for screening for malignant neoplasm of colon: Secondary | ICD-10-CM | POA: Diagnosis present

## 2024-07-18 DIAGNOSIS — K5730 Diverticulosis of large intestine without perforation or abscess without bleeding: Secondary | ICD-10-CM | POA: Diagnosis not present

## 2024-07-18 DIAGNOSIS — D125 Benign neoplasm of sigmoid colon: Secondary | ICD-10-CM | POA: Diagnosis not present

## 2024-07-18 DIAGNOSIS — K648 Other hemorrhoids: Secondary | ICD-10-CM

## 2024-07-18 MED ORDER — SODIUM CHLORIDE 0.9 % IV SOLN: 500.0000 mL | Freq: Once | INTRAVENOUS | Status: DC

## 2024-07-18 NOTE — Op Note (Signed)
 [facility] Patient Name: Catherine Patrick Procedure Date: 07/18/2024 3:37 PM MRN: [id number] Endoscopist: Rosario Estefana Kidney , , [id number] Age: 58 Referring MD:  Date of Birth: July 09, 1966 Gender: Female Account #: [id number] Procedure:                Colonoscopy Indications:              Positive Cologuard test Medicines:                Monitored Anesthesia Care Procedure:                Pre-Anesthesia Assessment:                           - Prior to the procedure, a History and Physical                            was performed, and patient medications and                            allergies were reviewed. The patient's tolerance of                            previous anesthesia was also reviewed. The risks                            and benefits of the procedure and the sedation                            options and risks were discussed with the patient.                            All questions were answered, and informed consent                            was obtained. Prior Anticoagulants: The patient has                            taken no anticoagulant or antiplatelet agents. ASA                            Grade Assessment: II - A patient with mild systemic                            disease. After reviewing the risks and benefits,                            the patient was deemed in satisfactory condition to                            undergo the procedure.                           After obtaining informed consent, the colonoscope  was passed under direct vision. Throughout the                            procedure, the patient's blood pressure, pulse, and                            oxygen saturations were monitored continuously. The                            CF [id number] #[id number] was introduced through the anus                            and advanced to the the terminal ileum. The                            colonoscopy was performed without  difficulty. The                            patient tolerated the procedure well. The quality                            of the bowel preparation was good. The terminal                            ileum, ileocecal valve, appendiceal orifice, and                            rectum were photographed. Scope In: 3:54:40 PM Scope Out: 4:15:01 PM Scope Withdrawal Time: 0 hours 11 minutes 46 seconds  Total Procedure Duration: 0 hours 20 minutes 21 seconds  Findings:                 The terminal ileum appeared normal.                           Multiple diverticula were found in the entire colon.                           A 3 mm polyp was found in the cecum. The polyp was                            sessile. The polyp was removed with a cold snare.                            Resection and retrieval were complete.                           A 14 mm polyp was found in the sigmoid colon. The                            polyp was pedunculated. The polyp was removed with                            a  hot snare. Resection and retrieval were complete.                           Non-bleeding internal hemorrhoids were found during                            retroflexion. Complications:            No immediate complications. Estimated Blood Loss:     Estimated blood loss was minimal. Impression:               - The examined portion of the ileum was normal.                           - Diverticulosis in the entire examined colon.                           - One 3 mm polyp in the cecum, removed with a cold                            snare. Resected and retrieved.                           - One 14 mm polyp in the sigmoid colon, removed                            with a hot snare. Resected and retrieved.                           - Non-bleeding internal hemorrhoids. Recommendation:           - Discharge patient to home (with escort).                           - Await pathology results.                           - The  findings and recommendations were discussed                            with the patient. Dr Estefana Federico Rosario Estefana Federico,  07/18/2024 4:29:13 PM

## 2024-07-18 NOTE — Progress Notes (Signed)
 Called to room to assist during endoscopic procedure.  Patient ID and intended procedure confirmed with present staff. Received instructions for my participation in the procedure from the performing physician.

## 2024-07-18 NOTE — Progress Notes (Signed)
 Pt's states no medical or surgical changes since previsit or office visit.

## 2024-07-18 NOTE — Progress Notes (Signed)
 GASTROENTEROLOGY PROCEDURE H&P NOTE   Primary Care Physician: Vicci Barnie NOVAK, MD    Reason for Procedure:   Positive Cologuard  Plan:    Colonoscopy  Patient is appropriate for endoscopic procedure(s) in the ambulatory ([facility]) setting.  The nature of the procedure, as well as the risks, benefits, and alternatives were carefully and thoroughly reviewed with the patient. Ample time for discussion and questions allowed. The patient understood, was satisfied, and agreed to proceed.     HPI: Catherine Patrick is a 58 y.o. female who presents for colonoscopy for positive Cologuard. Denies blood in stools, changes in bowel habits, or unintentional weight loss. Denies family history of colon cancer.  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 Authorizing Provider  atorvastatin  (LIPITOR) 20 MG tablet Take 1 tablet (20 mg total) by mouth daily. 05/24/24   Vicci Barnie NOVAK, MD  Cholecalciferol  (VITAMIN D -3) 25 MCG (1000 UT) CAPS Take 1,000 Units by mouth daily.    [provider]    Current Outpatient Medications  Medication Sig Dispense Refill   atorvastatin  (LIPITOR) 20 MG  tablet Take 1 tablet (20 mg total) by mouth daily. 90 tablet 0   Cholecalciferol  (VITAMIN D -3) 25 MCG (1000 UT) CAPS Take 1,000 Units by mouth daily.     Current Facility-Administered Medications  Medication Dose Route Frequency Provider Last Rate Last Admin   0.9 %  sodium chloride  infusion  500 mL Intravenous Once Federico Rosario BROCKS, MD        Allergies as of 07/18/2024 - Review Complete 07/18/2024  Allergen Reaction Noted   Pork-derived products  01/12/2022    Family History  Problem Relation Age of Onset   Diabetes Father    Patrick disease Father    Diabetes Brother    Cancer Neg Hx    Heart failure Neg Hx    Hyperlipidemia Neg Hx    Hypertension Neg Hx    Colon cancer Neg Hx    Esophageal cancer Neg Hx    Rectal cancer Neg Hx    Stomach cancer Neg Hx     Social History   Socioeconomic History   Marital status: Single    Spouse name: Not on file   Number of children: Not on file   Years of education: Not on file   Highest education level: Not on file  Occupational History   Not on file  Tobacco Use   Smoking status: Never   Smokeless tobacco: Never  Vaping Use   Vaping status: Never Used  Substance and  Sexual Activity   Alcohol use: Never   Drug use: Never   Sexual activity: Yes    Birth control/protection: None, Post-menopausal  Other Topics Concern   Not on file  Social History Narrative   Not on file   Social Drivers of Health   Financial Resource Strain: High Risk (05/24/2024)   Overall Financial Resource Strain (CARDIA)    Difficulty of Paying Living Expenses: Very hard  Food Insecurity: No Food Insecurity (05/24/2024)   Hunger Vital Sign    Worried About Running Out of Food in the Last Year: Never true    Ran Out of Food in the Last Year: Never true  Transportation Needs: No Transportation Needs (05/24/2024)   PRAPARE - Administrator, Civil Service (Medical): No    Lack of Transportation (Non-Medical): No  Physical Activity: Inactive  (05/24/2024)   Exercise Vital Sign    Days of Exercise per Week: 0 days    Minutes of Exercise per Session: 0 min  Stress: No Stress Concern Present (05/24/2024)   [name] of Occupational Health - Occupational Stress Questionnaire    Feeling of Stress: Not at all  Social Connections: Moderately Integrated (05/24/2024)   Social Connection and Isolation Panel    Frequency of Communication with Friends and Family: More than three times a week    Frequency of Social Gatherings with Friends and Family: More than three times a week    Attends Religious Services: More than 4 times per year    Active Member of [employer] or Organizations: Yes    Attends Banker Meetings: 1 to 4 times per year    Marital Status: Divorced  Catering manager Violence: Not At Risk (05/24/2024)   Humiliation, Afraid, Rape, and Kick questionnaire    Fear of Current or Ex-Partner: No    Emotionally Abused: No    Physically Abused: No    Sexually Abused: No    Physical Exam: Vital signs in last 24 hours: BP (!) 146/90   Pulse 78   Temp 98.2 F (36.8 C) (Temporal)   Ht 4' 9 (1.448 m)   Wt 165 lb (74.8 kg)   LMP 11/18/2013   SpO2 99%   BMI 35.71 kg/m  GEN: NAD EYE: Sclerae anicteric ENT: MMM CV: Non-tachycardic Pulm: No increased work of breathing GI: Soft, NT/ND NEURO:  Alert & Oriented   Catherine Kidney, MD [facility]  07/18/2024 3:41 PM

## 2024-07-18 NOTE — Patient Instructions (Signed)
 Resume all of your previous medications today as ordered.  Read your discharge instructions.  YOU HAD AN ENDOSCOPIC PROCEDURE TODAY AT THE [facility]:   Refer to the procedure report that was given to you for any specific questions about what was found during the examination.  If the procedure report does not answer your questions, please call your gastroenterologist to clarify.  If you requested that your care partner not be given the details of your procedure findings, then the procedure report has been included in a sealed envelope for you to review at your convenience later.  YOU SHOULD EXPECT: Some feelings of bloating in the abdomen. Passage of more gas than usual.  Walking can help get rid of the air that was put into your GI tract during the procedure and reduce the bloating. If you had a lower endoscopy (such as a colonoscopy or flexible sigmoidoscopy) you may notice spotting of blood in your stool or on the toilet paper. If you underwent a bowel prep for your procedure, you may not have a normal bowel movement for a few days.  Please Note:  You might notice some irritation and congestion in your nose or some drainage.  This is from the oxygen used during your procedure.  There is no need for concern and it should clear up in a day or so.  SYMPTOMS TO REPORT IMMEDIATELY:  Following lower endoscopy (colonoscopy or flexible sigmoidoscopy):  Excessive amounts of blood in the stool  Significant tenderness or worsening of abdominal pains  Swelling of the abdomen that is new, acute  Fever of 100F or higher  For urgent or emergent issues, a gastroenterologist can be reached at any hour by calling [phone]. Do not use MyChart messaging for urgent concerns.    DIET:  We do recommend a small meal at first, but then you may proceed to your regular diet.  Drink plenty of fluids but you should avoid alcoholic beverages for 24 hours.  ACTIVITY:  You should plan to take it easy  for the rest of today and you should NOT DRIVE or use heavy machinery until tomorrow (because of the sedation medicines used during the test).    FOLLOW UP: Our staff will call the number listed on your records the next business day following your procedure.  We will call around 7:15- 8:00 am to check on you and address any questions or concerns that you may have regarding the information given to you following your procedure. If we do not reach you, we will leave a message.     If any biopsies were taken you will be contacted by phone or by letter within the next 1-3 weeks.  Please call us at [phone] if you have not heard about the biopsies in 3 weeks.    SIGNATURES/CONFIDENTIALITY: You and/or your care partner have signed paperwork which will be entered into your electronic medical record.  These signatures attest to the fact that that the information above on your After Visit Summary has been reviewed and is understood.  Full responsibility of the confidentiality of this discharge information lies with you and/or your care-partner.

## 2024-07-18 NOTE — Progress Notes (Signed)
 Vss nad trans to pacu

## 2024-07-19 ENCOUNTER — Telehealth: Payer: Self-pay | Admitting: *Deleted

## 2024-07-19 NOTE — Telephone Encounter (Signed)
  Follow up Call-      No data to display           Patient questions:  Message left to call us if necessary.

## 2024-07-23 ENCOUNTER — Ambulatory Visit: Payer: Self-pay | Admitting: Internal Medicine

## 2024-07-23 LAB — SURGICAL PATHOLOGY

## 2024-07-30 ENCOUNTER — Ambulatory Visit: Admitting: Internal Medicine

## 2024-08-16 ENCOUNTER — Telehealth: Payer: Self-pay

## 2024-08-16 NOTE — Telephone Encounter (Signed)
 Copied from CRM #[id number]. Topic: Clinical - Lab/Test Results >> Aug 16, 2024 12:36 PM Avram MATSU wrote: Reason for CRM: patient got a colonoscopy on 8/14 and have not got their results. Was told after two week she will hear back. Patient stated she can not reach the office. Please advise [phone]

## 2024-08-19 NOTE — Telephone Encounter (Signed)
 2 polyps were removed from the colon.  Please see Dr. Lafonda letter in letter section of EMR. Read the letter to pt.

## 2024-08-19 NOTE — Telephone Encounter (Signed)
 Called & spoke to the patient. Verified name & DOB. Informed patient of phone number to [facility] but patient stated that she has tried contacting them multiple times but there has been to call back even after leaving a message. Please advise if you have received results for her colonoscopy.

## 2024-08-20 NOTE — Telephone Encounter (Signed)
 Called & spoke to the patient. Verified name & DOB. Informed of results & recommendations per Dr.Dorsey's letter. Patient expressed verbal understanding of all discussed. No further questions / concerns at this time.

## 2024-09-12 ENCOUNTER — Ambulatory Visit: Attending: Internal Medicine | Admitting: Internal Medicine

## 2024-09-12 ENCOUNTER — Other Ambulatory Visit (HOSPITAL_COMMUNITY)
Admission: RE | Admit: 2024-09-12 | Discharge: 2024-09-12 | Disposition: A | Discharge status: Home / Self Care | Source: Ambulatory Visit | Attending: Internal Medicine | Admitting: Internal Medicine

## 2024-09-12 ENCOUNTER — Encounter: Payer: Self-pay | Admitting: Internal Medicine

## 2024-09-12 VITALS — BP 129/80 | HR 79 | Temp 97.8°F | Ht <= 58 in | Wt 166.0 lb

## 2024-09-12 DIAGNOSIS — Z2821 Immunization not carried out because of patient refusal: Secondary | ICD-10-CM

## 2024-09-12 DIAGNOSIS — D126 Benign neoplasm of colon, unspecified: Secondary | ICD-10-CM

## 2024-09-12 DIAGNOSIS — Z124 Encounter for screening for malignant neoplasm of cervix: Secondary | ICD-10-CM | POA: Diagnosis not present

## 2024-09-12 DIAGNOSIS — Z1231 Encounter for screening mammogram for malignant neoplasm of breast: Secondary | ICD-10-CM | POA: Diagnosis not present

## 2024-09-12 DIAGNOSIS — E785 Hyperlipidemia, unspecified: Secondary | ICD-10-CM | POA: Diagnosis not present

## 2024-09-12 NOTE — Patient Instructions (Signed)
 Please return to the lab tomorrow or next week to have blood test done to check your liver function. We monitor this intermittently while you are on cholesterol lowering medication.  You will be called with results of the pap smear that was done today.

## 2024-09-12 NOTE — Progress Notes (Signed)
 Patient ID: Catherine Patrick, female    DOB: May 04, 1966  MRN: [id number]  CC: Gynecologic Exam (Pap. /No questions / concerns/No to all vax. Yes to mammogram.)   Subjective: Catherine Patrick is a 58 y.o. female who presents for chronic ds management. Her concerns today include:  Patient with previous history of TIA, HTN, obesity, urolithiasis, HL   Discussed the use of AI scribe software for clinical note transcription with the patient, who gave verbal consent to proceed.  History of Present Illness   GYN History:  Pt is G3P3 Any hx of abn paps?: no Menses regular or irregular?: postmenopausal How long does menses last?  Menstrual flow light or heavy?:  Method of birth control?:  NA Any vaginal dischg at this time?:  no Dysuria?: no Any hx of STI?: no Sexually active with how many partners: not sexually active Desires STI screen: no Last MMG: pt now agreeable to having MMG after discussion on last visit. Hx of breast bx LT breast in past Family hx of uterine, cervical or breast cancer?:  no  Had pos cologuard with subsequent colonoscopy where she had a few adenomatous polyps removed.  She will be due for colonoscopy again in 3 years per gastroenterology.  Hyperlipidemia/Hx TIA: She reports taking and tolerating the atorvastatin .  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 90 tablet 0   Cholecalciferol  (VITAMIN D -3) 25 MCG (1000 UT) CAPS Take 1,000 Units by mouth daily.     No current facility-administered medications on file prior to visit.    Allergies  Allergen Reactions   Porcine (Pork) Protein-Containing Drug Products     Social History   Socioeconomic History   Marital status: Single    Spouse name: Not on file   Number of children: Not on file   Years of education: Not on file   Highest education level: Not on file  Occupational History   Not on file  Tobacco Use   Smoking status: Never   Smokeless tobacco: Never  Vaping Use   Vaping status: Never Used  Substance and Sexual Activity   Alcohol use: Never   Drug use: Never   Sexual activity: Yes    Birth control/protection: None, Post-menopausal  Other Topics Concern   Not on file  Social History Narrative   Not on file   Social Drivers of Health   Financial Resource Strain: High Risk (05/24/2024)   Overall Financial Resource Strain (CARDIA)    Difficulty of Paying Living Expenses: Very hard  Food Insecurity: No Food Insecurity (05/24/2024)   Hunger Vital Sign    Worried About Running Out of Food in the  Last Year: Never true    Ran Out of Food in the Last Year: Never true  Transportation Needs: No Transportation Needs (05/24/2024)   PRAPARE - Administrator, Civil Service (Medical): No    Lack of Transportation (Non-Medical): No  Physical Activity: Inactive (05/24/2024)   Exercise Vital Sign    Days of Exercise per Week: 0 days    Minutes of Exercise per Session: 0 min  Stress: No Stress Concern Present (05/24/2024)   [name] of Occupational Health - Occupational Stress Questionnaire    Feeling of Stress: Not at all   Social Connections: Moderately Integrated (05/24/2024)   Social Connection and Isolation Panel    Frequency of Communication with Friends and Family: More than three times a week    Frequency of Social Gatherings with Friends and Family: More than three times a week    Attends Religious Services: More than 4 times per year    Active Member of [employer] or Organizations: Yes    Attends Banker Meetings: 1 to 4 times per year    Marital Status: Divorced  Catering manager Violence: Not At Risk (05/24/2024)   Humiliation, Afraid, Rape, and Kick questionnaire    Fear of Current or Ex-Partner: No    Emotionally Abused: No    Physically Abused: No    Sexually Abused: No    Family History  Problem Relation Age of Onset   Diabetes Father    Kidney disease Father    Diabetes Brother    Cancer Neg Hx    Heart failure Neg Hx    Hyperlipidemia Neg Hx    Hypertension Neg Hx    Colon cancer Neg Hx    Esophageal cancer Neg Hx    Rectal cancer Neg Hx    Stomach cancer Neg Hx     Past Surgical History:  Procedure Laterality Date   CESAREAN SECTION     CESAREAN SECTION     HERNIA REPAIR     LIPOSUCTION  2002   TOTAL KNEE ARTHROPLASTY Right 11/30/2016   Procedure: RIGHT TOTAL KNEE ARTHROPLASTY;  Surgeon: Kay CHRISTELLA Cummins, MD;  Location: [facility] OR;  Service: Orthopedics;  Laterality: Right;   TOTAL KNEE ARTHROPLASTY Left 03/16/2017   Procedure: LEFT TOTAL KNEE ARTHROPLASTY;  Surgeon: Kay CHRISTELLA Cummins, MD;  Location: [facility] OR;  Service: Orthopedics;  Laterality: Left;    ROS: Review of Systems Negative except as stated above  PHYSICAL EXAM: BP 129/80   Pulse 79   Temp 97.8 F (36.6 C) (Oral)   Ht 4' 9 (1.448 m)   Wt 166 lb (75.3 kg)   LMP 11/18/2013   SpO2 97%   BMI 35.92 kg/m   Physical Exam  General appearance - alert, well appearing, and in no distress Mental status - normal mood, behavior, speech, dress, motor activity, and thought processes Breasts - CMA Clarissa present for  breast and pelvic exam: breasts appear normal, no suspicious masses, no skin or nipple changes or axillary nodes Pelvic -no external vaginal lesion. Cervix looks a little friable with slight bleeding with use of cervical brush.      Latest Ref Rng & Units 05/24/2024   10:38 AM 01/12/2022   12:02 AM 03/31/2020   12:12 PM  CMP  Glucose 70 - 99 mg/dL 83  83  98   BUN 8 - 27 mg/dL 22  17  18    Creatinine 0.57 - 1.00 mg/dL 9.32  9.52  9.35   Sodium 134 -  144 mmol/L 140  139  138   Potassium 3.5 - 5.2 mmol/L 4.3  3.9  3.7   Chloride 96 - 106 mmol/L 104  106  103   CO2 20 - 29 mmol/L 21  26  25    Calcium  8.7 - 10.3 mg/dL 9.3  9.4  8.8   Total Protein 6.0 - 8.5 g/dL 6.7  7.5  7.6   Total Bilirubin 0.0 - 1.2 mg/dL 0.2  0.5  0.5   Alkaline Phos 44 - 121 IU/L 138  104  120   AST 0 - 40 IU/L 19  25  28    ALT 0 - 32 IU/L 15  19  25     Lipid Panel     Component Value Date/Time   CHOL 196 05/24/2024 1038   TRIG 149 05/24/2024 1038   HDL 54 05/24/2024 1038   CHOLHDL 3.6 05/24/2024 1038   CHOLHDL 3.3 01/12/2022 0809   VLDL 13 01/12/2022 0809   LDLCALC 116 (H) 05/24/2024 1038    CBC    Component Value Date/Time   WBC 8.0 05/24/2024 1038   WBC 8.8 01/12/2022 0002   RBC 4.46 05/24/2024 1038   RBC 4.82 01/12/2022 0002   HGB 13.5 05/24/2024 1038   HCT 41.9 05/24/2024 1038   PLT 196 05/24/2024 1038   MCV 94 05/24/2024 1038   MCH 30.3 05/24/2024 1038   MCH 29.5 01/12/2022 0002   MCHC 32.2 05/24/2024 1038   MCHC 32.1 01/12/2022 0002   RDW 13.2 05/24/2024 1038   LYMPHSABS 3.9 01/12/2022 0002   LYMPHSABS 3.9 (H) 01/28/2019 1044   MONOABS 0.6 01/12/2022 0002   EOSABS 0.1 01/12/2022 0002   EOSABS 0.1 01/28/2019 1044   BASOSABS 0.1 01/12/2022 0002   BASOSABS 0.0 01/28/2019 1044    ASSESSMENT AND PLAN: 1. Pap smear for cervical cancer screening (Primary) - Cytology - PAP  2. Encounter for screening mammogram for malignant neoplasm of breast - MM 3D SCREENING MAMMOGRAM BILATERAL BREAST;  Future  3. Hyperlipidemia, unspecified hyperlipidemia type Continue Lipitor.  The lab is currently closed.  Patient is agreeable to returning to the lab tomorrow or some day next week to have LFTs done.  Advised that she does not need an appointment to come to the lab. - Hepatic Function Panel; Future  4. Influenza vaccination declined  5. Adenomatous polyp of colon, unspecified part of colon Due for repeat c-scope in 3 yrs   Patient was given the opportunity to ask questions.  Patient verbalized understanding of the plan and was able to repeat key elements of the plan.   This documentation was completed using Paediatric nurse.  Any transcriptional errors are unintentional.  Orders Placed This Encounter  Procedures   MM 3D SCREENING MAMMOGRAM BILATERAL BREAST   Hepatic Function Panel     Requested Prescriptions    No prescriptions requested or ordered in this encounter    Return in about 6 months (around 03/13/2025).  Barnie Louder, MD, FACP

## 2024-09-17 LAB — CYTOLOGY - PAP
Comment: NEGATIVE
Diagnosis: NEGATIVE
High risk HPV: NEGATIVE

## 2024-09-18 ENCOUNTER — Ambulatory Visit: Payer: Self-pay | Admitting: Internal Medicine

## 2025-03-13 ENCOUNTER — Ambulatory Visit: Admitting: Internal Medicine
